# Patient Record
Sex: Male | Born: 1960 | Smoking: Former smoker
Health system: Southern US, Community
[De-identification: ages and names within clinical notes are randomized; demographics above are authoritative.]

## PROBLEM LIST (undated history)

## (undated) DIAGNOSIS — M21372 Foot drop, left foot: Secondary | ICD-10-CM

## (undated) DIAGNOSIS — D6851 Activated protein C resistance: Secondary | ICD-10-CM

## (undated) DIAGNOSIS — J449 Chronic obstructive pulmonary disease, unspecified: Secondary | ICD-10-CM

## (undated) DIAGNOSIS — G8929 Other chronic pain: Secondary | ICD-10-CM

## (undated) DIAGNOSIS — M519 Unspecified thoracic, thoracolumbar and lumbosacral intervertebral disc disorder: Secondary | ICD-10-CM

## (undated) DIAGNOSIS — J309 Allergic rhinitis, unspecified: Secondary | ICD-10-CM

## (undated) DIAGNOSIS — M5416 Radiculopathy, lumbar region: Secondary | ICD-10-CM

## (undated) DIAGNOSIS — E785 Hyperlipidemia, unspecified: Secondary | ICD-10-CM

## (undated) DIAGNOSIS — J45909 Unspecified asthma, uncomplicated: Secondary | ICD-10-CM

## (undated) HISTORY — DX: Radiculopathy, lumbar region: M54.16

## (undated) HISTORY — DX: Hyperlipidemia, unspecified: E78.5

## (undated) HISTORY — DX: Unspecified asthma, uncomplicated: J45.909

## (undated) HISTORY — DX: Unspecified thoracic, thoracolumbar and lumbosacral intervertebral disc disorder: M51.9

## (undated) HISTORY — DX: Foot drop, left foot: M21.372

## (undated) HISTORY — DX: Activated protein C resistance: D68.51

## (undated) HISTORY — DX: Allergic rhinitis, unspecified: J30.9

## (undated) HISTORY — DX: Chronic obstructive pulmonary disease, unspecified: J44.9

## (undated) HISTORY — DX: Other chronic pain: G89.29

---

## 2010-12-31 HISTORY — PX: SPINE SURGERY: SHX786

## 2011-11-29 ENCOUNTER — Encounter: Payer: Self-pay | Admitting: Physical Medicine & Rehabilitation

## 2011-12-11 ENCOUNTER — Ambulatory Visit (HOSPITAL_BASED_OUTPATIENT_CLINIC_OR_DEPARTMENT_OTHER): Payer: Worker's Compensation | Admitting: Physical Medicine & Rehabilitation

## 2011-12-11 ENCOUNTER — Encounter: Payer: Worker's Compensation | Attending: Physical Medicine & Rehabilitation

## 2011-12-11 ENCOUNTER — Encounter: Payer: Self-pay | Admitting: Physical Medicine & Rehabilitation

## 2011-12-11 VITALS — BP 119/69 | HR 72 | Ht 68.0 in | Wt 177.4 lb

## 2011-12-11 DIAGNOSIS — M961 Postlaminectomy syndrome, not elsewhere classified: Secondary | ICD-10-CM | POA: Insufficient documentation

## 2011-12-11 DIAGNOSIS — M5126 Other intervertebral disc displacement, lumbar region: Secondary | ICD-10-CM | POA: Insufficient documentation

## 2011-12-11 DIAGNOSIS — M79609 Pain in unspecified limb: Secondary | ICD-10-CM | POA: Insufficient documentation

## 2011-12-11 MED ORDER — TRAMADOL HCL 50 MG PO TABS: 100.0000 mg | ORAL_TABLET | Freq: Three times a day (TID) | ORAL | Status: AC | PRN

## 2011-12-11 MED ORDER — TRAMADOL HCL 50 MG PO TABS: 100.0000 mg | ORAL_TABLET | Freq: Three times a day (TID) | ORAL | Status: DC | PRN

## 2011-12-11 MED ORDER — GABAPENTIN 100 MG PO CAPS: 100.0000 mg | ORAL_CAPSULE | Freq: Three times a day (TID) | ORAL | Status: DC

## 2011-12-11 NOTE — Patient Instructions (Signed)
I have made a referral to physical therapy. His will need to be approved by workers compensation. The location will be at [facility] We will try tramadol this would be in place of oxycodone. We will try gabapentin for leg pain

## 2011-12-11 NOTE — Addendum Note (Signed)
Addended by: Caryl Ada on: 12/11/2011 02:11 PM   Modules accepted: Orders

## 2011-12-11 NOTE — Addendum Note (Signed)
Addended by: Judd Gaudier on: 12/11/2011 11:48 AM   Modules accepted: Orders

## 2011-12-11 NOTE — Progress Notes (Addendum)
Subjective:    Patient ID: Cody Grant, male    DOB: [birth year], 51 y.o.   MRN: [id number]  HPI 8 a work related injury July of 2011. Back pain and left leg pain and left foot numbness. Tried epidural injections x3 prior to surgery without significant relief. When onto L3-L4 discectomy in September of 2012. Postoperatively his back pain got better however his left leg pain became more severe and more constant. Has tried Lyrica as well as amitriptyline. Takes 0-2 oxycodone per day. Has had relief with physical therapy in the past.  Prior to moving to [place] had another order for physical therapy from his physician in [place]. Patient is now residing in [place] permanently. Last MRI of the lumbar spine was 07/19/2011 this was reviewed showing postsurgical changes with enhancing granulation tissue around the left L3 nerve root, in addition there was a left foraminal disc protrusion at L5-S1 level which was unchanged prior to a study dated November 08 2010 EMG/NCV dated 01/18/2010 demonstrated L5-S1 radiculopathy on the report however it failed to demonstrate to limb muscles a different peripheral nerve distribution with similar findings. Therefore he did not meet AAEM criteria Cody Grant viewed records including the diagnostic testing noted above. There was no FCE included with the packet Pain Inventory Average Pain 5 Pain Right Now 6 My pain is stabbing, tingling, aching and varies b/t constant and intermittent  In the last 24 hours, has pain interfered with the following? General activity 4 Relation with others 4 Enjoyment of life 4 What TIME of day is your pain at its worst? morning and evening Sleep (in general) Fair  Pain is worse with: bending, sitting and standing Pain improves with: rest, heat/ice and medication Relief from Meds: 6  Mobility walk without assistance how many minutes can you walk? 20 ability to climb steps?  yes do you drive?  yes  Function disabled: date  disabled 2012 I need assistance with the following:  meal prep, household duties and shopping  Neuro/Psych numbness tingling  Prior Studies Any changes since last visit?  no  Physicians involved in your care Any changes since last visit?  no   Family History  Problem Relation Age of Onset  . COPD Mother   . Diabetes Mother    History   Social History  . Marital Status: Unknown    Spouse Name: N/A    Number of Children: N/A  . Years of Education: N/A   Social History Main Topics  . Smoking status: Current Everyday Smoker -- 1.0 packs/day for 30 years    Types: Cigarettes  . Smokeless tobacco: Never Used  . Alcohol Use: None  . Drug Use: None  . Sexually Active: None   Other Topics Concern  . None   Social History Narrative  . None   Past Surgical History  Procedure Date  . Spine surgery 12/2010    micro diskectomy   Past Medical History  Diagnosis Date  . Asthma   . Factor 5 Leiden mutation, heterozygous   . Clotting disorder     "has 1 count of factor 5 "   BP 119/69  Pulse 72  Ht 5\' 8"  (1.727 m)  Wt 177 lb 6.4 oz (80.468 kg)  BMI 26.97 kg/m2  SpO2 97%   Review of Systems  Respiratory: Positive for cough, shortness of breath and wheezing.   Musculoskeletal: Positive for back pain.       Hip pain  Neurological: Positive for numbness.  Tingling  All other systems reviewed and are negative.       Objective:   Physical Exam  Constitutional: He is oriented to person, place, and time. He appears well-developed and well-nourished.  HENT:  Head: Normocephalic and atraumatic.  Eyes: Conjunctivae are normal. Pupils are equal, round, and reactive to light.  Musculoskeletal:       Pain in the left sacroiliac joint with left hip range of motion  Neurological: He is alert and oriented to person, place, and time. He displays abnormal reflex. A cranial nerve deficit and sensory deficit is present. Coordination and gait abnormal.  Reflex Scores:       Tricep reflexes are 2+ on the right side and 2+ on the left side.      Bicep reflexes are 2+ on the right side and 2+ on the left side.      Brachioradialis reflexes are 2+ on the right side and 2+ on the left side.      Patellar reflexes are 2+ on the right side and 2+ on the left side.      Achilles reflexes are 2+ on the right side and 2+ on the left side.      Decreased sensation to pinprick left L5 and S1          Assessment & Plan:  #1. Lumbar postlaminectomy syndrome. The major problem is with nerve related pain in the left leg and foot. Has tried amitriptyline as well as Lyrica for this. Has not tried gabapentin. I do think that this would be a good choice for a trial. I also agree with physical therapy and I will make a referral [facility].also the patient has not been taking much oxycodone. I think it's reasonable to trial him on tramadol. We'll check urine drug screen in the event that we need to resort to narcotic analgesics. We also discussed the possibility of spinal cord stimulation trial. Certainly this is a reasonable option. We'll give the patient further information. If he fails the above we may consider this.  No further diagnostic testing needed at this time

## 2011-12-18 ENCOUNTER — Ambulatory Visit: Payer: Worker's Compensation | Admitting: Physical Therapy

## 2011-12-20 ENCOUNTER — Ambulatory Visit: Payer: Worker's Compensation | Attending: Physical Medicine & Rehabilitation | Admitting: Physical Therapy

## 2011-12-20 DIAGNOSIS — M545 Low back pain, unspecified: Secondary | ICD-10-CM | POA: Insufficient documentation

## 2011-12-20 DIAGNOSIS — M79609 Pain in unspecified limb: Secondary | ICD-10-CM | POA: Insufficient documentation

## 2011-12-20 DIAGNOSIS — IMO0001 Reserved for inherently not codable concepts without codable children: Secondary | ICD-10-CM | POA: Insufficient documentation

## 2011-12-27 ENCOUNTER — Ambulatory Visit: Payer: Worker's Compensation | Admitting: Physical Therapy

## 2011-12-28 ENCOUNTER — Ambulatory Visit (HOSPITAL_BASED_OUTPATIENT_CLINIC_OR_DEPARTMENT_OTHER): Payer: Worker's Compensation | Admitting: Physical Medicine & Rehabilitation

## 2011-12-28 ENCOUNTER — Encounter: Payer: Self-pay | Admitting: Physical Medicine & Rehabilitation

## 2011-12-28 VITALS — BP 126/81 | HR 91 | Resp 14 | Ht 68.0 in | Wt 173.0 lb

## 2011-12-28 DIAGNOSIS — M961 Postlaminectomy syndrome, not elsewhere classified: Secondary | ICD-10-CM

## 2011-12-28 NOTE — Patient Instructions (Signed)
Gabapentin 100mg  po twice a day for a week then 3 times a day  Look at Spinal cord stim DVD  Continue tramadol two tabs twice a day  Try ibuprofen 1 600mg  with one 200mg   , avoid taking everyday  See me in 45month

## 2011-12-28 NOTE — Progress Notes (Signed)
Subjective:    Patient ID: Cody Grant, male    DOB: 06-29-1960, 51 y.o.   MRN: [id number] work related injury July of 2011. Back pain and left leg pain and left foot numbness. Tried epidural injections x3 prior to surgery without significant relief. When onto L3-L4 discectomy in September of 2012. Postoperatively his back pain got better however his left leg pain became more severe and more constant.  Has tried Lyrica as well as amitriptyline. Takes 0-2 oxycodone per day.  Has had relief with physical therapy in the past. Prior to moving to [place] had another order for physical therapy from his physician in [place].  Patient is now residing in [place] permanently.  Last MRI of the lumbar spine was 07/19/2011 this was reviewed showing postsurgical changes with enhancing granulation tissue around the left L3 nerve root, in addition there was a left foraminal disc protrusion at L5-S1 level which was unchanged prior to a study dated November 08 2010  EMG/NCV dated 01/18/2010 demonstrated L5-S1 radiculopathy on the report however it failed to demonstrate to limb muscles a different peripheral nerve distribution with similar findings. Therefore he did not meet AAEM criteria  Gardiner Rhyme viewed records including the diagnostic testing noted above. There was no FCE included with the packet  HPI Patient did not start his Neurontin until 2 days ago. He was treated for respiratory infection was taking multiple medications . Current Neurontin makes him feel a bit drowsy however the pain has improved with that. I instructed him to take the Neurontin 3 times a day for the first week and then increase to 3 times a day.He has started physical therapy has had a couple visits.so far the therapy has been helpful Pain Inventory Average Pain 5 Pain Right Now 6 My pain is stabbing, tingling and aching  In the last 24 hours, has pain interfered with the following? General activity 6 Relation with others 5 Enjoyment of  life 7 What TIME of day is your pain at its worst? evening Sleep (in general) Poor  Pain is worse with: bending, sitting and standing Pain improves with: rest, heat/ice, therapy/exercise, pacing activities, medication and TENS Relief from Meds: 5  Mobility walk without assistance how many minutes can you walk? 15 ability to climb steps?  yes do you drive?  yes  Function not employed: date last employed 11/16/09 disabled: date disabled 05/19/2010 I need assistance with the following:  dressing, meal prep and household duties Do you have any goals in this area?  no  Neuro/Psych numbness tingling  Prior Studies Any changes since last visit?  no  Physicians involved in your care Any changes since last visit?  no   Family History  Problem Relation Age of Onset  . COPD Mother   . Diabetes Mother    History   Social History  . Marital Status: Unknown    Spouse Name: N/A    Number of Children: N/A  . Years of Education: N/A   Social History Main Topics  . Smoking status: Current Everyday Smoker -- 1.0 packs/day for 30 years    Types: Cigarettes  . Smokeless tobacco: Never Used  . Alcohol Use: None  . Drug Use: None  . Sexually Active: None   Other Topics Concern  . None   Social History Narrative  . None   Past Surgical History  Procedure Date  . Spine surgery 12/2010    micro diskectomy   Past Medical History  Diagnosis Date  . Asthma   .  Factor 5 Leiden mutation, heterozygous   . Clotting disorder     "has 1 count of factor 5 "   BP 126/81  Pulse 91  Resp 14  Ht 5\' 8"  (1.727 m)  Wt 173 lb (78.472 kg)  BMI 26.30 kg/m2  SpO2 95%     Review of Systems  Respiratory: Positive for wheezing.   Musculoskeletal: Positive for myalgias, back pain and arthralgias.  Neurological: Positive for numbness.  All other systems reviewed and are negative.       Objective:   Physical Exam  Physical Exam  Constitutional: He is oriented to person, place,  and time. He appears well-developed and well-nourished.  HENT:  Head: Normocephalic and atraumatic.  Eyes: Conjunctivae are normal. Pupils are equal, round, and reactive to light.  Musculoskeletal:  No tenderness to palpation in the lumbar spine. Well-healed incision Neurological: He is alert and oriented to person, place, and time. He displays abnormal reflex. A cranial nerve deficit and sensory deficit is present. Coordination and gait abnormal.  Reflex Scores:  Tricep reflexes are 2+ on the right side and 2+ on the left side.  Bicep reflexes are 2+ on the right side and 2+ on the left side.  Brachioradialis reflexes are 2+ on the right side and 2+ on the left side.  Patellar reflexes are 2+ on the right side and 2+ on the left side.  Achilles reflexes are 2+ on the right side and 2+ on the left side. Decreased sensation to pinprick left L5 and S1        Assessment & Plan:  #1. Lumbar postlaminectomy syndrome. The major problem is with nerve related pain in the left leg and foot. Has tried amitriptyline as well as Lyrica for this. Has not tried gabapentin. I do think that this would be a good choice for a trial. I also agree with physical therapy and I will make a referral [facility].also the patient has not been taking much oxycodone. I think it's reasonable to trial him on tramadol. We'll check urine drug screen in the event that we need to resort to narcotic analgesics.  We also discussed the possibility of spinal cord stimulation trial. Certainly this is a reasonable option. We'll give the patient further information. If he fails the above we may consider this.  No further diagnostic testing needed at this time Surgery considered the L5-S1 protrusion however stated it was improved. The patient has had chronic nerve compression so decompression unlikely to be of benefit. DVT for spinal cord stimulation given to patient today Reduce Neurontin to twice a day for 1 week then increase to  3 times a day. We may increase his nighttime dose at some point Continue tramadol 2 by mouth twice a day Trial of ibuprofen 800 mg on a when necessary basis I advised not to take on a daily basis Continue physical therapy I'll see the patient back in one month

## 2011-12-29 ENCOUNTER — Ambulatory Visit: Payer: Worker's Compensation | Admitting: Physical Therapy

## 2012-01-03 ENCOUNTER — Ambulatory Visit: Payer: Worker's Compensation | Attending: Physical Medicine & Rehabilitation | Admitting: Physical Therapy

## 2012-01-03 DIAGNOSIS — M79609 Pain in unspecified limb: Secondary | ICD-10-CM | POA: Insufficient documentation

## 2012-01-03 DIAGNOSIS — IMO0001 Reserved for inherently not codable concepts without codable children: Secondary | ICD-10-CM | POA: Insufficient documentation

## 2012-01-03 DIAGNOSIS — M545 Low back pain, unspecified: Secondary | ICD-10-CM | POA: Insufficient documentation

## 2012-01-05 ENCOUNTER — Ambulatory Visit: Payer: Worker's Compensation | Admitting: Physical Therapy

## 2012-01-10 ENCOUNTER — Ambulatory Visit: Payer: Worker's Compensation | Admitting: Physical Therapy

## 2012-01-11 ENCOUNTER — Encounter: Payer: Worker's Compensation | Admitting: Physical Therapy

## 2012-01-12 ENCOUNTER — Ambulatory Visit: Payer: Worker's Compensation | Admitting: Physical Therapy

## 2012-01-16 ENCOUNTER — Ambulatory Visit: Payer: Worker's Compensation | Admitting: Physical Therapy

## 2012-01-17 ENCOUNTER — Ambulatory Visit: Payer: Worker's Compensation | Admitting: Physical Therapy

## 2012-01-19 ENCOUNTER — Ambulatory Visit: Payer: Worker's Compensation | Admitting: Physical Therapy

## 2012-01-24 ENCOUNTER — Ambulatory Visit: Payer: Worker's Compensation | Admitting: Physical Therapy

## 2012-01-26 ENCOUNTER — Encounter: Payer: Worker's Compensation | Attending: Physical Medicine & Rehabilitation

## 2012-01-26 ENCOUNTER — Ambulatory Visit: Payer: Worker's Compensation | Admitting: Physical Therapy

## 2012-01-26 ENCOUNTER — Ambulatory Visit (HOSPITAL_BASED_OUTPATIENT_CLINIC_OR_DEPARTMENT_OTHER): Payer: Worker's Compensation | Admitting: Physical Medicine & Rehabilitation

## 2012-01-26 ENCOUNTER — Encounter: Payer: Self-pay | Admitting: Physical Medicine & Rehabilitation

## 2012-01-26 VITALS — HR 72 | Resp 14 | Ht 68.0 in | Wt 171.0 lb

## 2012-01-26 DIAGNOSIS — M79609 Pain in unspecified limb: Secondary | ICD-10-CM | POA: Insufficient documentation

## 2012-01-26 DIAGNOSIS — M5126 Other intervertebral disc displacement, lumbar region: Secondary | ICD-10-CM | POA: Insufficient documentation

## 2012-01-26 DIAGNOSIS — M961 Postlaminectomy syndrome, not elsewhere classified: Secondary | ICD-10-CM | POA: Insufficient documentation

## 2012-01-26 MED ORDER — TRAMADOL HCL 50 MG PO TABS: 100.0000 mg | ORAL_TABLET | Freq: Three times a day (TID) | ORAL | Status: DC | PRN

## 2012-01-26 MED ORDER — IBUPROFEN 400 MG PO TABS: 400.0000 mg | ORAL_TABLET | Freq: Three times a day (TID) | ORAL | Status: DC | PRN

## 2012-01-26 NOTE — Patient Instructions (Signed)
Continue your home physical therapy exercise program Ibuprofen 400 mg 3 times per day as needed with food, if this is not helpful we may have to go up to the 600s Increase her tramadol to 2 tablets 3 times per day. I will need to get results of your urine drug screen prior to prescribing anything stronger than this

## 2012-01-26 NOTE — Progress Notes (Signed)
Subjective:    Patient ID: Cody Grant, male    DOB: 1960-06-22, 51 y.o.   MRN: [id number]  HPI Physical therapy did not reduce his pain scores however he felt a little bit better after doing it. He states that in [place] he alternating ibuprofen with hydrocodone or oxycodone. He did not take either medicine on a daily basis. He is currently taking ibuprofen. He has tried tramadol which was not particularly helpful for him We had a urine drug screen ordered last visit but this was not obtained although the patient states that he did give a sample last time He states he drinks about 3 beers per day. I counseled him to reduce that. If we are to prescribe narcotics for this patient he will not be allowed to drink. This is because of a negative interaction. Pain Inventory Average Pain 5 Pain Right Now 6 My pain is constant, stabbing and tingling  In the last 24 hours, has pain interfered with the following? General activity 6 Relation with others 4 Enjoyment of life 6 What TIME of day is your pain at its worst? All Day Sleep (in general) Fair  Pain is worse with: unsure Pain improves with: Nothing Relief from Meds: 0  Mobility walk without assistance  Function disabled: date disabled 2012 I need assistance with the following:  household duties  Neuro/Psych numbness tingling  Prior Studies Any changes since last visit?  no  Physicians involved in your care Any changes since last visit?  no   Family History  Problem Relation Age of Onset  . COPD Mother   . Diabetes Mother    History   Social History  . Marital Status: Unknown    Spouse Name: N/A    Number of Children: N/A  . Years of Education: N/A   Social History Main Topics  . Smoking status: Current Every Day Smoker -- 1.0 packs/day for 30 years    Types: Cigarettes  . Smokeless tobacco: Never Used  . Alcohol Use: None  . Drug Use: None  . Sexually Active: None   Other Topics Concern  . None    Social History Narrative  . None   Past Surgical History  Procedure Date  . Spine surgery 12/2010    micro diskectomy   Past Medical History  Diagnosis Date  . Asthma   . Factor 5 Leiden mutation, heterozygous   . Clotting disorder     "has 1 count of factor 5 "   Pulse 72  Resp 14  Ht 5\' 8"  (1.727 m)  Wt 171 lb (77.565 kg)  BMI 26.00 kg/m2  SpO2 97%      Review of Systems  Constitutional: Negative.   HENT: Negative.   Eyes: Negative.   Respiratory: Negative.   Cardiovascular: Negative.   Gastrointestinal: Negative.   Genitourinary: Negative.   Musculoskeletal: Negative.   Skin: Negative.   Neurological: Positive for numbness.  Hematological: Negative.   Psychiatric/Behavioral: Negative.        Objective:   Physical Exam  Constitutional: He is oriented to person, place, and time. He appears well-developed and well-nourished.  Musculoskeletal:       Lumbar back: He exhibits decreased range of motion.       No tenderness with Faber's maneuver  Neurological: He is alert and oriented to person, place, and time. He displays no atrophy. A sensory deficit is present. He exhibits normal muscle tone. Gait normal.  Reflex Scores:      Tricep reflexes  are 2+ on the right side and 2+ on the left side.      Bicep reflexes are 2+ on the right side and 2+ on the left side.      Brachioradialis reflexes are 2+ on the right side and 2+ on the left side.      Patellar reflexes are 2+ on the right side and 2+ on the left side.      Achilles reflexes are 2+ on the right side and 2+ on the left side.      Decreased left L5 dermatome to pinprick  Psychiatric: He has a normal mood and affect.          Assessment & Plan:  1. Lumbar post laminectomy syndrome Status post L3-L4 laminectomy and discectomy.  Has some evidence of L5 radiculopathy as well. Left foraminal disc protrusion which is known. No surgical intervention was recommended. Patient requesting narcotic  analgesics will need to check urine drug screen. Will need to counsel on alcohol cessation if we are to prescribe.  In the meantime tramadol 100 g 3 times a day Ibuprofen 400 mg 3 times a day when necessary with food Once again discussed spinal cord stimulator he declines

## 2012-01-31 ENCOUNTER — Encounter: Payer: Worker's Compensation | Admitting: Physical Therapy

## 2012-02-20 ENCOUNTER — Ambulatory Visit (HOSPITAL_BASED_OUTPATIENT_CLINIC_OR_DEPARTMENT_OTHER): Payer: Worker's Compensation | Admitting: Physical Medicine & Rehabilitation

## 2012-02-20 ENCOUNTER — Encounter: Payer: Worker's Compensation | Attending: Physical Medicine & Rehabilitation

## 2012-02-20 ENCOUNTER — Encounter: Payer: Self-pay | Admitting: Physical Medicine & Rehabilitation

## 2012-02-20 DIAGNOSIS — M5126 Other intervertebral disc displacement, lumbar region: Secondary | ICD-10-CM | POA: Insufficient documentation

## 2012-02-20 DIAGNOSIS — M79609 Pain in unspecified limb: Secondary | ICD-10-CM | POA: Insufficient documentation

## 2012-02-20 DIAGNOSIS — M961 Postlaminectomy syndrome, not elsewhere classified: Secondary | ICD-10-CM

## 2012-02-20 MED ORDER — HYDROCODONE-ACETAMINOPHEN 5-325 MG PO TABS: 1.0000 | ORAL_TABLET | Freq: Two times a day (BID) | ORAL | Status: DC

## 2012-02-20 NOTE — Patient Instructions (Addendum)
No drinking Alcohol with the hydrocodone If The urine drug screen Detects alcohol use and You will be discharged from our practice

## 2012-02-20 NOTE — Progress Notes (Signed)
Subjective:    Patient ID: Cody Grant, male    DOB: November 16, 1960, 51 y.o.   MRN: [id number]  HPI Physical therapy did not reduce his pain scores however he felt a little bit better after doing it. He states that in [place] he alternating ibuprofen with hydrocodone or oxycodone. He did not take either medicine on a daily basis. He is currently taking ibuprofen. He has tried tramadol which was not particularly helpful for him We had a urine drug screen ordered last visit but this was not obtained although the patient states that he did give a sample last time He states he drinks about 3 beers per day. If we are to prescribe narcotics for this patient he will not be allowed to drink. This is because of a negative interaction.He understands this Pain Inventory Average Pain 5 Pain Right Now 7 My pain is sharp, stabbing and tingling  In the last 24 hours, has pain interfered with the following? General activity 6 Relation with others 4 Enjoyment of life 6 What TIME of day is your pain at its worst? morning and evening Sleep (in general) Poor  Pain is worse with: bending, sitting and standing Pain improves with: heat/ice, medication and TENS Relief from Meds: 7  Mobility walk without assistance how many minutes can you walk? 20 ability to climb steps?  yes do you drive?  yes  Function Disabled 05/2010 Needs assistance with dressing, meal prep, and household duties  Neuro/Psych weakness numbness tingling  Prior Studies Any changes since last visit?  no  Physicians involved in your care Any changes since last visit?  no   Family History  Problem Relation Age of Onset  . COPD Mother   . Diabetes Mother    History   Social History  . Marital Status: Unknown    Spouse Name: N/A    Number of Children: N/A  . Years of Education: N/A   Social History Main Topics  . Smoking status: Current Every Day Smoker -- 1.0 packs/day for 30 years    Types: Cigarettes  .  Smokeless tobacco: Never Used  . Alcohol Use: None  . Drug Use: None  . Sexually Active: None   Other Topics Concern  . None   Social History Narrative  . None   Past Surgical History  Procedure Date  . Spine surgery 12/2010    micro diskectomy   Past Medical History  Diagnosis Date  . Asthma   . Factor 5 Leiden mutation, heterozygous   . Clotting disorder     "has 1 count of factor 5 "   There were no vitals taken for this visit.     Review of Systems  Neurological: Positive for weakness and numbness.  All other systems reviewed and are negative.       Objective:   Physical Exam  Constitutional: He is oriented to person, place, and time.  Neurological: He is alert and oriented to person, place, and time. He has normal strength. A sensory deficit is present. Gait normal.  Reflex Scores:      Patellar reflexes are 1+ on the right side and 2+ on the left side.      Achilles reflexes are 2+ on the right side and 2+ on the left side.      decrease sensation in the left L4, L5, S1 dermatomes          Assessment & Plan:  1. Lumbar post laminectomy syndrome Status post L3-L4 laminectomy and  discectomy. Has some evidence of L5 radiculopathy as well. Left foraminal disc protrusion which is known. No surgical intervention was recommended. Once again discussed spinal cord stimulator he declines Will trial hydrocodone 5 mg 3 times per day. See the patient back in 6 weeks. Opioid consent form-Repeat urine drug screen in the future  If positive for alcohol or other illicit drugs will discharge from clinic

## 2012-03-27 ENCOUNTER — Other Ambulatory Visit: Payer: Self-pay | Admitting: Physical Medicine & Rehabilitation

## 2012-04-01 MED ORDER — HYDROCODONE-ACETAMINOPHEN 5-325 MG PO TABS
1.0000 | ORAL_TABLET | Freq: Two times a day (BID) | ORAL | Status: DC
Start: 2012-04-01 — End: 2012-04-02

## 2012-04-02 ENCOUNTER — Ambulatory Visit (HOSPITAL_BASED_OUTPATIENT_CLINIC_OR_DEPARTMENT_OTHER): Payer: Worker's Compensation | Admitting: Physical Medicine & Rehabilitation

## 2012-04-02 ENCOUNTER — Encounter: Payer: Worker's Compensation | Attending: Physical Medicine & Rehabilitation

## 2012-04-02 ENCOUNTER — Encounter: Payer: Self-pay | Admitting: Physical Medicine & Rehabilitation

## 2012-04-02 VITALS — BP 134/75 | HR 84 | Resp 14 | Ht 68.0 in | Wt 180.0 lb

## 2012-04-02 DIAGNOSIS — M549 Dorsalgia, unspecified: Secondary | ICD-10-CM

## 2012-04-02 DIAGNOSIS — M79609 Pain in unspecified limb: Secondary | ICD-10-CM | POA: Insufficient documentation

## 2012-04-02 DIAGNOSIS — Z5181 Encounter for therapeutic drug level monitoring: Secondary | ICD-10-CM

## 2012-04-02 DIAGNOSIS — M961 Postlaminectomy syndrome, not elsewhere classified: Secondary | ICD-10-CM | POA: Insufficient documentation

## 2012-04-02 DIAGNOSIS — M5126 Other intervertebral disc displacement, lumbar region: Secondary | ICD-10-CM | POA: Insufficient documentation

## 2012-04-02 MED ORDER — HYDROCODONE-ACETAMINOPHEN 5-325 MG PO TABS: 1.0000 | ORAL_TABLET | Freq: Two times a day (BID) | ORAL | Status: DC

## 2012-04-02 NOTE — Progress Notes (Signed)
Subjective:    Patient ID: Cody Grant, male    DOB: 1960-09-25, 51 y.o.   MRN: [id number]  HPI Walks every other day maybe 15-20 min  Pain Inventory Average Pain 6 Pain Right Now 7 My pain is sharp, stabbing and tingling  In the last 24 hours, has pain interfered with the following? General activity 7 Relation with others 4 Enjoyment of life 5 What TIME of day is your pain at its worst? evening Sleep (in general) Fair  Pain is worse with: bending, sitting and standing Pain improves with: rest, heat/ice, pacing activities, medication and TENS Relief from Meds: 7  Mobility walk without assistance how many minutes can you walk? 30 ability to climb steps?  yes do you drive?  yes transfers alone Do you have any goals in this area?  no  Function disabled: date disabled 1/12 I need assistance with the following:  meal prep, household duties and shopping Do you have any goals in this area?  no  Neuro/Psych numbness tingling  Prior Studies Any changes since last visit?  no  Physicians involved in your care Any changes since last visit?  no   Family History  Problem Relation Age of Onset  . COPD Mother   . Diabetes Mother    History   Social History  . Marital Status: Unknown    Spouse Name: N/A    Number of Children: N/A  . Years of Education: N/A   Social History Main Topics  . Smoking status: Current Every Day Smoker -- 1.0 packs/day for 30 years    Types: Cigarettes  . Smokeless tobacco: Never Used  . Alcohol Use: None  . Drug Use: None  . Sexually Active: None   Other Topics Concern  . None   Social History Narrative  . None   Past Surgical History  Procedure Date  . Spine surgery 12/2010    micro diskectomy   Past Medical History  Diagnosis Date  . Asthma   . Factor 5 Leiden mutation, heterozygous   . Clotting disorder     "has 1 count of factor 5 "   BP 134/75  Pulse 84  Resp 14  Ht 5\' 8"  (1.727 m)  Wt 180 lb (81.647 kg)  BMI  27.37 kg/m2  SpO2 97%     Review of Systems  Musculoskeletal: Positive for back pain.  Neurological: Positive for numbness.  All other systems reviewed and are negative.       Objective:   Physical Exam  Constitutional: He appears well-developed and well-nourished.    Ambulation without evidence of toe drag or knee instability Neurological: He is alert and oriented to person, place, and time. He has normal strength. A sensory deficit is present. Gait normal.  Reflex Scores:  Patellar reflexes are 1+ on the right side and 2+ on the left side.  Achilles reflexes are 2+ on the right side and 2+ on the left side. decrease sensation in the left L4, L5, S1 dermatomes        Assessment & Plan:  1. Lumbar post laminectomy syndrome Status post L3-L4 laminectomy and discectomy. Has some evidence of L5 radiculopathy as well. Left foraminal disc protrusion which is known. No surgical intervention was recommended by the surgeon that evaluated him in [place]. Recommend ambulation 30 minutes everyday  hydrocodone 5 mg 3 times per day.He actually takes this medication sporadically, Some days 0 pills some days 4 pills. See the patient back in 6 weeks. Opioid consent  form-Repeat urine drug screen  If positive for alcohol or other illicit drugs will discharge from clinic

## 2012-04-02 NOTE — Patient Instructions (Addendum)
Walk 30 min a day Cont Hydrocodone and Advil

## 2012-04-08 ENCOUNTER — Other Ambulatory Visit: Payer: Self-pay | Admitting: Physical Medicine and Rehabilitation

## 2012-04-11 ENCOUNTER — Other Ambulatory Visit: Payer: Self-pay | Admitting: Physical Medicine and Rehabilitation

## 2012-04-11 ENCOUNTER — Encounter: Payer: Self-pay | Admitting: Physical Medicine and Rehabilitation

## 2012-05-13 ENCOUNTER — Other Ambulatory Visit: Payer: Self-pay | Admitting: Physical Medicine & Rehabilitation

## 2012-05-14 ENCOUNTER — Ambulatory Visit (HOSPITAL_BASED_OUTPATIENT_CLINIC_OR_DEPARTMENT_OTHER): Payer: Worker's Compensation | Admitting: Physical Medicine & Rehabilitation

## 2012-05-14 ENCOUNTER — Encounter: Payer: Worker's Compensation | Attending: Physical Medicine & Rehabilitation

## 2012-05-14 ENCOUNTER — Encounter: Payer: Self-pay | Admitting: Physical Medicine & Rehabilitation

## 2012-05-14 VITALS — BP 124/75 | HR 67 | Resp 14 | Ht 68.0 in | Wt 182.0 lb

## 2012-05-14 DIAGNOSIS — M79609 Pain in unspecified limb: Secondary | ICD-10-CM | POA: Insufficient documentation

## 2012-05-14 DIAGNOSIS — M961 Postlaminectomy syndrome, not elsewhere classified: Secondary | ICD-10-CM | POA: Diagnosis not present

## 2012-05-14 DIAGNOSIS — M5126 Other intervertebral disc displacement, lumbar region: Secondary | ICD-10-CM | POA: Insufficient documentation

## 2012-05-14 MED ORDER — HYDROCODONE-ACETAMINOPHEN 5-325 MG PO TABS: 1.0000 | ORAL_TABLET | Freq: Two times a day (BID) | ORAL | Status: DC

## 2012-05-14 NOTE — Patient Instructions (Signed)
Cody Grant to show exercises in 5 weeks

## 2012-05-14 NOTE — Telephone Encounter (Signed)
Refill printed at appointment 05/14/12

## 2012-05-14 NOTE — Progress Notes (Signed)
Subjective:    Patient ID: Cody Grant, male    DOB: 11/01/1960, 52 y.o.   MRN: [id number]  HPI Larey Seat several weeks ago with aggravation of low back pain. No pain shooting into the leg. There is some thigh pain. No bowel or bladder difficulties. Continues on his current pain medications. Hydrocodone 5 mg twice a day Pain Inventory Average Pain 6 Pain Right Now 7 My pain is sharp, stabbing and tingling  In the last 24 hours, has pain interfered with the following? General activity 7 Relation with others 5 Enjoyment of life 6 What TIME of day is your pain at its worst? morning and night Sleep (in general) Fair  Pain is worse with: bending, sitting and standing Pain improves with: heat/ice, pacing activities, medication and TENS Relief from Meds: 7  Mobility walk without assistance how many minutes can you walk? 20 ability to climb steps?  yes do you drive?  yes transfers alone  Function not employed: date last employed 7/11 disabled: date disabled 1/12 I need assistance with the following:  meal prep, household duties and shopping  Neuro/Psych numbness tingling trouble walking  Prior Studies Any changes since last visit?  no  Physicians involved in your care Any changes since last visit?  no   Family History  Problem Relation Age of Onset  . COPD Mother   . Diabetes Mother    History   Social History  . Marital Status: Unknown    Spouse Name: N/A    Number of Children: N/A  . Years of Education: N/A   Social History Main Topics  . Smoking status: Current Every Day Smoker -- 1.0 packs/day for 30 years    Types: Cigarettes  . Smokeless tobacco: Never Used  . Alcohol Use: None  . Drug Use: None  . Sexually Active: None   Other Topics Concern  . None   Social History Narrative  . None   Past Surgical History  Procedure Date  . Spine surgery 12/2010    micro diskectomy   Past Medical History  Diagnosis Date  . Asthma   . Factor 5 Leiden  mutation, heterozygous   . Clotting disorder     "has 1 count of factor 5 "   BP 124/75  Pulse 67  Resp 14  Ht 5\' 8"  (1.727 m)  Wt 182 lb (82.555 kg)  BMI 27.67 kg/m2  SpO2 97%     Review of Systems  Musculoskeletal: Positive for gait problem.  Neurological: Positive for numbness.  All other systems reviewed and are negative.       Objective:   Physical Exam  Nursing note and vitals reviewed. Constitutional: He is oriented to person, place, and time. He appears well-developed and well-nourished.  HENT:  Head: Normocephalic and atraumatic.  Right Ear: External ear normal.  Left Ear: External ear normal.  Eyes: Conjunctivae normal and EOM are normal. Pupils are equal, round, and reactive to light.  Neck: Normal range of motion.  Musculoskeletal:       Lumbar back: He exhibits decreased range of motion, tenderness and pain. He exhibits no deformity and no spasm.  Neurological: He is alert and oriented to person, place, and time. He has normal strength and normal reflexes. Gait normal.  Psychiatric: He has a normal mood and affect.          Assessment & Plan:  1. Lumbar post laminectomy syndrome Status post L3-L4 laminectomy and discectomy. Has some evidence of L5 radiculopathy as well. Left  foraminal disc protrusion which is known. No surgical intervention was recommended by the surgeon that evaluated him in [place]. Recommend ambulation 30 minutes everyday  hydrocodone 5 mg 3 times per day.He actually takes this medication sporadically, Some days 0 pills some days 4 pills. See the patient back in 6 weeks. 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 PA visit in 5 weeks can review exercise. Pt has quadratus lumborum strain from fall that is improving

## 2012-06-15 ENCOUNTER — Other Ambulatory Visit: Payer: Self-pay

## 2012-06-18 ENCOUNTER — Encounter: Payer: Self-pay | Admitting: Physical Medicine & Rehabilitation

## 2012-06-18 ENCOUNTER — Ambulatory Visit (HOSPITAL_BASED_OUTPATIENT_CLINIC_OR_DEPARTMENT_OTHER): Payer: Worker's Compensation | Admitting: Physical Medicine & Rehabilitation

## 2012-06-18 ENCOUNTER — Encounter: Payer: Worker's Compensation | Attending: Physical Medicine & Rehabilitation

## 2012-06-18 VITALS — BP 124/60 | HR 106 | Resp 14 | Ht 68.0 in | Wt 182.0 lb

## 2012-06-18 DIAGNOSIS — M79609 Pain in unspecified limb: Secondary | ICD-10-CM | POA: Insufficient documentation

## 2012-06-18 DIAGNOSIS — M961 Postlaminectomy syndrome, not elsewhere classified: Secondary | ICD-10-CM | POA: Diagnosis not present

## 2012-06-18 DIAGNOSIS — M5126 Other intervertebral disc displacement, lumbar region: Secondary | ICD-10-CM | POA: Insufficient documentation

## 2012-06-18 MED ORDER — OXYCODONE HCL 5 MG PO TABS: 5.0000 mg | ORAL_TABLET | Freq: Three times a day (TID) | ORAL | Status: DC | PRN

## 2012-06-18 NOTE — Patient Instructions (Signed)
Try oxycodone this month rather than hydrocodone

## 2012-06-18 NOTE — Progress Notes (Signed)
Subjective:    Patient ID: Cody Grant, male    DOB: 03/25/1961, 52 y.o.   MRN: [id number]  HPI Increased numbness left foot started about 3 weeks ago. No fall or other trauma to his back or foot area No bowel or bladder issues. Pain Inventory Average Pain 6 Pain Right Now 7 My pain is sharp, stabbing, tingling and aching  In the last 24 hours, has pain interfered with the following? General activity 6 Relation with others 4 Enjoyment of life 6 What TIME of day is your pain at its worst? morning and night Sleep (in general) Fair  Pain is worse with: bending, sitting and standing Pain improves with: heat/ice, pacing activities, medication and TENS Relief from Meds: 7  Mobility walk without assistance how many minutes can you walk? 15 ability to climb steps?  yes do you drive?  yes transfers alone Do you have any goals in this area?  yes  Function disabled: date disabled 67 I need assistance with the following:  meal prep, household duties and shopping  Neuro/Psych numbness tingling  Prior Studies Any changes since last visit?  no  Physicians involved in your care Any changes since last visit?  no   Family History  Problem Relation Age of Onset  . COPD Mother   . Diabetes Mother    History   Social History  . Marital Status: Unknown    Spouse Name: N/A    Number of Children: N/A  . Years of Education: N/A   Social History Main Topics  . Smoking status: Current Every Day Smoker -- 1.00 packs/day for 30 years    Types: Cigarettes  . Smokeless tobacco: Never Used  . Alcohol Use: None  . Drug Use: None  . Sexually Active: None   Other Topics Concern  . None   Social History Narrative  . None   Past Surgical History  Procedure Laterality Date  . Spine surgery  12/2010    micro diskectomy   Past Medical History  Diagnosis Date  . Asthma   . Factor 5 Leiden mutation, heterozygous   . Clotting disorder     "has 1 count of factor 5 "   BP  124/60  Pulse 106  Resp 14  Ht 5\' 8"  (1.727 m)  Wt 182 lb (82.555 kg)  BMI 27.68 kg/m2  SpO2 94%     Review of Systems  Respiratory: Positive for cough and wheezing.   Musculoskeletal: Positive for back pain.  Neurological: Positive for numbness.  All other systems reviewed and are negative.       Objective:   Physical Exam  Nursing note and vitals reviewed.  Constitutional: He is oriented to person, place, and time. He appears well-developed and well-nourished.  HENT:  Head: Normocephalic and atraumatic.  Right Ear: External ear normal.  Left Ear: External ear normal.  Eyes: Conjunctivae normal and EOM are normal. Pupils are equal, round, and reactive to light.  Neck: Normal range of motion.  Musculoskeletal:  Lumbar back: He exhibits decreased range of motion, tenderness and pain. He exhibits no deformity and no spasm.  Neurological: He is alert and oriented to person, place, and time. He has normal strength and normal reflexes. Gait normal.  Psychiatric: He has a normal mood and affect.  L5 and S1 sensory deficit on left side only Maculopapular rash on the anterior and posterior leg below knee to the ankle left side only     Assessment & Plan:  1. Lumbar post  laminectomy syndrome Status post L3-L4 laminectomy and discectomy. Has some evidence of L5 radiculopathy as well. Left foraminal disc protrusion which is known. No surgical intervention was recommended by the surgeon that evaluated him in [place]. Recommend ambulation 30 minutes everyday Trial oxycodone 5 mg 3 times per day.He actually takes this medication sporadically, Some days 0 pills some days 4 pills. See the patient back in 6 weeks. 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 Left leg rash appears to be allergic.No generalized  rash or evidence of anaphylaxis. Was treated with some type of cream prescribed by dermatologist for several months ago recommend repeat check by a dermatologist. PA visit in future can review exercise.

## 2012-06-20 DIAGNOSIS — I872 Venous insufficiency (chronic) (peripheral): Secondary | ICD-10-CM | POA: Diagnosis not present

## 2012-07-11 DIAGNOSIS — I872 Venous insufficiency (chronic) (peripheral): Secondary | ICD-10-CM | POA: Diagnosis not present

## 2012-07-11 DIAGNOSIS — L570 Actinic keratosis: Secondary | ICD-10-CM | POA: Diagnosis not present

## 2012-07-15 ENCOUNTER — Encounter: Payer: Self-pay | Admitting: Physical Medicine & Rehabilitation

## 2012-07-15 ENCOUNTER — Ambulatory Visit (HOSPITAL_BASED_OUTPATIENT_CLINIC_OR_DEPARTMENT_OTHER): Payer: Worker's Compensation | Admitting: Physical Medicine & Rehabilitation

## 2012-07-15 ENCOUNTER — Encounter: Payer: Worker's Compensation | Attending: Physical Medicine & Rehabilitation

## 2012-07-15 VITALS — BP 118/91 | HR 96 | Resp 14 | Ht 68.0 in | Wt 178.0 lb

## 2012-07-15 DIAGNOSIS — M5126 Other intervertebral disc displacement, lumbar region: Secondary | ICD-10-CM | POA: Insufficient documentation

## 2012-07-15 DIAGNOSIS — M961 Postlaminectomy syndrome, not elsewhere classified: Secondary | ICD-10-CM | POA: Insufficient documentation

## 2012-07-15 DIAGNOSIS — M79609 Pain in unspecified limb: Secondary | ICD-10-CM | POA: Insufficient documentation

## 2012-07-15 MED ORDER — OXYCODONE HCL 5 MG PO TABS: 5.0000 mg | ORAL_TABLET | Freq: Three times a day (TID) | ORAL | Status: DC | PRN

## 2012-07-15 NOTE — Progress Notes (Signed)
Subjective:    Patient ID: Cody Grant, male    DOB: 1960-06-23, 52 y.o.   MRN: [id number]  HPI Foot numbness,Occurs when first awakening and then it goes away Slept poorly this last week No falls no trauma Pain Inventory Average Pain 6 Pain Right Now 5 My pain is sharp, stabbing and tingling  In the last 24 hours, has pain interfered with the following? General activity 6 Relation with others 4 Enjoyment of life 4 What TIME of day is your pain at its worst? evening and night Sleep (in general) Fair  Pain is worse with: walking, bending, sitting and standing Pain improves with: heat/ice, pacing activities, medication and TENS Relief from Meds: 7  Mobility walk without assistance how many minutes can you walk? 15 ability to climb steps?  yes do you drive?  yes transfers alone  Function disabled: date disabled 70 I need assistance with the following:  dressing, meal prep, household duties and shopping  Neuro/Psych weakness numbness tingling  Prior Studies Any changes since last visit?  no  Physicians involved in your care Any changes since last visit?  no   Family History  Problem Relation Age of Onset  . COPD Mother   . Diabetes Mother    History   Social History  . Marital Status: Unknown    Spouse Name: N/A    Number of Children: N/A  . Years of Education: N/A   Social History Main Topics  . Smoking status: Current Every Day Smoker -- 1.00 packs/day for 30 years    Types: Cigarettes  . Smokeless tobacco: Never Used  . Alcohol Use: None  . Drug Use: None  . Sexually Active: None   Other Topics Concern  . None   Social History Narrative  . None   Past Surgical History  Procedure Laterality Date  . Spine surgery  12/2010    micro diskectomy   Past Medical History  Diagnosis Date  . Asthma   . Factor 5 Leiden mutation, heterozygous   . Clotting disorder     "has 1 count of factor 5 "   BP 118/91  Pulse 96  Resp 14  Ht 5\' 8"   (1.727 m)  Wt 178 lb (80.74 kg)  BMI 27.07 kg/m2  SpO2 96%     Review of Systems  Neurological: Positive for weakness and numbness.  All other systems reviewed and are negative.       Objective:   Physical Exam  Nursing note and vitals reviewed.  Constitutional: He is oriented to person, place, and time. He appears well-developed and well-nourished.  HENT:  Head: Normocephalic and atraumatic.  Right Ear: External ear normal.  Left Ear: External ear normal.  Eyes: Conjunctivae normal and EOM are normal. Pupils are equal, round, and reactive to light.  Neck: Normal range of motion.  Musculoskeletal:  Lumbar back: He exhibits decreased range of motion, tenderness and pain. He exhibits no deformity and no spasm.  Neurological: He is alert and oriented to person, place, and time. He has normal strength and normal reflexes. Gait normal.  Psychiatric: He has a normal mood and affect.  L5 and S1 sensory deficit on left side only Unchanged    Assessment & Plan:  1. Lumbar post laminectomy syndrome Status post L3-L4 laminectomy and discectomy. Has some evidence of L5 radiculopathy as well. Left foraminal disc protrusion which is known. No surgical intervention was recommended by the surgeon that evaluated him in [place].  Recommend ambulation 30 minutes  everyday  Trial oxycodone 5 mg 3 times per day.He actually takes this medication sporadically, Some days 0 pills some days 4 pills. See the patient back in 4 weeks.  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  Left leg rash seen by dermatologist diagnosis of stasis dermatitis       PA visit in future can review exercise.

## 2012-07-15 NOTE — Patient Instructions (Signed)
Next appointment will will be with Clydie Braun PA who is also PT he may discuss exercises with her

## 2012-08-13 ENCOUNTER — Encounter: Payer: Self-pay | Admitting: Physical Medicine and Rehabilitation

## 2012-08-13 ENCOUNTER — Encounter
Payer: Worker's Compensation | Attending: Physical Medicine & Rehabilitation | Admitting: Physical Medicine and Rehabilitation

## 2012-08-13 VITALS — BP 116/83 | HR 82 | Resp 14 | Ht 68.0 in | Wt 180.0 lb

## 2012-08-13 DIAGNOSIS — M79609 Pain in unspecified limb: Secondary | ICD-10-CM | POA: Insufficient documentation

## 2012-08-13 DIAGNOSIS — M5126 Other intervertebral disc displacement, lumbar region: Secondary | ICD-10-CM | POA: Insufficient documentation

## 2012-08-13 DIAGNOSIS — M545 Low back pain, unspecified: Secondary | ICD-10-CM | POA: Diagnosis not present

## 2012-08-13 DIAGNOSIS — M961 Postlaminectomy syndrome, not elsewhere classified: Secondary | ICD-10-CM | POA: Insufficient documentation

## 2012-08-13 DIAGNOSIS — M79605 Pain in left leg: Secondary | ICD-10-CM

## 2012-08-13 MED ORDER — OXYCODONE HCL 5 MG PO TABS: 5.0000 mg | ORAL_TABLET | Freq: Three times a day (TID) | ORAL | Status: DC | PRN

## 2012-08-13 MED ORDER — DICLOFENAC EPOLAMINE 1.3 % TD PTCH: 1.0000 | MEDICATED_PATCH | Freq: Two times a day (BID) | TRANSDERMAL | Status: DC

## 2012-08-13 NOTE — Patient Instructions (Signed)
Continue with your massage and staying active.

## 2012-08-13 NOTE — Progress Notes (Signed)
Subjective:    Patient ID: Cody Grant, male    DOB: Nov 25, 1960, 52 y.o.   MRN: [id number]  HPI The patient is a 52 year old male, who presents with LBP, radiating into his left LE. Marland Kitchen The symptoms started after a work related injury in July  2011 ago. The patient complains about moderate pain . Dennie Bible He describes the pain as aching and sometimes sharp . Applying heat, taking medications , changing positions alleviate the symptoms. Prolonged sitting or standing aggrevates the symptoms. The patient grades his pain as a 7 /10. Hx of L3-L4 discectomy in September of 2012. Postoperatively his back pain got better, however his left leg pain became more severe and more constant.  Has tried Lyrica as well as amitriptyline, which he did not tolerate well. He takes 0-2 oxycodone per day.  Has had relief with physical therapy in the past, which only last for a short time.  Injection did not give him much relief.  Pain Inventory Average Pain 6 Pain Right Now 7 My pain is sharp, stabbing and aching  In the last 24 hours, has pain interfered with the following? General activity 6 Relation with others 4 Enjoyment of life 5 What TIME of day is your pain at its worst? evening Sleep (in general) Fair  Pain is worse with: walking, bending, sitting and standing Pain improves with: rest, heat/ice, pacing activities, TENS and injections Relief from Meds: 7  Mobility walk without assistance how many minutes can you walk? 15 ability to climb steps?  yes do you drive?  yes transfers alone  Function disabled: date disabled . I need assistance with the following:  dressing, meal prep, household duties and shopping  Neuro/Psych numbness tingling trouble walking  Prior Studies Any changes since last visit?  no  Physicians involved in your care Any changes since last visit?  no   Family History  Problem Relation Age of Onset  . COPD Mother   . Diabetes Mother    History   Social History   . Marital Status: Unknown    Spouse Name: N/A    Number of Children: N/A  . Years of Education: N/A   Social History Main Topics  . Smoking status: Current Every Day Smoker -- 1.00 packs/day for 30 years    Types: Cigarettes  . Smokeless tobacco: Never Used  . Alcohol Use: None  . Drug Use: None  . Sexually Active: None   Other Topics Concern  . None   Social History Narrative  . None   Past Surgical History  Procedure Laterality Date  . Spine surgery  12/2010    micro diskectomy   Past Medical History  Diagnosis Date  . Asthma   . Factor 5 Leiden mutation, heterozygous   . Clotting disorder     "has 1 count of factor 5 "   BP 116/83  Pulse 82  Resp 14  Ht 5\' 8"  (1.727 m)  Wt 180 lb (81.647 kg)  BMI 27.38 kg/m2  SpO2 94%     Review of Systems  Musculoskeletal: Positive for back pain and gait problem.  Neurological: Positive for numbness.  All other systems reviewed and are negative.       Objective:   Physical Exam  Constitutional: He is oriented to person, place, and time. He appears well-developed and well-nourished.  HENT:  Head: Normocephalic.  Neck: Neck supple.  Musculoskeletal: He exhibits tenderness.  Neurological: He is alert and oriented to person, place, and time.  Skin: Skin is warm and dry.  Psychiatric: He has a normal mood and affect.    Symmetric normal motor tone is noted throughout. Normal muscle bulk. Muscle testing reveals 5/5 muscle strength of the upper extremity, and 5/5 of the lower extremity, except gastrocnemius on the left 4/5, and give away weakness of tibialis anterior on the left. Full range of motion in upper and lower extremities. ROM of spine is restricted. Fine motor movements are normal in both hands. Sensory is intact and symmetric to light touch, pinprick and proprioception. DTR in the upper and lower extremity are present and symmetric 2+. No clonus is noted.  Patient arises from chair without difficulty. Narrow  based gait with normal arm swing bilateral , able to walk on heels and toes . Tandem walk is stable. No pronator drift. Rhomberg negative. Left foot everted when walking        Assessment & Plan:   1. Lumbar post laminectomy syndrome Status post L3-L4 laminectomy and discectomy, 12/2010. Has some evidence of L5 radiculopathy as well.  No surgical intervention was recommended by the surgeon that evaluated him in [place].  Recommend ambulation 30 minutes everyday  Refilled oxycodone 5 mg 3 times per day.He actually takes this medication sporadically, Some days 0 pills some days 4 pills. Prescribed Flector patches for exacerbation of his LBP after prolonged sitting or doing too much.  See the patient back in 4 weeks.   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

## 2012-08-21 ENCOUNTER — Telehealth: Payer: Self-pay

## 2012-08-21 NOTE — Telephone Encounter (Signed)
A women called to follow up on prior authorization.  Left message informing them this has been initiated and is still processing.

## 2012-09-10 ENCOUNTER — Encounter: Payer: Self-pay | Admitting: Physical Medicine and Rehabilitation

## 2012-09-10 ENCOUNTER — Encounter
Payer: Worker's Compensation | Attending: Physical Medicine and Rehabilitation | Admitting: Physical Medicine and Rehabilitation

## 2012-09-10 VITALS — BP 128/83 | HR 86 | Resp 14 | Ht 68.0 in | Wt 178.0 lb

## 2012-09-10 DIAGNOSIS — Z5181 Encounter for therapeutic drug level monitoring: Secondary | ICD-10-CM

## 2012-09-10 DIAGNOSIS — Z79899 Other long term (current) drug therapy: Secondary | ICD-10-CM

## 2012-09-10 DIAGNOSIS — M545 Low back pain, unspecified: Secondary | ICD-10-CM | POA: Insufficient documentation

## 2012-09-10 DIAGNOSIS — F17200 Nicotine dependence, unspecified, uncomplicated: Secondary | ICD-10-CM | POA: Insufficient documentation

## 2012-09-10 DIAGNOSIS — M961 Postlaminectomy syndrome, not elsewhere classified: Secondary | ICD-10-CM | POA: Insufficient documentation

## 2012-09-10 DIAGNOSIS — D6859 Other primary thrombophilia: Secondary | ICD-10-CM | POA: Insufficient documentation

## 2012-09-10 DIAGNOSIS — M79609 Pain in unspecified limb: Secondary | ICD-10-CM | POA: Insufficient documentation

## 2012-09-10 MED ORDER — OXYCODONE HCL 5 MG PO TABS: 5.0000 mg | ORAL_TABLET | Freq: Three times a day (TID) | ORAL | Status: DC | PRN

## 2012-09-10 NOTE — Progress Notes (Signed)
Subjective:    Patient ID: Cody Grant, male    DOB: 18-Jan-1961, 52 y.o.   MRN: [id number]  HPI The patient is a 52 year old male, who presents with LBP, radiating into his left LE. Marland Kitchen The symptoms started after a work related injury in July 2011 ago. The patient complains about moderate pain . Dennie Bible He describes the pain as aching and sometimes sharp . Applying heat, taking medications , changing positions alleviate the symptoms. Prolonged sitting or standing aggrevates the symptoms. The patient grades his pain as a 7 /10. The patient reports that the Flector patches have helped him, when he has an exacerbation of his symptoms. Hx of L3-L4 discectomy in September of 2012. Postoperatively his back pain got better, however his left leg pain became more severe and more constant.  Has tried Lyrica as well as amitriptyline, which he did not tolerate well. He takes 0-2 oxycodone per day.  Has had relief with physical therapy in the past, which only last for a short time.  Injection did not give him much relief.  Pain Inventory Average Pain 6 Pain Right Now 7 My pain is sharp, stabbing and tingling  In the last 24 hours, has pain interfered with the following? General activity 7 Relation with others 5 Enjoyment of life 6 What TIME of day is your pain at its worst? evening Sleep (in general) Fair  Pain is worse with: bending, sitting and standing Pain improves with: heat/ice, pacing activities, TENS and injections Relief from Meds: 8  Mobility walk without assistance how many minutes can you walk? 20 ability to climb steps?  yes do you drive?  yes transfers alone  Function disabled: date disabled 2012 I need assistance with the following:  meal prep, household duties and shopping  Neuro/Psych numbness tingling  Prior Studies Any changes since last visit?  no  Physicians involved in your care Any changes since last visit?  no   Family History  Problem Relation Age of Onset   . COPD Mother   . Diabetes Mother    History   Social History  . Marital Status: Unknown    Spouse Name: N/A    Number of Children: N/A  . Years of Education: N/A   Social History Main Topics  . Smoking status: Current Every Day Smoker -- 1.00 packs/day for 30 years    Types: Cigarettes  . Smokeless tobacco: Never Used  . Alcohol Use: None  . Drug Use: None  . Sexually Active: None   Other Topics Concern  . None   Social History Narrative  . None   Past Surgical History  Procedure Laterality Date  . Spine surgery  12/2010    micro diskectomy   Past Medical History  Diagnosis Date  . Asthma   . Factor 5 Leiden mutation, heterozygous   . Clotting disorder     "has 1 count of factor 5 "   BP 128/83  Pulse 86  Resp 14  Ht 5\' 8"  (1.727 m)  Wt 178 lb (80.74 kg)  BMI 27.07 kg/m2  SpO2 96%     Review of Systems  Musculoskeletal: Positive for back pain.  Neurological: Positive for numbness.  All other systems reviewed and are negative.       Objective:   Physical Exam Constitutional: He is oriented to person, place, and time. He appears well-developed and well-nourished.  HENT:  Head: Normocephalic.  Neck: Neck supple.  Musculoskeletal: He exhibits tenderness.  Neurological: He is alert  and oriented to person, place, and time.  Skin: Skin is warm and dry.  Psychiatric: He has a normal mood and affect.  Symmetric normal motor tone is noted throughout. Normal muscle bulk. Muscle testing reveals 5/5 muscle strength of the upper extremity, and 5/5 of the lower extremity, except gastrocnemius on the left 4/5, and give away weakness of tibialis anterior on the left. Full range of motion in upper and lower extremities. ROM of spine is restricted. Fine motor movements are normal in both hands.  Sensory is intact and symmetric to light touch, pinprick and proprioception.  DTR in the upper and lower extremity are present and symmetric 2+. No clonus is noted.   Patient arises from chair without difficulty. Narrow based gait with normal arm swing bilateral , able to walk on heels and toes . Tandem walk is stable. No pronator drift. Rhomberg negative.  Left foot everted when walking         Assessment & Plan:  1. Lumbar post laminectomy syndrome Status post L3-L4 laminectomy and discectomy, 12/2010. Has some evidence of L5 radiculopathy as well. No surgical intervention was recommended by the surgeon that evaluated him in [place].  Recommend ambulation 30 minutes everyday  Refilled oxycodone 5 mg 3 times per day.He actually takes this medication sporadically, Some days 0 pills some days 4 pills.   Flector patches for exacerbation of his LBP after prolonged sitting or doing too much, have helped.  See the patient back in 4 weeks.  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

## 2012-09-10 NOTE — Patient Instructions (Signed)
Continue with your walking and exercise program. Continue with your massage therapy.

## 2012-09-10 NOTE — Addendum Note (Signed)
Addended by: Doreene Eland on: 09/10/2012 02:33 PM   Modules accepted: Orders

## 2012-10-11 ENCOUNTER — Encounter: Payer: Self-pay | Admitting: Physical Medicine and Rehabilitation

## 2012-10-11 ENCOUNTER — Encounter
Payer: Worker's Compensation | Attending: Physical Medicine and Rehabilitation | Admitting: Physical Medicine and Rehabilitation

## 2012-10-11 VITALS — BP 126/78 | HR 95 | Resp 16 | Ht 68.0 in | Wt 177.0 lb

## 2012-10-11 DIAGNOSIS — F17200 Nicotine dependence, unspecified, uncomplicated: Secondary | ICD-10-CM | POA: Insufficient documentation

## 2012-10-11 DIAGNOSIS — D6859 Other primary thrombophilia: Secondary | ICD-10-CM | POA: Insufficient documentation

## 2012-10-11 DIAGNOSIS — M961 Postlaminectomy syndrome, not elsewhere classified: Secondary | ICD-10-CM | POA: Insufficient documentation

## 2012-10-11 DIAGNOSIS — M545 Low back pain, unspecified: Secondary | ICD-10-CM | POA: Insufficient documentation

## 2012-10-11 DIAGNOSIS — M79609 Pain in unspecified limb: Secondary | ICD-10-CM | POA: Insufficient documentation

## 2012-10-11 MED ORDER — OXYCODONE HCL 5 MG PO TABS: 5.0000 mg | ORAL_TABLET | Freq: Three times a day (TID) | ORAL | Status: DC | PRN

## 2012-10-11 NOTE — Patient Instructions (Addendum)
Stay as active as pain permits, continue with your walking program, and with your massage.

## 2012-10-11 NOTE — Progress Notes (Signed)
Subjective:    Patient ID: Cody Grant, male    DOB: 12/18/1960, 52 y.o.   MRN: [id number]  HPI The patient is a 52 year old male, who presents with LBP, radiating into his left LE. Marland Kitchen The symptoms started after a work related injury in July 2011 ago. The patient complains about moderate pain . Dennie Bible He describes the pain as aching and sometimes sharp . Applying heat, taking medications , changing positions alleviate the symptoms. Prolonged sitting or standing aggrevates the symptoms. The patient grades his pain as a 7 /10. The patient reports that the Flector patches have helped him, when he has an exacerbation of his symptoms.  Hx of L3-L4 discectomy in September of 2012. Postoperatively his back pain got better, however his left leg pain became more severe and more constant.  Has tried Lyrica as well as amitriptyline, which he did not tolerate well. He takes 0-2 oxycodone per day.  Has had relief with physical therapy in the past, which only last for a short time.  Injection did not give him much relief.  Pain Inventory Average Pain 6 Pain Right Now 7 My pain is sharp, stabbing and tingling  In the last 24 hours, has pain interfered with the following? General activity 6 Relation with others 4 Enjoyment of life 6 What TIME of day is your pain at its worst? evening,night Sleep (in general) Fair  Pain is worse with: walking, bending, sitting and standing Pain improves with: heat/ice, pacing activities, medication and TENS Relief from Meds: 7  Mobility walk without assistance how many minutes can you walk? 15 ability to climb steps?  yes do you drive?  yes transfers alone  Function disabled: date disabled 05-01-10 I need assistance with the following:  dressing, meal prep, household duties and shopping  Neuro/Psych numbness tingling  Prior Studies Any changes since last visit?  no  Physicians involved in your care Any changes since last visit?  no   Family History   Problem Relation Age of Onset  . COPD Mother   . Diabetes Mother    History   Social History  . Marital Status: Unknown    Spouse Name: N/A    Number of Children: N/A  . Years of Education: N/A   Social History Main Topics  . Smoking status: Current Every Day Smoker -- 1.00 packs/day for 30 years    Types: Cigarettes  . Smokeless tobacco: Never Used  . Alcohol Use: None  . Drug Use: None  . Sexually Active: None   Other Topics Concern  . None   Social History Narrative  . None   Past Surgical History  Procedure Laterality Date  . Spine surgery  12/2010    micro diskectomy   Past Medical History  Diagnosis Date  . Asthma   . Factor 5 Leiden mutation, heterozygous   . Clotting disorder     "has 1 count of factor 5 "   BP 126/78  Pulse 95  Resp 16  Ht 5\' 8"  (1.727 m)  Wt 177 lb (80.287 kg)  BMI 26.92 kg/m2  SpO2 98%     Review of Systems  Neurological: Positive for numbness.       Tingling  All other systems reviewed and are negative.       Objective:   Physical Exam  Constitutional: He is oriented to person, place, and time. He appears well-developed and well-nourished.  HENT:  Head: Normocephalic.  Neck: Neck supple.  Musculoskeletal: He exhibits tenderness.  Neurological: He is alert and oriented to person, place, and time.  Skin: Skin is warm and dry.  Psychiatric: He has a normal mood and affect.  Symmetric normal motor tone is noted throughout. Normal muscle bulk. Muscle testing reveals 5/5 muscle strength of the upper extremity, and 5/5 of the lower extremity, except gastrocnemius on the left 4/5, and give away weakness of tibialis anterior on the left. Full range of motion in upper and lower extremities. ROM of spine is restricted. Fine motor movements are normal in both hands.  Sensory is intact and symmetric to light touch, pinprick and proprioception.  DTR in the upper and lower extremity are present and symmetric 2+. No clonus is noted.   Patient arises from chair without difficulty. Narrow based gait with normal arm swing bilateral , able to walk on heels and toes . Tandem walk is stable. No pronator drift. Rhomberg negative.  Left foot everted when walking        Assessment & Plan:  1. Lumbar post laminectomy syndrome Status post L3-L4 laminectomy and discectomy, 12/2010. Has some evidence of L5 radiculopathy as well. No surgical intervention was recommended by the surgeon that evaluated him in [place].  Recommend ambulation 30 minutes everyday  Refilled oxycodone 5 mg 3 times per day.He actually takes this medication sporadically, Some days 0 pills some days 4 pills.  Flector patches for exacerbation of his LBP after prolonged sitting or doing too much, have helped.  See the patient back in 4 weeks.  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

## 2012-10-14 DIAGNOSIS — I872 Venous insufficiency (chronic) (peripheral): Secondary | ICD-10-CM | POA: Diagnosis not present

## 2012-10-14 DIAGNOSIS — L821 Other seborrheic keratosis: Secondary | ICD-10-CM | POA: Diagnosis not present

## 2012-10-14 DIAGNOSIS — D2370 Other benign neoplasm of skin of unspecified lower limb, including hip: Secondary | ICD-10-CM | POA: Diagnosis not present

## 2012-10-14 DIAGNOSIS — I781 Nevus, non-neoplastic: Secondary | ICD-10-CM | POA: Diagnosis not present

## 2012-10-14 DIAGNOSIS — D239 Other benign neoplasm of skin, unspecified: Secondary | ICD-10-CM | POA: Diagnosis not present

## 2012-10-14 DIAGNOSIS — L570 Actinic keratosis: Secondary | ICD-10-CM | POA: Diagnosis not present

## 2012-11-11 ENCOUNTER — Encounter: Payer: Worker's Compensation | Admitting: Physical Medicine and Rehabilitation

## 2012-11-26 ENCOUNTER — Encounter
Payer: Worker's Compensation | Attending: Physical Medicine and Rehabilitation | Admitting: Physical Medicine and Rehabilitation

## 2012-11-26 ENCOUNTER — Encounter: Payer: Self-pay | Admitting: Physical Medicine and Rehabilitation

## 2012-11-26 VITALS — BP 128/79 | HR 87 | Resp 16 | Ht 68.0 in | Wt 179.0 lb

## 2012-11-26 DIAGNOSIS — F17200 Nicotine dependence, unspecified, uncomplicated: Secondary | ICD-10-CM | POA: Insufficient documentation

## 2012-11-26 DIAGNOSIS — D6859 Other primary thrombophilia: Secondary | ICD-10-CM | POA: Insufficient documentation

## 2012-11-26 DIAGNOSIS — M961 Postlaminectomy syndrome, not elsewhere classified: Secondary | ICD-10-CM | POA: Insufficient documentation

## 2012-11-26 MED ORDER — METHOCARBAMOL 500 MG PO TABS: 500.0000 mg | ORAL_TABLET | Freq: Three times a day (TID) | ORAL | Status: DC

## 2012-11-26 MED ORDER — METHYLPREDNISOLONE (PAK) 4 MG PO TABS: ORAL_TABLET | ORAL | Status: DC

## 2012-11-26 MED ORDER — OXYCODONE HCL 5 MG PO TABS: 5.0000 mg | ORAL_TABLET | Freq: Three times a day (TID) | ORAL | Status: DC | PRN

## 2012-11-26 MED ORDER — DICLOFENAC EPOLAMINE 1.3 % TD PTCH: 1.0000 | MEDICATED_PATCH | Freq: Two times a day (BID) | TRANSDERMAL | Status: DC

## 2012-11-26 NOTE — Patient Instructions (Signed)
Stay as active as tolerated, continue with your walking program

## 2012-11-26 NOTE — Progress Notes (Signed)
Subjective:    Patient ID: Cody Grant, male    DOB: 1960-09-03, 52 y.o.   MRN: [id number]  HPI The patient is a 52 year old male, who presents with LBP, radiating into his left LE. Marland Kitchen The symptoms started after a work related injury in July 2011 ago. The patient complains about moderate pain . Dennie Bible He describes the pain as aching and sometimes sharp . Applying heat, taking medications , changing positions alleviate the symptoms. Prolonged sitting or standing aggrevates the symptoms. The patient grades his pain as a 7 /10. The patient reports that the Flector patches have helped him, when he has an exacerbation of his symptoms.  He reports that he has been moving into a new appartment the last 2 weeks, he also stumbled on the stairs once, since about 1 1/2 week ago he has an exacerbation of his LBP, radiating into his right posterior hip. Hx of L3-L4 discectomy in September of 2012. Postoperatively his back pain got better, however his left leg pain became more severe and more constant.  Has tried Lyrica as well as amitriptyline, which he did not tolerate well. He takes 0-2 oxycodone per day.  Has had relief with physical therapy in the past, which only last for a short time.  Injection did not give him much relief.  Pain Inventory Average Pain 6 Pain Right Now 8 My pain is sharp, stabbing, tingling and aching  In the last 24 hours, has pain interfered with the following? General activity 7 Relation with others 5 Enjoyment of life 7 What TIME of day is your pain at its worst? evening, night Sleep (in general) Poor  Pain is worse with: walking, bending, sitting and standing Pain improves with: heat/ice, pacing activities, medication and TENS Relief from Meds: 8  Mobility walk without assistance how many minutes can you walk? 10 ability to climb steps?  yes do you drive?  yes  Function disabled: date disabled 05-20-11 I need assistance with the following:  dressing, meal prep,  household duties and shopping  Neuro/Psych numbness tingling trouble walking  Prior Studies Any changes since last visit?  no  Physicians involved in your care Any changes since last visit?  no   Family History  Problem Relation Age of Onset  . COPD Mother   . Diabetes Mother    History   Social History  . Marital Status: Unknown    Spouse Name: N/A    Number of Children: N/A  . Years of Education: N/A   Social History Main Topics  . Smoking status: Current Every Day Smoker -- 1.00 packs/day for 30 years    Types: Cigarettes  . Smokeless tobacco: Never Used  . Alcohol Use: None  . Drug Use: None  . Sexually Active: None   Other Topics Concern  . None   Social History Narrative  . None   Past Surgical History  Procedure Laterality Date  . Spine surgery  12/2010    micro diskectomy   Past Medical History  Diagnosis Date  . Asthma   . Factor 5 Leiden mutation, heterozygous   . Clotting disorder     "has 1 count of factor 5 "   BP 128/79  Pulse 87  Resp 16  Ht 5\' 8"  (1.727 m)  Wt 179 lb (81.194 kg)  BMI 27.22 kg/m2  SpO2 94%     Review of Systems  Respiratory: Positive for cough.   Musculoskeletal: Positive for gait problem.  Neurological: Positive for numbness.  Tingling   All other systems reviewed and are negative.       Objective:   Physical Exam Constitutional: He is oriented to person, place, and time. He appears well-developed and well-nourished.  HENT:  Head: Normocephalic.  Neck: Neck supple.  Musculoskeletal: He exhibits tenderness. Paraspinal on the left, and posterior hip, gluts and piriformis on the left. Neurological: He is alert and oriented to person, place, and time.  Skin: Skin is warm and dry.  Psychiatric: He has a normal mood and affect.  Symmetric normal motor tone is noted throughout. Normal muscle bulk. Muscle testing reveals 5/5 muscle strength of the upper extremity, and 5/5 of the lower extremity, except  gastrocnemius on the left 4/5, and give away weakness of tibialis anterior on the left. Full range of motion in upper and lower extremities. ROM of spine is restricted. Fine motor movements are normal in both hands.  Sensory is intact and symmetric to light touch, pinprick and proprioception.  DTR in the upper and lower extremity are present and symmetric 2+. No clonus is noted.  Patient arises from chair without difficulty. Narrow based gait with normal arm swing bilateral , able to walk on heels and toes . Tandem walk is stable. No pronator drift. Rhomberg negative.  Left foot everted when walking         Assessment & Plan:  1. Lumbar post laminectomy syndrome Status post L3-L4 laminectomy and discectomy, 12/2010. Has some evidence of L5 radiculopathy as well. No surgical intervention was recommended by the surgeon that evaluated him in [place].  Recommend ambulation 30 minutes everyday  Refilled oxycodone 5 mg, 3 times per day.He actually takes this medication sporadically, Some days 0 pills some days 4 pills.  Exacerbation of his LBP after his move, prescribed a medrol dose pack, which he has tolerated in the past, also prescribed Robaxin for his muscle spasms/pain in his lower back.   See the patient back in 1 month.  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

## 2012-12-25 ENCOUNTER — Encounter
Payer: Worker's Compensation | Attending: Physical Medicine and Rehabilitation | Admitting: Physical Medicine and Rehabilitation

## 2012-12-25 ENCOUNTER — Encounter: Payer: Self-pay | Admitting: Physical Medicine and Rehabilitation

## 2012-12-25 VITALS — BP 133/88 | HR 85 | Resp 14 | Ht 68.0 in | Wt 178.0 lb

## 2012-12-25 DIAGNOSIS — M5126 Other intervertebral disc displacement, lumbar region: Secondary | ICD-10-CM

## 2012-12-25 DIAGNOSIS — M961 Postlaminectomy syndrome, not elsewhere classified: Secondary | ICD-10-CM | POA: Insufficient documentation

## 2012-12-25 DIAGNOSIS — M545 Low back pain, unspecified: Secondary | ICD-10-CM | POA: Insufficient documentation

## 2012-12-25 DIAGNOSIS — F17200 Nicotine dependence, unspecified, uncomplicated: Secondary | ICD-10-CM | POA: Insufficient documentation

## 2012-12-25 MED ORDER — OXYCODONE HCL 5 MG PO TABS: 5.0000 mg | ORAL_TABLET | Freq: Three times a day (TID) | ORAL | Status: DC | PRN

## 2012-12-25 NOTE — Progress Notes (Signed)
Subjective:    Patient ID: Cody Grant, male    DOB: 04/29/1961, 52 y.o.   MRN: [id number]  HPI The patient is a 52 year old male, who presents with LBP, radiating into his left LE. Marland Kitchen The symptoms started after a work related injury in July 2011 ago. The patient complains about moderate pain . Dennie Bible He describes the pain as aching and sometimes sharp . Applying heat, taking medications , changing positions alleviate the symptoms. Prolonged sitting or standing aggrevates the symptoms. The patient grades his pain as a 7 /10. The patient reports that the Flector patches have helped him, when he has an exacerbation of his symptoms.  He reports that the Robaxin also has helped to relief his Sx. Hx of L3-L4 discectomy in September of 2012. Postoperatively his back pain got better, however his left leg pain became more severe and more constant.  Has tried Lyrica as well as amitriptyline, which he did not tolerate well. He takes 1-3 oxycodone per day.  Has had relief with physical therapy in the past, which only last for a short time.  Injection did not give him much relief.  Pain Inventory Average Pain 6 Pain Right Now 5 My pain is sharp, stabbing and tingling  In the last 24 hours, has pain interfered with the following? General activity 5 Relation with others 4 Enjoyment of life 5 What TIME of day is your pain at its worst? evening, night Sleep (in general) Fair  Pain is worse with: walking, bending, sitting, standing and some activites Pain improves with: rest, heat/ice, pacing activities, medication and TENS Relief from Meds: 8  Mobility walk without assistance how many minutes can you walk? 20 ability to climb steps?  yes do you drive?  yes transfers alone  Function disabled: date disabled 2012 I need assistance with the following:  dressing, meal prep, household duties and shopping  Neuro/Psych numbness tingling  Prior Studies Any changes since last visit?   no  Physicians involved in your care Any changes since last visit?  no   Family History  Problem Relation Age of Onset  . COPD Mother   . Diabetes Mother    History   Social History  . Marital Status: Unknown    Spouse Name: N/A    Number of Children: N/A  . Years of Education: N/A   Social History Main Topics  . Smoking status: Current Every Day Smoker -- 1.00 packs/day for 30 years    Types: Cigarettes  . Smokeless tobacco: Never Used  . Alcohol Use: None  . Drug Use: None  . Sexual Activity: None   Other Topics Concern  . None   Social History Narrative  . None   Past Surgical History  Procedure Laterality Date  . Spine surgery  12/2010    micro diskectomy   Past Medical History  Diagnosis Date  . Asthma   . Factor 5 Leiden mutation, heterozygous   . Clotting disorder     "has 1 count of factor 5 "   BP 133/88  Pulse 85  Resp 14  Ht 5\' 8"  (1.727 m)  Wt 178 lb (80.74 kg)  BMI 27.07 kg/m2  SpO2 95%     Review of Systems  Neurological: Positive for numbness.       Tingling  All other systems reviewed and are negative.       Objective:   Physical Exam Constitutional: He is oriented to person, place, and time. He appears well-developed and  well-nourished.  HENT:  Head: Normocephalic.  Neck: Neck supple.  Musculoskeletal: He exhibits tenderness. Paraspinal on the left, and posterior hip, gluts and piriformis on the left. Neurological: He is alert and oriented to person, place, and time.  Skin: Skin is warm and dry.  Psychiatric: He has a normal mood and affect.  Symmetric normal motor tone is noted throughout. Normal muscle bulk. Muscle testing reveals 5/5 muscle strength of the upper extremity, and 5/5 of the lower extremity, except gastrocnemius on the left 4/5, and give away weakness of tibialis anterior on the left. Full range of motion in upper and lower extremities. ROM of spine is restricted. Fine motor movements are normal in both hands.   Sensory is intact and symmetric to light touch, pinprick and proprioception.  DTR in the upper and lower extremity are present and symmetric 2+. No clonus is noted.  Patient arises from chair without difficulty. Narrow based gait with normal arm swing bilateral , able to walk on heels and toes . Tandem walk is stable. No pronator drift. Rhomberg negative.  Left foot everted when walking         Assessment & Plan:  1. Lumbar post laminectomy syndrome Status post L3-L4 laminectomy and discectomy, 12/2010. Has some evidence of L5 radiculopathy as well. No surgical intervention was recommended by the surgeon that evaluated him in [place].  Recommend ambulation 30 minutes everyday  Refilled oxycodone 5 mg, 3 times per day.He actually takes this medication sporadically, Some days 0 pills some days 4 pills.  Prescribed Robaxin for his muscle spasms/pain in his lower back at his last visit, which gives him good relief.  Filled out forms for his Pension disability, spent 25 min with patient face to face. See the patient back in 1 month.  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

## 2012-12-25 NOTE — Patient Instructions (Signed)
Continue with your walking and stretching program. 

## 2013-01-22 ENCOUNTER — Encounter
Payer: Worker's Compensation | Attending: Physical Medicine and Rehabilitation | Admitting: Physical Medicine and Rehabilitation

## 2013-01-22 ENCOUNTER — Encounter: Payer: Self-pay | Admitting: Physical Medicine and Rehabilitation

## 2013-01-22 VITALS — BP 124/72 | HR 95 | Resp 14 | Ht 68.0 in | Wt 179.8 lb

## 2013-01-22 DIAGNOSIS — M961 Postlaminectomy syndrome, not elsewhere classified: Secondary | ICD-10-CM | POA: Insufficient documentation

## 2013-01-22 MED ORDER — OXYCODONE HCL 5 MG PO TABS: 5.0000 mg | ORAL_TABLET | Freq: Three times a day (TID) | ORAL | Status: DC | PRN

## 2013-01-22 NOTE — Patient Instructions (Signed)
Continue with your walking program. 

## 2013-01-22 NOTE — Progress Notes (Signed)
Subjective:    Patient ID: Cody Grant, male    DOB: [birth year], 52 y.o.   MRN: [id number]  HPI The patient is a 52 year old male, who presents with LBP, radiating into his left LE. Marland Kitchen The symptoms started after a work related injury in July 2011 ago. The patient complains about moderate pain . He describes the pain as aching and sometimes sharp . Applying heat, taking medications , changing positions alleviate the symptoms. Prolonged sitting or standing aggrevates the symptoms. The patient grades his pain as a 5 /10. The patient reports that the Flector patches have helped him, when he has an exacerbation of his symptoms.  He reports that the Robaxin also has helped to relief his Sx.  Hx of L3-L4 discectomy in September of 2012. Postoperatively his back pain got better, however his left leg pain became more severe and more constant.  Has tried Lyrica as well as amitriptyline, which he did not tolerate well. He takes 1-3 oxycodone per day.  Has had relief with physical therapy in the past, which only last for a short time.  Injection did not give him much relief.  Pain Inventory Average Pain 6 Pain Right Now 5 My pain is sharp, stabbing and tingling  In the last 24 hours, has pain interfered with the following? General activity 5 Relation with others 4 Enjoyment of life 5 What TIME of day is your pain at its worst? morning, night Sleep (in general) Fair  Pain is worse with: walking, bending, sitting and standing Pain improves with: heat/ice, pacing activities, medication and TENS Relief from Meds: 8  Mobility walk without assistance how many minutes can you walk? 15 ability to climb steps?  yes do you drive?  yes transfers alone  Function disabled: date disabled 2012 I need assistance with the following:  dressing, meal prep, household duties and shopping  Neuro/Psych numbness tingling  Prior Studies Any changes since last visit?  no  Physicians involved in your  care Any changes since last visit?  no   Family History  Problem Relation Age of Onset  . COPD Mother   . Diabetes Mother    History   Social History  . Marital Status: Unknown    Spouse Name: N/A    Number of Children: N/A  . Years of Education: N/A   Social History Main Topics  . Smoking status: Current Every Day Smoker -- 1.00 packs/day for 30 years    Types: Cigarettes  . Smokeless tobacco: Never Used  . Alcohol Use: None  . Drug Use: None  . Sexual Activity: None   Other Topics Concern  . None   Social History Narrative  . None   Past Surgical History  Procedure Laterality Date  . Spine surgery  12/2010    micro diskectomy   Past Medical History  Diagnosis Date  . Asthma   . Factor 5 Leiden mutation, heterozygous   . Clotting disorder     "has 1 count of factor 5 "   BP 124/72  Pulse 95  Resp 14  Ht 5\' 8"  (1.727 m)  Wt 179 lb 12.8 oz (81.557 kg)  BMI 27.34 kg/m2  SpO2 94%    Review of Systems  Neurological: Positive for numbness.       Tingling  All other systems reviewed and are negative.       Objective:   Physical Exam Constitutional: He is oriented to person, place, and time. He appears well-developed and well-nourished.  HENT:  Head: Normocephalic.  Neck: Neck supple.  Musculoskeletal: He exhibits tenderness. Paraspinal on the left, and posterior hip, gluts and piriformis on the left. Neurological: He is alert and oriented to person, place, and time.  Skin: Skin is warm and dry.  Psychiatric: He has a normal mood and affect.  Symmetric normal motor tone is noted throughout. Normal muscle bulk. Muscle testing reveals 5/5 muscle strength of the upper extremity, and 5/5 of the lower extremity, except gastrocnemius on the left 4/5, and give away weakness of tibialis anterior on the left. Full range of motion in upper and lower extremities. ROM of spine is restricted. Fine motor movements are normal in both hands.  Sensory is intact and  symmetric to light touch, pinprick and proprioception.  DTR in the upper and lower extremity are present and symmetric 2+. No clonus is noted.  Patient arises from chair without difficulty. Narrow based gait with normal arm swing bilateral , able to walk on heels and toes . Tandem walk is stable. No pronator drift. Rhomberg negative.  Left foot everted when walking         Assessment & Plan:  1. Lumbar post laminectomy syndrome Status post L3-L4 laminectomy and discectomy, 12/2010. Has some evidence of L5 radiculopathy as well. No surgical intervention was recommended by the surgeon that evaluated him in [place].  Recommend ambulation 30 minutes everyday  Refilled oxycodone 5 mg, 3 times per day.He actually takes this medication sporadically, Some days 0 pills some days 4 pills.  Prescribed Robaxin for his muscle spasms/pain in his lower back at his last visit, which gives him good relief.    See the patient back in 6 weeks.  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

## 2013-03-03 ENCOUNTER — Encounter
Payer: Worker's Compensation | Attending: Physical Medicine and Rehabilitation | Admitting: Physical Medicine and Rehabilitation

## 2013-03-03 ENCOUNTER — Encounter: Payer: Self-pay | Admitting: Physical Medicine and Rehabilitation

## 2013-03-03 VITALS — BP 136/82 | HR 95 | Resp 14 | Ht 68.0 in | Wt 180.0 lb

## 2013-03-03 DIAGNOSIS — IMO0002 Reserved for concepts with insufficient information to code with codable children: Secondary | ICD-10-CM | POA: Diagnosis not present

## 2013-03-03 DIAGNOSIS — M545 Low back pain: Secondary | ICD-10-CM | POA: Diagnosis present

## 2013-03-03 DIAGNOSIS — M79609 Pain in unspecified limb: Secondary | ICD-10-CM | POA: Diagnosis not present

## 2013-03-03 DIAGNOSIS — Z5181 Encounter for therapeutic drug level monitoring: Secondary | ICD-10-CM

## 2013-03-03 DIAGNOSIS — M961 Postlaminectomy syndrome, not elsewhere classified: Secondary | ICD-10-CM | POA: Diagnosis not present

## 2013-03-03 DIAGNOSIS — Z79899 Other long term (current) drug therapy: Secondary | ICD-10-CM

## 2013-03-03 MED ORDER — OXYCODONE HCL 5 MG PO TABS: 5.0000 mg | ORAL_TABLET | Freq: Three times a day (TID) | ORAL | Status: DC | PRN

## 2013-03-03 NOTE — Patient Instructions (Signed)
Continue with staying as active as tolerated, continue with your walking program

## 2013-03-03 NOTE — Progress Notes (Signed)
Subjective:    Patient ID: Cody Grant, male    DOB: 08-Apr-1961, 52 y.o.   MRN: [id number]  HPI The patient is a 52 year old male, who presents with LBP, radiating into his left LE. Marland Kitchen The symptoms started after a work related injury in July 2011 ago. The patient complains about moderate pain . He describes the pain as aching and sometimes sharp . Applying heat, taking medications , changing positions alleviate the symptoms. Prolonged sitting or standing aggrevates the symptoms. The patient grades his pain as a 5 /10. The patient reports that the Flector patches have helped him, when he has an exacerbation of his symptoms.  He reports that the Robaxin also has helped to relief his Sx. The problem has been stable  Hx of L3-L4 discectomy in September of 2012. Postoperatively his back pain got better, however his left leg pain became more severe and more constant.  Has tried Lyrica as well as amitriptyline, which he did not tolerate well. He takes 1-3 oxycodone per day.  Has had relief with physical therapy in the past, which only last for a short time.  Injection did not give him much relief.  Pain Inventory Average Pain 6 Pain Right Now 8 My pain is sharp, stabbing and tingling  In the last 24 hours, has pain interfered with the following? General activity 7 Relation with others 6 Enjoyment of life 7 What TIME of day is your pain at its worst? morning and night Sleep (in general) Fair  Pain is worse with: walking, bending, sitting and standing Pain improves with: rest, heat/ice, pacing activities, medication and TENS Relief from Meds: 7  Mobility walk without assistance how many minutes can you walk? 15 ability to climb steps?  yes do you drive?  yes  Function disabled: date disabled . I need assistance with the following:  meal prep, household duties and shopping  Neuro/Psych numbness tingling  Prior Studies Any changes since last visit?  no  Physicians involved in  your care Any changes since last visit?  no   Family History  Problem Relation Age of Onset  . COPD Mother   . Diabetes Mother    History   Social History  . Marital Status: Unknown    Spouse Name: N/A    Number of Children: N/A  . Years of Education: N/A   Social History Main Topics  . Smoking status: Current Every Day Smoker -- 1.00 packs/day for 30 years    Types: Cigarettes  . Smokeless tobacco: Never Used  . Alcohol Use: None  . Drug Use: None  . Sexual Activity: None   Other Topics Concern  . None   Social History Narrative  . None   Past Surgical History  Procedure Laterality Date  . Spine surgery  12/2010    micro diskectomy   Past Medical History  Diagnosis Date  . Asthma   . Factor 5 Leiden mutation, heterozygous   . Clotting disorder     "has 1 count of factor 5 "   BP 136/82  Pulse 95  Resp 14  Ht 5\' 8"  (1.727 m)  Wt 180 lb (81.647 kg)  BMI 27.38 kg/m2  SpO2 92%    Review of Systems  Respiratory: Positive for cough and wheezing.   Neurological: Positive for numbness.       Tingling  All other systems reviewed and are negative.       Objective:   Physical Exam Constitutional: He is oriented to  person, place, and time. He appears well-developed and well-nourished.  HENT:  Head: Normocephalic.  Neck: Neck supple.  Musculoskeletal: He exhibits tenderness. Paraspinal on the left, and posterior hip, gluts and piriformis on the left. Neurological: He is alert and oriented to person, place, and time.  Skin: Skin is warm and dry.  Psychiatric: He has a normal mood and affect.  Symmetric normal motor tone is noted throughout. Normal muscle bulk. Muscle testing reveals 5/5 muscle strength of the upper extremity, and 5/5 of the lower extremity, except gastrocnemius on the left 4/5, and give away weakness of tibialis anterior on the left. Full range of motion in upper and lower extremities. ROM of spine is restricted. Fine motor movements are  normal in both hands.  Sensory is intact and symmetric to light touch, pinprick and proprioception.  DTR in the upper and lower extremity are present and symmetric 2+. No clonus is noted.  Patient arises from chair without difficulty. Narrow based gait with normal arm swing bilateral , able to walk on heels and toes . Tandem walk is stable. No pronator drift. Rhomberg negative.  Left foot everted when walking         Assessment & Plan:  1. Lumbar post laminectomy syndrome Status post L3-L4 laminectomy and discectomy, 12/2010. Has some evidence of L5 radiculopathy as well. No surgical intervention was recommended by the surgeon that evaluated him in [place].  Recommend ambulation 30 minutes everyday  Refilled oxycodone 5 mg, 3 times per day.He actually takes this medication sporadically, Some days 0 pills some days 4 pills.  Prescribed Robaxin for his muscle spasms/pain in his lower back at his last visit, which gives him good relief.  See the patient back in 1 month.  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

## 2013-03-06 ENCOUNTER — Other Ambulatory Visit: Payer: Self-pay

## 2013-03-06 DIAGNOSIS — J209 Acute bronchitis, unspecified: Secondary | ICD-10-CM | POA: Diagnosis not present

## 2013-04-01 ENCOUNTER — Encounter: Payer: Self-pay | Admitting: Physical Medicine and Rehabilitation

## 2013-04-01 ENCOUNTER — Encounter
Payer: Worker's Compensation | Attending: Physical Medicine and Rehabilitation | Admitting: Physical Medicine and Rehabilitation

## 2013-04-01 VITALS — BP 123/77 | HR 83 | Resp 14 | Ht 68.0 in | Wt 182.4 lb

## 2013-04-01 DIAGNOSIS — M545 Low back pain, unspecified: Secondary | ICD-10-CM | POA: Insufficient documentation

## 2013-04-01 DIAGNOSIS — F17200 Nicotine dependence, unspecified, uncomplicated: Secondary | ICD-10-CM | POA: Insufficient documentation

## 2013-04-01 DIAGNOSIS — M961 Postlaminectomy syndrome, not elsewhere classified: Secondary | ICD-10-CM

## 2013-04-01 DIAGNOSIS — D6859 Other primary thrombophilia: Secondary | ICD-10-CM | POA: Insufficient documentation

## 2013-04-01 MED ORDER — METHOCARBAMOL 500 MG PO TABS: 500.0000 mg | ORAL_TABLET | Freq: Three times a day (TID) | ORAL | Status: DC

## 2013-04-01 MED ORDER — OXYCODONE HCL 5 MG PO TABS: 5.0000 mg | ORAL_TABLET | Freq: Three times a day (TID) | ORAL | Status: DC | PRN

## 2013-04-01 NOTE — Progress Notes (Signed)
Subjective:    Patient ID: Cody Grant, male    DOB: 03/31/1961, 52 y.o.   MRN: [id number]  HPI The patient is a 52 year old male, who presents with LBP, radiating into his left LE. Marland Kitchen The symptoms started after a work related injury in July 2011 ago. The patient complains about moderate pain . He describes the pain as aching and sometimes sharp . Applying heat, taking medications , changing positions alleviate the symptoms. Prolonged sitting or standing aggrevates the symptoms. The patient grades his pain as a 5 /10. The patient reports that the Flector patches have helped him, when he has an exacerbation of his symptoms.  He reports that the Robaxin also has helped to relief his Sx.  The problem has been stable  Hx of L3-L4 discectomy in September of 2012. Postoperatively his back pain got better, however his left leg pain became more severe and more constant.  Has tried Lyrica as well as amitriptyline, which he did not tolerate well. He takes 1-3 oxycodone per day.  Has had relief with physical therapy in the past, which only last for a short time.  Injection did not give him much relief.   Pain Inventory Average Pain 7 Pain Right Now 6 My pain is sharp, stabbing, tingling and aching  In the last 24 hours, has pain interfered with the following? General activity 7 Relation with others 7 Enjoyment of life 7 What TIME of day is your pain at its worst? night Sleep (in general) Fair  Pain is worse with: bending, sitting and standing Pain improves with: rest, heat/ice, pacing activities, medication and TENS Relief from Meds: 7  Mobility walk without assistance how many minutes can you walk? 15 ability to climb steps?  yes do you drive?  yes  Function disabled: date disabled . I need assistance with the following:  dressing, meal prep, household duties and shopping  Neuro/Psych numbness tingling  Prior Studies Any changes since last visit?  no  Physicians involved in  your care Any changes since last visit?  no   Family History  Problem Relation Age of Onset  . COPD Mother   . Diabetes Mother    History   Social History  . Marital Status: Unknown    Spouse Name: N/A    Number of Children: N/A  . Years of Education: N/A   Social History Main Topics  . Smoking status: Current Every Day Smoker -- 1.00 packs/day for 30 years    Types: Cigarettes  . Smokeless tobacco: Never Used  . Alcohol Use: None  . Drug Use: None  . Sexual Activity: None   Other Topics Concern  . None   Social History Narrative  . None   Past Surgical History  Procedure Laterality Date  . Spine surgery  12/2010    micro diskectomy   Past Medical History  Diagnosis Date  . Asthma   . Factor 5 Leiden mutation, heterozygous   . Clotting disorder     "has 1 count of factor 5 "   BP 123/77  Pulse 83  Resp 14  Ht 5\' 8"  (1.727 m)  Wt 182 lb 6.4 oz (82.736 kg)  BMI 27.74 kg/m2  SpO2 96%    Review of Systems  Neurological: Positive for numbness.       Tingling  All other systems reviewed and are negative.       Objective:   Physical Exam Constitutional: He is oriented to person, place, and time.  He appears well-developed and well-nourished.  HENT:  Head: Normocephalic.  Neck: Neck supple.  Musculoskeletal: He exhibits tenderness. Paraspinal on the left, and posterior hip, gluts and piriformis on the left. Neurological: He is alert and oriented to person, place, and time.  Skin: Skin is warm and dry.  Psychiatric: He has a normal mood and affect.  Symmetric normal motor tone is noted throughout. Normal muscle bulk. Muscle testing reveals 5/5 muscle strength of the upper extremity, and 5/5 of the lower extremity, except gastrocnemius on the left 4/5, and give away weakness of tibialis anterior on the left. Full range of motion in upper and lower extremities. ROM of spine is restricted. Fine motor movements are normal in both hands.  Sensory is intact and  symmetric to light touch, pinprick and proprioception.  DTR in the upper and lower extremity are present and symmetric 2+. No clonus is noted.  Patient arises from chair without difficulty. Narrow based gait with normal arm swing bilateral , able to walk on heels and toes . Tandem walk is stable. No pronator drift. Rhomberg negative.  Left foot everted when walking         Assessment & Plan:  1. Lumbar post laminectomy syndrome Status post L3-L4 laminectomy and discectomy, 12/2010. Has some evidence of L5 radiculopathy as well. No surgical intervention was recommended by the surgeon that evaluated him in [place].  Recommend ambulation 30 minutes everyday  Refilled oxycodone 5 mg, 3 times per day.He actually takes this medication sporadically, Some days 0 pills some days 4 pills.  Prescribed Robaxin for his muscle spasms/pain in his lower back at his last visit, which gives him good relief.  See the patient back in 1 month.  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

## 2013-04-01 NOTE — Patient Instructions (Signed)
Continue with your walking program. 

## 2013-04-17 DIAGNOSIS — K1321 Leukoplakia of oral mucosa, including tongue: Secondary | ICD-10-CM | POA: Diagnosis not present

## 2013-04-21 ENCOUNTER — Other Ambulatory Visit: Payer: Self-pay | Admitting: *Deleted

## 2013-04-21 MED ORDER — OXYCODONE HCL 5 MG PO TABS: 5.0000 mg | ORAL_TABLET | Freq: Three times a day (TID) | ORAL | Status: DC | PRN

## 2013-04-21 NOTE — Telephone Encounter (Signed)
RX printed early for controlled medication for the visit with RN on 05/02/13 (to be signed by MD) 

## 2013-05-02 ENCOUNTER — Encounter: Payer: Worker's Compensation | Attending: Physical Medicine & Rehabilitation | Admitting: *Deleted

## 2013-05-02 ENCOUNTER — Encounter: Payer: Self-pay | Admitting: *Deleted

## 2013-05-02 VITALS — BP 122/69 | HR 85 | Resp 14

## 2013-05-02 DIAGNOSIS — D6859 Other primary thrombophilia: Secondary | ICD-10-CM | POA: Insufficient documentation

## 2013-05-02 DIAGNOSIS — J45909 Unspecified asthma, uncomplicated: Secondary | ICD-10-CM | POA: Insufficient documentation

## 2013-05-02 DIAGNOSIS — M961 Postlaminectomy syndrome, not elsewhere classified: Secondary | ICD-10-CM | POA: Insufficient documentation

## 2013-05-02 DIAGNOSIS — F17200 Nicotine dependence, unspecified, uncomplicated: Secondary | ICD-10-CM | POA: Insufficient documentation

## 2013-05-02 DIAGNOSIS — Z79899 Other long term (current) drug therapy: Secondary | ICD-10-CM | POA: Insufficient documentation

## 2013-05-02 NOTE — Progress Notes (Signed)
Here for pill count and medication refills. Oxycodone 5 mg IR # 90 ( 1 tid) Fill date  04/11/13   Today NV#  40  VSS   Pain level: 5  Pain comes and goes and numbness sometimes is in foot only and sometimes the entire leg.  Today It is just in his foot. Pill counts are appropriate. Return to see RN in one month for pill count and med refill and 2 month with MD

## 2013-05-02 NOTE — Patient Instructions (Addendum)
Follow up one month with RN for pill count and med refill and 2 month with MD

## 2013-05-20 ENCOUNTER — Encounter: Payer: Self-pay | Admitting: Internal Medicine

## 2013-05-20 ENCOUNTER — Other Ambulatory Visit (INDEPENDENT_AMBULATORY_CARE_PROVIDER_SITE_OTHER): Payer: Medicare Other

## 2013-05-20 ENCOUNTER — Other Ambulatory Visit: Payer: Self-pay | Admitting: Internal Medicine

## 2013-05-20 ENCOUNTER — Ambulatory Visit (INDEPENDENT_AMBULATORY_CARE_PROVIDER_SITE_OTHER): Payer: Medicare Other | Admitting: Internal Medicine

## 2013-05-20 ENCOUNTER — Ambulatory Visit (INDEPENDENT_AMBULATORY_CARE_PROVIDER_SITE_OTHER)
Admission: RE | Admit: 2013-05-20 | Discharge: 2013-05-20 | Disposition: A | Discharge status: Home / Self Care | Payer: Medicare Other | Source: Ambulatory Visit | Attending: Internal Medicine | Admitting: Internal Medicine

## 2013-05-20 VITALS — BP 140/88 | HR 91 | Temp 98.2°F | Ht 68.0 in | Wt 175.0 lb

## 2013-05-20 DIAGNOSIS — J309 Allergic rhinitis, unspecified: Secondary | ICD-10-CM | POA: Insufficient documentation

## 2013-05-20 DIAGNOSIS — E785 Hyperlipidemia, unspecified: Secondary | ICD-10-CM

## 2013-05-20 DIAGNOSIS — R05 Cough: Secondary | ICD-10-CM | POA: Diagnosis not present

## 2013-05-20 DIAGNOSIS — J45909 Unspecified asthma, uncomplicated: Secondary | ICD-10-CM

## 2013-05-20 DIAGNOSIS — Z0000 Encounter for general adult medical examination without abnormal findings: Secondary | ICD-10-CM | POA: Insufficient documentation

## 2013-05-20 DIAGNOSIS — N320 Bladder-neck obstruction: Secondary | ICD-10-CM | POA: Insufficient documentation

## 2013-05-20 DIAGNOSIS — M21372 Foot drop, left foot: Secondary | ICD-10-CM | POA: Insufficient documentation

## 2013-05-20 DIAGNOSIS — D6851 Activated protein C resistance: Secondary | ICD-10-CM | POA: Insufficient documentation

## 2013-05-20 DIAGNOSIS — M5416 Radiculopathy, lumbar region: Secondary | ICD-10-CM | POA: Insufficient documentation

## 2013-05-20 DIAGNOSIS — R059 Cough, unspecified: Secondary | ICD-10-CM | POA: Diagnosis not present

## 2013-05-20 DIAGNOSIS — M519 Unspecified thoracic, thoracolumbar and lumbosacral intervertebral disc disorder: Secondary | ICD-10-CM | POA: Insufficient documentation

## 2013-05-20 DIAGNOSIS — G8929 Other chronic pain: Secondary | ICD-10-CM | POA: Insufficient documentation

## 2013-05-20 DIAGNOSIS — Z0001 Encounter for general adult medical examination with abnormal findings: Secondary | ICD-10-CM | POA: Insufficient documentation

## 2013-05-20 HISTORY — DX: Hyperlipidemia, unspecified: E78.5

## 2013-05-20 LAB — BASIC METABOLIC PANEL
BUN: 10 mg/dL (ref 6–23)
CALCIUM: 9.5 mg/dL (ref 8.4–10.5)
CO2: 27 mEq/L (ref 19–32)
Chloride: 104 mEq/L (ref 96–112)
Creatinine, Ser: 0.8 mg/dL (ref 0.4–1.5)
GFR: 110.85 mL/min (ref >60.00)
Glucose, Bld: 90 mg/dL (ref 70–99)
POTASSIUM: 4.6 meq/L (ref 3.5–5.1)
SODIUM: 138 meq/L (ref 135–145)

## 2013-05-20 LAB — PULMONARY FUNCTION TEST
DL/VA % pred: 85 %
DL/VA: 3.82 ml/min/mmHg/L
DLCO UNC: 18.01 ml/min/mmHg
DLCO unc % pred: 62 %
FEF 25-75 Post: 1.41 L/sec
FEF 25-75 Pre: 0.6 L/sec
FEF2575-%Change-Post: 134 %
FEF2575-%Pred-Post: 45 %
FEF2575-%Pred-Pre: 19 %
FEV1-%Change-Post: 39 %
FEV1-%Pred-Post: 54 %
FEV1-%Pred-Pre: 39 %
FEV1-POST: 1.95 L
FEV1-Pre: 1.39 L
FEV1FVC-%CHANGE-POST: 16 %
FEV1FVC-%Pred-Pre: 65 %
FEV6-%Change-Post: 22 %
FEV6-%PRED-PRE: 59 %
FEV6-%Pred-Post: 72 %
FEV6-Post: 3.2 L
FEV6-Pre: 2.6 L
FEV6FVC-%Change-Post: 2 %
FEV6FVC-%PRED-POST: 101 %
FEV6FVC-%Pred-Pre: 99 %
FVC-%Change-Post: 19 %
FVC-%PRED-PRE: 59 %
FVC-%Pred-Post: 71 %
FVC-Post: 3.28 L
FVC-Pre: 2.74 L
PRE FEV6/FVC RATIO: 95 %
Post FEV1/FVC ratio: 59 %
Post FEV6/FVC ratio: 97 %
Pre FEV1/FVC ratio: 51 %
RV % PRED: 254 %
RV: 4.97 L
TLC % PRED: 122 %
TLC: 7.93 L

## 2013-05-20 LAB — CBC WITH DIFFERENTIAL/PLATELET
BASOS ABS: 0.1 10*3/uL (ref 0.0–0.1)
Basophils Relative: 0.6 % (ref 0.0–3.0)
EOS ABS: 0.2 10*3/uL (ref 0.0–0.7)
Eosinophils Relative: 1.8 % (ref 0.0–5.0)
HEMATOCRIT: 44.8 % (ref 39.0–52.0)
HEMOGLOBIN: 15.1 g/dL (ref 13.0–17.0)
Lymphocytes Relative: 19.6 % (ref 12.0–46.0)
Lymphs Abs: 2.1 10*3/uL (ref 0.7–4.0)
MCHC: 33.8 g/dL (ref 30.0–36.0)
MCV: 90.8 fl (ref 78.0–100.0)
MONO ABS: 1 10*3/uL (ref 0.1–1.0)
MONOS PCT: 9.2 % (ref 3.0–12.0)
NEUTROS ABS: 7.3 10*3/uL (ref 1.4–7.7)
Neutrophils Relative %: 68.8 % (ref 43.0–77.0)
PLATELETS: 271 10*3/uL (ref 150.0–400.0)
RBC: 4.93 Mil/uL (ref 4.22–5.81)
RDW: 12.7 % (ref 11.5–14.6)
WBC: 10.6 10*3/uL — ABNORMAL HIGH (ref 4.5–10.5)

## 2013-05-20 LAB — URINALYSIS, ROUTINE W REFLEX MICROSCOPIC
BILIRUBIN URINE: NEGATIVE
HGB URINE DIPSTICK: NEGATIVE
Ketones, ur: NEGATIVE
LEUKOCYTES UA: NEGATIVE
Nitrite: NEGATIVE
RBC / HPF: NONE SEEN (ref 0–?)
SPECIFIC GRAVITY, URINE: 1.02 (ref 1.000–1.030)
Total Protein, Urine: NEGATIVE
URINE GLUCOSE: NEGATIVE
UROBILINOGEN UA: 0.2 (ref 0.0–1.0)
WBC, UA: NONE SEEN (ref 0–?)
pH: 7 (ref 5.0–8.0)

## 2013-05-20 LAB — LIPID PANEL
CHOL/HDL RATIO: 5
Cholesterol: 208 mg/dL — ABNORMAL HIGH (ref 0–200)
HDL: 38.9 mg/dL — AB (ref >39.00)
TRIGLYCERIDES: 290 mg/dL — AB (ref 0.0–149.0)
VLDL: 58 mg/dL — ABNORMAL HIGH (ref 0.0–40.0)

## 2013-05-20 LAB — HEPATIC FUNCTION PANEL
ALBUMIN: 4.4 g/dL (ref 3.5–5.2)
ALK PHOS: 58 U/L (ref 39–117)
ALT: 31 U/L (ref 0–53)
AST: 26 U/L (ref 0–37)
BILIRUBIN DIRECT: 0.1 mg/dL (ref 0.0–0.3)
Total Bilirubin: 0.8 mg/dL (ref 0.3–1.2)
Total Protein: 7.4 g/dL (ref 6.0–8.3)

## 2013-05-20 LAB — PSA: PSA: 0.79 ng/mL (ref 0.10–4.00)

## 2013-05-20 LAB — TSH: TSH: 0.77 u[IU]/mL (ref 0.35–5.50)

## 2013-05-20 LAB — LDL CHOLESTEROL, DIRECT: Direct LDL: 129.8 mg/dL

## 2013-05-20 MED ORDER — ATORVASTATIN CALCIUM 10 MG PO TABS: 10.0000 mg | ORAL_TABLET | Freq: Every day | ORAL | Status: DC

## 2013-05-20 NOTE — Progress Notes (Signed)
Pre-visit discussion using our clinic review tool. No additional management support is needed unless otherwise documented below in the visit note.  

## 2013-05-20 NOTE — Patient Instructions (Addendum)
Your EKG was OK today Please continue all other medications as before, and refills have been done if requested. Please have the pharmacy call with any other refills you may need. Please continue your efforts at being more active, low cholesterol diet, and weight control. You are otherwise up to date with prevention measures today. Please keep your appointments with your specialists as you have planned - pain management  You will be contacted regarding the referral for: Pulmonary function tests  Please go to the XRAY Department in the Basement (go straight as you get off the elevator) for the x-ray testing Please go to the LAB in the Basement (turn left off the elevator) for the tests to be done today You will be contacted by phone if any changes need to be made immediately.  Otherwise, you will receive a letter about your results with an explanation, but please check with MyChart first.  Please remember to sign up for My Chart if you have not done so, as this will be important to you in the future with finding out test results, communicating by private email, and scheduling acute appointments online when needed.  Please return in 6 months, or sooner if needed

## 2013-05-20 NOTE — Assessment & Plan Note (Signed)
Also for psa as he is due 

## 2013-05-20 NOTE — Progress Notes (Signed)
Subjective:    Patient ID: Cody Grant, male    DOB: 11/14/1960, 53 y.o.   MRN: [id number]  HPI Here to establish as new pt;  Overall doing ok;  Pt denies CP, DOE, wheezing, orthopnea, PND, worsening LE edema, palpitations, dizziness or syncope, though has occas vague sob, ongoing am prod cough and asks about PFT's as mother had COPD. Not yet able to quit, thinking about ecigs but not committed yet.  Pt denies neurological change such as new headache, facial or extremity weakness, though is disabled due to left postlaminectomy syndrome, left foot drop, chronic pain, to see Dr Delynn Flavin soon per pt as recent provider has moved.   Pt denies polydipsia, polyuria, or low sugar symptoms. Pt states overall good compliance with treatment and medications, good tolerability, and has been trying to follow lower cholesterol diet.  Pt denies worsening depressive symptoms, suicidal ideation or panic. No fever, night sweats, wt loss, loss of appetite, or other constitutional symptoms.  Pt states good ability with ADL's, has low fall risk, home safety reviewed and adequate, no other significant changes in hearing or vision, and only occasionally active with exercise. Declines flu, tetanus today.  Due for colonoscopy. No labs recent. Past Medical History  Diagnosis Date  . Asthma     as child  . Factor 5 Leiden mutation, heterozygous   . Allergic rhinitis, cause unspecified   . Lumbar disc disease   . Left foot drop   . Chronic lumbar radiculopathy     left with left drop foot/numbness  . Chronic pain     to see Dr Delynn Flavin   Past Surgical History  Procedure Laterality Date  . Spine surgery  12/2010    micro diskectomy    reports that he has been smoking Cigarettes.  He has a 30 pack-year smoking history. He has never used smokeless tobacco. He reports that he drinks alcohol. He reports that he does not use illicit drugs. family history includes COPD in his mother; Diabetes in his mother; Pulmonary  embolism in his father. No Known Allergies Current Outpatient Prescriptions on File Prior to Visit  Medication Sig Dispense Refill  . diclofenac (FLECTOR) 1.3 % PTCH Place 1 patch onto the skin 2 (two) times daily.  30 patch  2  . ibuprofen (ADVIL,MOTRIN) 400 MG tablet Take 1 tablet (400 mg total) by mouth every 8 (eight) hours as needed for pain.  90 tablet  1  . methocarbamol (ROBAXIN) 500 MG tablet Take 1 tablet (500 mg total) by mouth 3 (three) times daily.  60 tablet  1  . oxyCODONE (OXY IR/ROXICODONE) 5 MG immediate release tablet Take 1 tablet (5 mg total) by mouth every 8 (eight) hours as needed.  90 tablet  0  . PROAIR HFA 108 (90 BASE) MCG/ACT inhaler        No current facility-administered medications on file prior to visit.   Review of Systems  Constitutional: Negative for unexpected weight change, or unusual diaphoresis  HENT: Negative for tinnitus.   Eyes: Negative for photophobia and visual disturbance.  Respiratory: Negative for choking and stridor.   Gastrointestinal: Negative for vomiting and blood in stool.  Genitourinary: Negative for hematuria and decreased urine volume.  Musculoskeletal: Negative for acute joint swelling Skin: Negative for color change and wound.  Neurological: Negative for tremors and numbness other than noted  Psychiatric/Behavioral: Negative for decreased concentration or  hyperactivity.       Objective:   Physical Exam BP 140/88  Pulse  91  Temp(Src) 98.2 F (36.8 C) (Oral)  Ht 5\' 8"  (1.727 m)  Wt 175 lb (79.379 kg)  BMI 26.61 kg/m2  SpO2 95% VS noted,  Constitutional: Pt appears well-developed and well-nourished.  HENT: Head: NCAT.  Right Ear: External ear normal.  Left Ear: External ear normal.  Eyes: Conjunctivae and EOM are normal. Pupils are equal, round, and reactive to light.  Neck: Normal range of motion. Neck supple.  Cardiovascular: Normal rate and regular rhythm.   Pulmonary/Chest: Effort normal and breath sounds normal.    Abd:  Soft, NT, non-distended, + BS Neurological: Pt is alert. Not confused  Skin: Skin is warm. No erythema.  Psychiatric: Pt behavior is normal. Thought content normal.     Assessment & Plan:

## 2013-05-20 NOTE — Assessment & Plan Note (Signed)
prob smoker cough, for cxr, also ecg today, and for PFT's

## 2013-05-20 NOTE — Assessment & Plan Note (Signed)
O/w stable overall by history and exam, recent data reviewed with pt, and pt to continue medical treatment as before,  to f/u any worsening symptoms or concerns SpO2 Readings from Last 3 Encounters:  05/20/13 95%  05/02/13 96%  04/01/13 96%

## 2013-05-20 NOTE — Assessment & Plan Note (Signed)
For f/u labs today 

## 2013-05-21 ENCOUNTER — Telehealth: Payer: Self-pay | Admitting: *Deleted

## 2013-05-21 MED ORDER — ATORVASTATIN CALCIUM 10 MG PO TABS: 10.0000 mg | ORAL_TABLET | Freq: Every day | ORAL | Status: DC

## 2013-05-21 NOTE — Telephone Encounter (Signed)
Patient called and stated his Liptor was sent to the wrong pharmacy. Patient states that he now uses [facility] on [address]. Patient would ike for his Liptor be resent to the correct pharmacy. Rx was was sent to correct pharmacy.

## 2013-05-26 ENCOUNTER — Other Ambulatory Visit: Payer: Self-pay | Admitting: *Deleted

## 2013-05-26 MED ORDER — OXYCODONE HCL 5 MG PO TABS: 5.0000 mg | ORAL_TABLET | Freq: Three times a day (TID) | ORAL | Status: DC | PRN

## 2013-05-26 NOTE — Telephone Encounter (Signed)
RX printed early for controlled medication for the visit with RN on 06/02/13 (to be signed by MD)

## 2013-06-02 ENCOUNTER — Encounter: Payer: Worker's Compensation | Attending: Physical Medicine & Rehabilitation | Admitting: *Deleted

## 2013-06-02 ENCOUNTER — Encounter: Payer: Self-pay | Admitting: *Deleted

## 2013-06-02 VITALS — BP 110/71 | HR 72 | Resp 14 | Ht 68.0 in | Wt 176.2 lb

## 2013-06-02 DIAGNOSIS — Z79899 Other long term (current) drug therapy: Secondary | ICD-10-CM | POA: Insufficient documentation

## 2013-06-02 DIAGNOSIS — M5416 Radiculopathy, lumbar region: Secondary | ICD-10-CM

## 2013-06-02 DIAGNOSIS — M961 Postlaminectomy syndrome, not elsewhere classified: Secondary | ICD-10-CM | POA: Insufficient documentation

## 2013-06-02 DIAGNOSIS — IMO0002 Reserved for concepts with insufficient information to code with codable children: Secondary | ICD-10-CM | POA: Insufficient documentation

## 2013-06-02 DIAGNOSIS — M51379 Other intervertebral disc degeneration, lumbosacral region without mention of lumbar back pain or lower extremity pain: Secondary | ICD-10-CM | POA: Insufficient documentation

## 2013-06-02 DIAGNOSIS — M519 Unspecified thoracic, thoracolumbar and lumbosacral intervertebral disc disorder: Secondary | ICD-10-CM

## 2013-06-02 DIAGNOSIS — M5137 Other intervertebral disc degeneration, lumbosacral region: Secondary | ICD-10-CM | POA: Insufficient documentation

## 2013-06-02 DIAGNOSIS — G8929 Other chronic pain: Secondary | ICD-10-CM

## 2013-06-02 NOTE — Progress Notes (Signed)
Here for pill count and medication refills. Oxycodone 5 mg # 82 Fill date  05/11/13  Today NV# 33  VSS    Mr Cody Grant had some dental work done in December and I reviewed with him our controlled substance agreement about getting narcotics from any other Dr which includes dentist.  He received #20 after having his wisdom teeth removed.  He was not aware that he had done anything wrong but said he did not take the oxycodone while taking the hydrocodone for the tooth extraction pain. I made it clear that he is to notify us in the future of any dental extractions that would possibly result in requiring pain medication because he must have that cleared through our office. He verbalized understanding and will follow this policy.His pill counts were appropriate and his refill was given.  Return to see Dr Letta Pate in one month.

## 2013-06-02 NOTE — Patient Instructions (Signed)
Fu Dr Letta Pate 06/30/13 @12 :53

## 2013-06-19 ENCOUNTER — Ambulatory Visit (INDEPENDENT_AMBULATORY_CARE_PROVIDER_SITE_OTHER): Payer: Medicare Other | Admitting: Internal Medicine

## 2013-06-19 DIAGNOSIS — R05 Cough: Secondary | ICD-10-CM | POA: Diagnosis not present

## 2013-06-19 DIAGNOSIS — R059 Cough, unspecified: Secondary | ICD-10-CM

## 2013-06-19 NOTE — Progress Notes (Signed)
PFT done today. 

## 2013-06-22 ENCOUNTER — Encounter: Payer: Self-pay | Admitting: Internal Medicine

## 2013-06-22 ENCOUNTER — Telehealth: Payer: Self-pay | Admitting: Internal Medicine

## 2013-06-22 DIAGNOSIS — J449 Chronic obstructive pulmonary disease, unspecified: Secondary | ICD-10-CM | POA: Insufficient documentation

## 2013-06-22 HISTORY — DX: Chronic obstructive pulmonary disease, unspecified: J44.9

## 2013-06-22 MED ORDER — TIOTROPIUM BROMIDE MONOHYDRATE 18 MCG IN CAPS: 18.0000 ug | ORAL_CAPSULE | Freq: Every day | RESPIRATORY_TRACT | Status: DC

## 2013-06-22 NOTE — Telephone Encounter (Signed)
Assumption for robin to contact pt; PFT's (pulm fxn test) c/w severe copd  Ok for spiriva - done erx

## 2013-06-24 NOTE — Telephone Encounter (Signed)
Patient informed of results and scripts sent in.

## 2013-06-30 ENCOUNTER — Encounter: Payer: Worker's Compensation | Attending: Physical Medicine and Rehabilitation

## 2013-06-30 ENCOUNTER — Ambulatory Visit (HOSPITAL_BASED_OUTPATIENT_CLINIC_OR_DEPARTMENT_OTHER): Payer: Worker's Compensation | Admitting: Physical Medicine & Rehabilitation

## 2013-06-30 ENCOUNTER — Encounter: Payer: Self-pay | Admitting: Physical Medicine & Rehabilitation

## 2013-06-30 VITALS — BP 109/83 | HR 88 | Resp 14 | Ht 68.0 in | Wt 175.0 lb

## 2013-06-30 DIAGNOSIS — D6859 Other primary thrombophilia: Secondary | ICD-10-CM | POA: Insufficient documentation

## 2013-06-30 DIAGNOSIS — M519 Unspecified thoracic, thoracolumbar and lumbosacral intervertebral disc disorder: Secondary | ICD-10-CM

## 2013-06-30 DIAGNOSIS — M545 Low back pain, unspecified: Secondary | ICD-10-CM | POA: Insufficient documentation

## 2013-06-30 DIAGNOSIS — IMO0002 Reserved for concepts with insufficient information to code with codable children: Secondary | ICD-10-CM

## 2013-06-30 DIAGNOSIS — M961 Postlaminectomy syndrome, not elsewhere classified: Secondary | ICD-10-CM | POA: Insufficient documentation

## 2013-06-30 DIAGNOSIS — M5416 Radiculopathy, lumbar region: Secondary | ICD-10-CM

## 2013-06-30 DIAGNOSIS — F17200 Nicotine dependence, unspecified, uncomplicated: Secondary | ICD-10-CM | POA: Insufficient documentation

## 2013-06-30 MED ORDER — OXYCODONE HCL 5 MG PO TABS: 5.0000 mg | ORAL_TABLET | Freq: Three times a day (TID) | ORAL | Status: DC | PRN

## 2013-06-30 NOTE — Progress Notes (Signed)
Subjective:    Patient ID: Cody Grant, male    DOB: 01-23-1961, 53 y.o.   MRN: [id number]  HPI Quitting smoking Foot numness a couple times a month [place] a couple weeks ago , mid back was hurting but back to normal now  Using TENS unit at times Using Robaxin at times no refill needed Using ibuprofen in combination with oxycodone Using flector patches at times Pain Inventory Average Pain 6 Pain Right Now 4 My pain is sharp, stabbing and tingling  In the last 24 hours, has pain interfered with the following? General activity 6 Relation with others 4 Enjoyment of life 5 What TIME of day is your pain at its worst? morning, evening Sleep (in general) Fair  Pain is worse with: bending, sitting and standing Pain improves with: heat/ice, pacing activities, medication and TENS Relief from Meds: 7  Mobility walk without assistance how many minutes can you walk? 15 ability to climb steps?  yes do you drive?  yes transfers alone  Function disabled: date disabled 05/19/2010 I need assistance with the following:  dressing, household duties and shopping  Neuro/Psych numbness tingling trouble walking  Prior Studies Any changes since last visit?  no  Physicians involved in your care Any changes since last visit?  no   Family History  Problem Relation Age of Onset  . COPD Mother   . Diabetes Mother   . Pulmonary embolism Father    History   Social History  . Marital Status: Unknown    Spouse Name: N/A    Number of Children: N/A  . Years of Education: N/A   Occupational History  . Disabled    Social History Main Topics  . Smoking status: Current Every Day Smoker -- 1.00 packs/day for 30 years    Types: Cigarettes  . Smokeless tobacco: Never Used  . Alcohol Use: Yes  . Drug Use: No  . Sexual Activity: None   Other Topics Concern  . None   Social History Narrative  . None   Past Surgical History  Procedure Laterality Date  . Spine surgery  12/2010    micro diskectomy   Past Medical History  Diagnosis Date  . Asthma     as child  . Factor 5 Leiden mutation, heterozygous   . Allergic rhinitis, cause unspecified   . Lumbar disc disease   . Left foot drop   . Chronic lumbar radiculopathy     left with left drop foot/numbness  . Chronic pain     to see Dr Delynn Flavin  . Other and unspecified hyperlipidemia 05/20/2013  . COPD, severe 06/22/2013   BP 109/83  Pulse 88  Resp 14  Ht 5\' 8"  (1.727 m)  Wt 175 lb (79.379 kg)  BMI 26.61 kg/m2  SpO2 97%  Opioid Risk Score:   Fall Risk Score: Moderate Fall Risk (6-13 points) (pt educated on fall risk, brochure given to pt)   Review of Systems  Respiratory: Positive for cough, shortness of breath and wheezing.   Musculoskeletal: Positive for back pain and gait problem.  Neurological: Positive for numbness.       Tingling   All other systems reviewed and are negative.       Objective:   Physical Exam  Left side low back sore with right lateral bending Reduced lumbar ROM Dupuytren's contracture bilateral SLR neg Normal strength Normal hip range of motion     Assessment & Plan:  1.Patient had questions regarding back exercises. I printed out  some additional instructions and went over the individual exercises with the patient. Lumbar post laminectomy syndrome Status post L3-L4 laminectomy and discectomy. Has some evidence of L5 radiculopathy as well. Left foraminal disc protrusion which is known. No surgical intervention was recommended by the surgeon that evaluated him in [place].     Hip pain when ambulating 15-20 min Oxycodone 5mg  TID Continue opioid monitoring program. This consists of regular clinic visits, examinations, urine drug screen, pill counts as well as use of [place] controlled substance reporting System.  Over half of the 25 min visit was spent counseling and coordinating care.

## 2013-06-30 NOTE — Patient Instructions (Addendum)
[employer]  Back Exercises These exercises may help you when beginning to rehabilitate your injury. Your symptoms may resolve with or without further involvement from your physician, physical therapist or athletic trainer. While completing these exercises, remember:   Restoring tissue flexibility helps normal motion to return to the joints. This allows healthier, less painful movement and activity.  An effective stretch should be held for at least 30 seconds.  A stretch should never be painful. You should only feel a gentle lengthening or release in the stretched tissue. STRETCH  Extension, Prone on Elbows   Lie on your stomach on the floor, a bed will be too soft. Place your palms about shoulder width apart and at the height of your head.  Place your elbows under your shoulders. If this is too painful, stack pillows under your chest.  Allow your body to relax so that your hips drop lower and make contact more completely with the floor.  Hold this position for __________ seconds.  Slowly return to lying flat on the floor. Repeat __________ times. Complete this exercise __________ times per day.  RANGE OF MOTION  Extension, Prone Press Ups   Lie on your stomach on the floor, a bed will be too soft. Place your palms about shoulder width apart and at the height of your head.  Keeping your back as relaxed as possible, slowly straighten your elbows while keeping your hips on the floor. You may adjust the placement of your hands to maximize your comfort. As you gain motion, your hands will come more underneath your shoulders.  Hold this position __________ seconds.  Slowly return to lying flat on the floor. Repeat __________ times. Complete this exercise __________ times per day.  RANGE OF MOTION- Quadruped, Neutral Spine   Assume a hands and knees position on a firm surface. Keep your hands under your shoulders and your knees under your hips. You may place padding  under your knees for comfort.  Drop your head and point your tail bone toward the ground below you. This will round out your low back like an angry cat. Hold this position for __________ seconds.  Slowly lift your head and release your tail bone so that your back sags into a large arch, like an old horse.  Hold this position for __________ seconds.  Repeat this until you feel limber in your low back.  Now, find your "sweet spot." This will be the most comfortable position somewhere between the two previous positions. This is your neutral spine. Once you have found this position, tense your stomach muscles to support your low back.  Hold this position for __________ seconds. Repeat __________ times. Complete this exercise __________ times per day.  STRETCH  Flexion, Single Knee to Chest   Lie on a firm bed or floor with both legs extended in front of you.  Keeping one leg in contact with the floor, bring your opposite knee to your chest. Hold your leg in place by either grabbing behind your thigh or at your knee.  Pull until you feel a gentle stretch in your low back. Hold __________ seconds.  Slowly release your grasp and repeat the exercise with the opposite side. Repeat __________ times. Complete this exercise __________ times per day.  STRETCH - Hamstrings, Standing  Stand or sit and extend your right / left leg, placing your foot on a chair or foot stool  Keeping a slight arch in your low back and your hips straight forward.  Lead with your chest and lean forward at the waist until you feel a gentle stretch in the back of your right / left knee or thigh. (When done correctly, this exercise requires leaning only a small distance.)  Hold this position for __________ seconds. Repeat __________ times. Complete this stretch __________ times per day. STRENGTHENING  Deep Abdominals, Pelvic Tilt   Lie on a firm bed or floor. Keeping your legs in front of you, bend your knees so they  are both pointed toward the ceiling and your feet are flat on the floor.  Tense your lower abdominal muscles to press your low back into the floor. This motion will rotate your pelvis so that your tail bone is scooping upwards rather than pointing at your feet or into the floor.  With a gentle tension and even breathing, hold this position for __________ seconds. Repeat __________ times. Complete this exercise __________ times per day.  STRENGTHENING  Abdominals, Crunches   Lie on a firm bed or floor. Keeping your legs in front of you, bend your knees so they are both pointed toward the ceiling and your feet are flat on the floor. Cross your arms over your chest.  Slightly tip your chin down without bending your neck.  Tense your abdominals and slowly lift your trunk high enough to just clear your shoulder blades. Lifting higher can put excessive stress on the low back and does not further strengthen your abdominal muscles.  Control your return to the starting position. Repeat __________ times. Complete this exercise __________ times per day.  STRENGTHENING  Quadruped, Opposite UE/LE Lift   Assume a hands and knees position on a firm surface. Keep your hands under your shoulders and your knees under your hips. You may place padding under your knees for comfort.  Find your neutral spine and gently tense your abdominal muscles so that you can maintain this position. Your shoulders and hips should form a rectangle that is parallel with the floor and is not twisted.  Keeping your trunk steady, lift your right hand no higher than your shoulder and then your left leg no higher than your hip. Make sure you are not holding your breath. Hold this position __________ seconds.  Continuing to keep your abdominal muscles tense and your back steady, slowly return to your starting position. Repeat with the opposite arm and leg. Repeat __________ times. Complete this exercise __________ times per  day. Document Released: 05/05/2005 Document Revised: 07/10/2011 Document Reviewed: 07/30/2008 [employer] Patient Information 2014 [place], [place].

## 2013-08-07 ENCOUNTER — Encounter: Payer: Self-pay | Admitting: Registered Nurse

## 2013-08-07 ENCOUNTER — Ambulatory Visit: Payer: Self-pay | Admitting: Physical Medicine and Rehabilitation

## 2013-08-07 ENCOUNTER — Encounter: Payer: Worker's Compensation | Attending: Physical Medicine and Rehabilitation | Admitting: Registered Nurse

## 2013-08-07 VITALS — BP 114/72 | HR 97 | Resp 14 | Ht 68.0 in | Wt 172.0 lb

## 2013-08-07 DIAGNOSIS — D6859 Other primary thrombophilia: Secondary | ICD-10-CM | POA: Insufficient documentation

## 2013-08-07 DIAGNOSIS — F17200 Nicotine dependence, unspecified, uncomplicated: Secondary | ICD-10-CM | POA: Insufficient documentation

## 2013-08-07 DIAGNOSIS — M5416 Radiculopathy, lumbar region: Secondary | ICD-10-CM

## 2013-08-07 DIAGNOSIS — M519 Unspecified thoracic, thoracolumbar and lumbosacral intervertebral disc disorder: Secondary | ICD-10-CM

## 2013-08-07 DIAGNOSIS — G8929 Other chronic pain: Secondary | ICD-10-CM | POA: Insufficient documentation

## 2013-08-07 DIAGNOSIS — M961 Postlaminectomy syndrome, not elsewhere classified: Secondary | ICD-10-CM

## 2013-08-07 DIAGNOSIS — E785 Hyperlipidemia, unspecified: Secondary | ICD-10-CM | POA: Insufficient documentation

## 2013-08-07 DIAGNOSIS — IMO0002 Reserved for concepts with insufficient information to code with codable children: Secondary | ICD-10-CM

## 2013-08-07 DIAGNOSIS — J449 Chronic obstructive pulmonary disease, unspecified: Secondary | ICD-10-CM | POA: Insufficient documentation

## 2013-08-07 DIAGNOSIS — J4489 Other specified chronic obstructive pulmonary disease: Secondary | ICD-10-CM | POA: Insufficient documentation

## 2013-08-07 MED ORDER — OXYCODONE HCL 5 MG PO TABS: 5.0000 mg | ORAL_TABLET | Freq: Three times a day (TID) | ORAL | Status: DC | PRN

## 2013-08-07 NOTE — Patient Instructions (Signed)
Educated on the importance of medication compliance. Your 5 days early for medication. You won't be able to fill your script until 08/12/13.  Continue with your back exercises as tolerated.

## 2013-08-07 NOTE — Progress Notes (Signed)
Subjective:    Patient ID: Cody Grant, male    DOB: March 26, 1961, 53 y.o.   MRN: [id number]  HPI: [name] is a 53 year old male who returns for follow up for chronic pain and medication refill. He says his pain has increased in intensity for the last month. He admits he increased his OxyCodone to four times a day even though its prescribed every 8 hours. He states" had a bad month". He was educated on medication compliance and verbalizes understanding. His prescription won't be filled before 08/12/13, he's aware. He alternate's using Ibuprofen, Flector, Robaxin and heat therapy. Using his TENS unit. He rates his pain 5/10. His current exercise regime is walking. He walks around his property and walks a block once a day.   Pain Inventory Average Pain 6 Pain Right Now 5 My pain is sharp, stabbing and tingling  In the last 24 hours, has pain interfered with the following? General activity 5 Relation with others 4 Enjoyment of life 4 What TIME of day is your pain at its worst? evening Sleep (in general) Fair  Pain is worse with: walking, bending, sitting and standing Pain improves with: rest, heat/ice, pacing activities, medication and TENS Relief from Meds: n/a  Mobility walk without assistance how many minutes can you walk? 15 ability to climb steps?  yes do you drive?  yes transfers alone  Function disabled: date disabled 05/2010 I need assistance with the following:  dressing, meal prep, household duties and shopping  Neuro/Psych numbness tingling  Prior Studies Any changes since last visit?  no  Physicians involved in your care Any changes since last visit?  no   Family History  Problem Relation Age of Onset  . COPD Mother   . Diabetes Mother   . Pulmonary embolism Father    History   Social History  . Marital Status: Unknown    Spouse Name: N/A    Number of Children: N/A  . Years of Education: N/A   Occupational History  . Disabled     Social History Main Topics  . Smoking status: Current Every Day Smoker -- 1.00 packs/day for 30 years    Types: Cigarettes  . Smokeless tobacco: Never Used  . Alcohol Use: Yes  . Drug Use: No  . Sexual Activity: None   Other Topics Concern  . None   Social History Narrative  . None   Past Surgical History  Procedure Laterality Date  . Spine surgery  12/2010    micro diskectomy   Past Medical History  Diagnosis Date  . Asthma     as child  . Factor 5 Leiden mutation, heterozygous   . Allergic rhinitis, cause unspecified   . Lumbar disc disease   . Left foot drop   . Chronic lumbar radiculopathy     left with left drop foot/numbness  . Chronic pain     to see Dr Delynn Flavin  . Other and unspecified hyperlipidemia 05/20/2013  . COPD, severe 06/22/2013   BP 114/72  Pulse 97  Resp 14  Ht 5\' 8"  (1.727 m)  Wt 172 lb (78.019 kg)  BMI 26.16 kg/m2  SpO2 94%  Opioid Risk Score:   Fall Risk Score: Moderate Fall Risk (6-13 points) (patient educated handout declined)   Review of Systems  Respiratory: Positive for cough, shortness of breath and wheezing.   Neurological: Positive for numbness.       Tingling  All other systems reviewed and are negative.  Objective:   Physical Exam  Nursing note and vitals reviewed. Constitutional: He appears well-developed.  HENT:  Head: Normocephalic.  Neck: Normal range of motion.  Cardiovascular: Normal rate and regular rhythm.   Pulmonary/Chest: Effort normal and breath sounds normal.  Musculoskeletal:  Norma Muscle Bulk Muscle Testing Reveals Upper Extremities Strength 5/5.  Left Lumbar paraspinous muscles tender with palpation and Left Hip with tingling sensation.  Left leg flexion : tingling sensation Right leg flexion: ROM:WNL          Assessment & Plan:  Lumbar post laminectomy syndrome Status post L3-L4 laminectomy and discectomy. Has some evidence of L5 radiculopathy as well. Left foraminal disc protrusion  which is known. No surgical intervention was recommended by the surgeon that evaluated: Refilled: oxyCodone 5mg # 90  20 minutes of face to face patient care time was spent during this visit. All questions were encouraged and answered.

## 2013-08-15 ENCOUNTER — Telehealth: Payer: Self-pay

## 2013-08-15 NOTE — Telephone Encounter (Signed)
Patient called requesting a letter of medical necessity be sent to his workers comp so he can get his oxycodone paid for.  Left message for patients case manager to call office regarding this issue.

## 2013-09-01 ENCOUNTER — Other Ambulatory Visit: Payer: Self-pay | Admitting: *Deleted

## 2013-09-01 MED ORDER — METHOCARBAMOL 500 MG PO TABS: 500.0000 mg | ORAL_TABLET | Freq: Three times a day (TID) | ORAL | Status: DC

## 2013-09-04 ENCOUNTER — Encounter: Payer: Medicare Other | Admitting: Registered Nurse

## 2013-09-08 ENCOUNTER — Encounter: Payer: Self-pay | Admitting: Registered Nurse

## 2013-09-08 ENCOUNTER — Telehealth: Payer: Self-pay

## 2013-09-08 ENCOUNTER — Encounter: Payer: Worker's Compensation | Attending: Registered Nurse | Admitting: Registered Nurse

## 2013-09-08 VITALS — BP 131/79 | HR 82 | Resp 14 | Ht 68.0 in | Wt 170.0 lb

## 2013-09-08 DIAGNOSIS — F17200 Nicotine dependence, unspecified, uncomplicated: Secondary | ICD-10-CM | POA: Insufficient documentation

## 2013-09-08 DIAGNOSIS — M519 Unspecified thoracic, thoracolumbar and lumbosacral intervertebral disc disorder: Secondary | ICD-10-CM | POA: Insufficient documentation

## 2013-09-08 DIAGNOSIS — Z5181 Encounter for therapeutic drug level monitoring: Secondary | ICD-10-CM | POA: Insufficient documentation

## 2013-09-08 DIAGNOSIS — J449 Chronic obstructive pulmonary disease, unspecified: Secondary | ICD-10-CM | POA: Diagnosis not present

## 2013-09-08 DIAGNOSIS — IMO0002 Reserved for concepts with insufficient information to code with codable children: Secondary | ICD-10-CM

## 2013-09-08 DIAGNOSIS — Z79899 Other long term (current) drug therapy: Secondary | ICD-10-CM | POA: Insufficient documentation

## 2013-09-08 DIAGNOSIS — M961 Postlaminectomy syndrome, not elsewhere classified: Secondary | ICD-10-CM | POA: Diagnosis not present

## 2013-09-08 DIAGNOSIS — D6859 Other primary thrombophilia: Secondary | ICD-10-CM | POA: Insufficient documentation

## 2013-09-08 DIAGNOSIS — M5416 Radiculopathy, lumbar region: Secondary | ICD-10-CM

## 2013-09-08 DIAGNOSIS — J4489 Other specified chronic obstructive pulmonary disease: Secondary | ICD-10-CM | POA: Insufficient documentation

## 2013-09-08 MED ORDER — OXYCODONE HCL 5 MG PO TABS: 5.0000 mg | ORAL_TABLET | Freq: Three times a day (TID) | ORAL | Status: DC | PRN

## 2013-09-08 NOTE — Progress Notes (Signed)
Subjective:    Patient ID: Cody Grant, male    DOB: 05-11-1960, 53 y.o.   MRN: [id number]  HPI: Cody Grant is a 53 year old male who returns for follow up for chronic pain and medication refill. He says his pain is located in his lower back and left lateral extremity. At times he has numbness and tingling in left foot. He rates his pain 5. His current exercise regimen is walking and performing stretching exercises.Marland Kitchen He uses his TENS Unit and heat therapy.  Pain Inventory Average Pain 6 Pain Right Now 5 My pain is sharp, stabbing and tingling  In the last 24 hours, has pain interfered with the following? General activity 6 Relation with others 5 Enjoyment of life 6 What TIME of day is your pain at its worst? evening Sleep (in general) Fair  Pain is worse with: walking, bending, sitting and standing Pain improves with: rest, heat/ice, pacing activities, TENS and injections Relief from Meds: 6  Mobility walk without assistance how many minutes can you walk? 20 ability to climb steps?  yes do you drive?  yes transfers alone  Function not employed: date last employed 10/2009 disabled: date disabled 03/2011 I need assistance with the following:  dressing, meal prep, household duties and shopping  Neuro/Psych numbness tingling  Prior Studies Any changes since last visit?  no  Physicians involved in your care Any changes since last visit?  no   Family History  Problem Relation Age of Onset  . COPD Mother   . Diabetes Mother   . Pulmonary embolism Father    History   Social History  . Marital Status: Unknown    Spouse Name: N/A    Number of Children: N/A  . Years of Education: N/A   Occupational History  . Disabled    Social History Main Topics  . Smoking status: Current Every Day Smoker -- 1.00 packs/day for 30 years    Types: Cigarettes  . Smokeless tobacco: Never Used  . Alcohol Use: Yes  . Drug Use: No  . Sexual Activity: None   Other  Topics Concern  . None   Social History Narrative  . None   Past Surgical History  Procedure Laterality Date  . Spine surgery  12/2010    micro diskectomy   Past Medical History  Diagnosis Date  . Asthma     as child  . Factor 5 Leiden mutation, heterozygous   . Allergic rhinitis, cause unspecified   . Lumbar disc disease   . Left foot drop   . Chronic lumbar radiculopathy     left with left drop foot/numbness  . Chronic pain     to see Dr Delynn Flavin  . Other and unspecified hyperlipidemia 05/20/2013  . COPD, severe 06/22/2013   BP 131/79  Pulse 82  Resp 14  Ht 5\' 8"  (1.727 m)  Wt 170 lb (77.111 kg)  BMI 25.85 kg/m2  SpO2 97%  Opioid Risk Score:   Fall Risk Score: Moderate Fall Risk (6-13 points) (patient educated handout declined)   Review of Systems  Respiratory: Positive for cough and wheezing.   Neurological: Positive for numbness.  All other systems reviewed and are negative.      Objective:   Physical Exam  Nursing note and vitals reviewed. Constitutional: He is oriented to person, place, and time. He appears well-developed and well-nourished.  HENT:  Head: Normocephalic.  Neck: Normal range of motion. Neck supple.  Cardiovascular: Normal rate, regular rhythm and  normal heart sounds.   Pulmonary/Chest: Effort normal and breath sounds normal.  Musculoskeletal:  Normal Muscle Bulk: Muscle Testing Reveals: Upper Extremities: Full ROM and Muscle Strength 5/5. Spine Flexion 35 degrees Lumbar paraspinal Tenderness: L-4- S-1 Left Gluteal Maximius Tenderness. Lower Extremities: Right Lower Extremity: Full ROM and Muscle Strength 5/5. Left Lower Extremity: Flexion produces tingling and numbness in left foot. Arises from chair with ease. Narrow based gait.  Neurological: He is alert and oriented to person, place, and time.  Skin: Skin is warm and dry.  Psychiatric: He has a normal mood and affect.          Assessment & Plan:  1.Lumbar post laminectomy  syndrome Status post L3-L4 laminectomy and discectomy. Has some evidence of L5 radiculopathy as well. Left foraminal disc protrusion which is known. Refilled: oxyCodone 5mg  one tablet every 8 hours as needed. # 90   20 minutes of face to face patient care time was spent during this visit. All questions were encouraged and answered.  F/U in 1 month

## 2013-09-08 NOTE — Telephone Encounter (Signed)
Left message for Jacqulyn Bath regarding patient case #[id number].  Per patient his workers comp is supposed to pay for his oxycodone.  He had to pay out of pocket last month.  Per patient he was told by his attorney that his provider needed to contact case manager to approve medication.  This is the second attempt to contact case manager.  Message left states medication patient is still taking and that he will continue to need it.

## 2013-10-06 ENCOUNTER — Encounter: Payer: Worker's Compensation | Attending: Registered Nurse | Admitting: Registered Nurse

## 2013-10-06 ENCOUNTER — Encounter: Payer: Self-pay | Admitting: Registered Nurse

## 2013-10-06 VITALS — BP 111/71 | HR 77 | Resp 14 | Ht 68.0 in | Wt 170.0 lb

## 2013-10-06 DIAGNOSIS — Z5181 Encounter for therapeutic drug level monitoring: Secondary | ICD-10-CM

## 2013-10-06 DIAGNOSIS — M961 Postlaminectomy syndrome, not elsewhere classified: Secondary | ICD-10-CM

## 2013-10-06 DIAGNOSIS — J449 Chronic obstructive pulmonary disease, unspecified: Secondary | ICD-10-CM | POA: Diagnosis not present

## 2013-10-06 DIAGNOSIS — M5416 Radiculopathy, lumbar region: Secondary | ICD-10-CM

## 2013-10-06 DIAGNOSIS — F17200 Nicotine dependence, unspecified, uncomplicated: Secondary | ICD-10-CM | POA: Diagnosis not present

## 2013-10-06 DIAGNOSIS — IMO0002 Reserved for concepts with insufficient information to code with codable children: Secondary | ICD-10-CM

## 2013-10-06 DIAGNOSIS — Z79899 Other long term (current) drug therapy: Secondary | ICD-10-CM

## 2013-10-06 DIAGNOSIS — D6859 Other primary thrombophilia: Secondary | ICD-10-CM | POA: Insufficient documentation

## 2013-10-06 DIAGNOSIS — M519 Unspecified thoracic, thoracolumbar and lumbosacral intervertebral disc disorder: Secondary | ICD-10-CM

## 2013-10-06 DIAGNOSIS — J4489 Other specified chronic obstructive pulmonary disease: Secondary | ICD-10-CM | POA: Insufficient documentation

## 2013-10-06 MED ORDER — OXYCODONE HCL 5 MG PO TABS: 5.0000 mg | ORAL_TABLET | Freq: Three times a day (TID) | ORAL | Status: DC | PRN

## 2013-10-06 NOTE — Progress Notes (Signed)
Subjective:    Patient ID: Cody Grant, male    DOB: 1960-09-22, 53 y.o.   MRN: [id number]  HPI: [name] is a 53 year old male who returns for follow up for chronic pain and medication refill. He says his pain is located in his lower back. He rates his pain 5. His current exercise regime is walking and performing stretching exercises. He also uses heat therapy and his TENS Unit for relief of pain.  Pain Inventory Average Pain 6 Pain Right Now 5 My pain is sharp, stabbing and tingling  In the last 24 hours, has pain interfered with the following? General activity 6 Relation with others 5 Enjoyment of life 5 What TIME of day is your pain at its worst? morning, evening and night Sleep (in general) Fair  Pain is worse with: walking, bending, sitting and standing Pain improves with: heat/ice, pacing activities, medication and TENS Relief from Meds: 7  Mobility walk without assistance how many minutes can you walk? 20 ability to climb steps?  yes do you drive?  yes transfers alone  Function disabled: date disabled . I need assistance with the following:  dressing, meal prep, household duties and shopping  Neuro/Psych numbness tingling trouble walking  Prior Studies Any changes since last visit?  no  Physicians involved in your care Any changes since last visit?  no   Family History  Problem Relation Age of Onset  . COPD Mother   . Diabetes Mother   . Pulmonary embolism Father    History   Social History  . Marital Status: Unknown    Spouse Name: N/A    Number of Children: N/A  . Years of Education: N/A   Occupational History  . Disabled    Social History Main Topics  . Smoking status: Current Every Day Smoker -- 1.00 packs/day for 30 years    Types: Cigarettes  . Smokeless tobacco: Never Used  . Alcohol Use: Yes  . Drug Use: No  . Sexual Activity: None   Other Topics Concern  . None   Social History Narrative  . None   Past  Surgical History  Procedure Laterality Date  . Spine surgery  12/2010    micro diskectomy   Past Medical History  Diagnosis Date  . Asthma     as child  . Factor 5 Leiden mutation, heterozygous   . Allergic rhinitis, cause unspecified   . Lumbar disc disease   . Left foot drop   . Chronic lumbar radiculopathy     left with left drop foot/numbness  . Chronic pain     to see Dr Delynn Flavin  . Other and unspecified hyperlipidemia 05/20/2013  . COPD, severe 06/22/2013   BP 111/71  Pulse 77  Resp 14  Ht 5\' 8"  (1.727 m)  Wt 170 lb (77.111 kg)  BMI 25.85 kg/m2  SpO2 96%  Opioid Risk Score:   Fall Risk Score: Low Fall Risk (0-5 points) (patient educated handout declined)   Review of Systems  Musculoskeletal: Positive for back pain and gait problem.  Neurological: Positive for numbness.       Tingling  All other systems reviewed and are negative.      Objective:   Physical Exam  Nursing note and vitals reviewed. Constitutional: He is oriented to person, place, and time. He appears well-developed and well-nourished.  HENT:  Head: Normocephalic and atraumatic.  Neck: Normal range of motion. Neck supple.  Cardiovascular: Normal rate and regular rhythm.  Pulmonary/Chest: Effort normal and breath sounds normal.  Musculoskeletal:  Normal Muscle Bulk and Muscle Testing Reveals:  Upper Extremities: Full ROM and Muscle strength 5/5 Spine Flexion 45 degrees Lumbar Paraspinal tenderness Noted L-3- L-5 Lower Extremities: Right Leg Full ROM and Muscle Strength 5/5. Left Leg: Flexion Produces tingling in foot. Arises from chair with ease. Narrow based Gait  Neurological: He is alert and oriented to person, place, and time.  Skin: Skin is warm and dry.  Psychiatric: He has a normal mood and affect.          Assessment & Plan:  1.Lumbar post laminectomy syndrome Status post L3-L4 laminectomy and discectomy. Has some evidence of L5 radiculopathy as well. Left foraminal disc  protrusion which is known. Refilled: oxyCodone 5mg  one tablet every 8 hours as needed. # 90.  20 minutes of face to face patient care time was spent during this visit. All questions were encouraged and answered.  F/U in 1 month

## 2013-11-05 ENCOUNTER — Encounter: Payer: Self-pay | Admitting: Registered Nurse

## 2013-11-05 ENCOUNTER — Encounter: Payer: Worker's Compensation | Attending: Registered Nurse | Admitting: Registered Nurse

## 2013-11-05 VITALS — BP 109/76 | HR 70 | Resp 14 | Wt 170.0 lb

## 2013-11-05 DIAGNOSIS — J449 Chronic obstructive pulmonary disease, unspecified: Secondary | ICD-10-CM | POA: Diagnosis not present

## 2013-11-05 DIAGNOSIS — M519 Unspecified thoracic, thoracolumbar and lumbosacral intervertebral disc disorder: Secondary | ICD-10-CM

## 2013-11-05 DIAGNOSIS — IMO0002 Reserved for concepts with insufficient information to code with codable children: Secondary | ICD-10-CM

## 2013-11-05 DIAGNOSIS — Z5181 Encounter for therapeutic drug level monitoring: Secondary | ICD-10-CM | POA: Diagnosis not present

## 2013-11-05 DIAGNOSIS — M961 Postlaminectomy syndrome, not elsewhere classified: Secondary | ICD-10-CM | POA: Diagnosis not present

## 2013-11-05 DIAGNOSIS — F17200 Nicotine dependence, unspecified, uncomplicated: Secondary | ICD-10-CM | POA: Insufficient documentation

## 2013-11-05 DIAGNOSIS — J4489 Other specified chronic obstructive pulmonary disease: Secondary | ICD-10-CM | POA: Insufficient documentation

## 2013-11-05 DIAGNOSIS — M5416 Radiculopathy, lumbar region: Secondary | ICD-10-CM

## 2013-11-05 DIAGNOSIS — D6859 Other primary thrombophilia: Secondary | ICD-10-CM | POA: Diagnosis not present

## 2013-11-05 DIAGNOSIS — Z79899 Other long term (current) drug therapy: Secondary | ICD-10-CM

## 2013-11-05 MED ORDER — OXYCODONE HCL 5 MG PO TABS: 5.0000 mg | ORAL_TABLET | Freq: Three times a day (TID) | ORAL | Status: DC | PRN

## 2013-11-05 NOTE — Progress Notes (Signed)
Subjective:    Patient ID: Cody Grant, male    DOB: January 23, 1961, 53 y.o.   MRN: [id number]  HPI: [name] is a 53 year old male who returns for follow up for chronic pain and medication refill. He says his pain is located in his left buttock. When I enterd the room he was pacing, he says he has been having excruciating pain the last few weeks. He was short on his medications he says he has to take two tablets at times. I explained he should have called the office and we could have assessed the situation. He verbalizes understanding. He rates his pain 6. His current exercise regime is walking and performing stretching exercises. He also uses heat therapy and his TENS Unit for relief of pain.       Pain Inventory Average Pain 6 Pain Right Now 6 My pain is did not answer  In the last 24 hours, has pain interfered with the following? General activity 7 Relation with others 7 Enjoyment of life 6 What TIME of day is your pain at its worst? morning and evening Sleep (in general) Fair  Pain is worse with: walking, bending, sitting and standing Pain improves with: heat/ice, therapy/exercise, pacing activities, medication and TENS Relief from Meds: 7  Mobility walk without assistance how many minutes can you walk? 15 ability to climb steps?  yes do you drive?  yes  Function disabled: date disabled 05/01/10 I need assistance with the following:  meal prep, household duties and shopping  Neuro/Psych numbness tingling  Prior Studies Any changes since last visit?  no  Physicians involved in your care Any changes since last visit?  no   Family History  Problem Relation Age of Onset  . COPD Mother   . Diabetes Mother   . Pulmonary embolism Father    History   Social History  . Marital Status: Unknown    Spouse Name: N/A    Number of Children: N/A  . Years of Education: N/A   Occupational History  . Disabled    Social History Main Topics  . Smoking  status: Current Every Day Smoker -- 1.00 packs/day for 30 years    Types: Cigarettes  . Smokeless tobacco: Never Used  . Alcohol Use: Yes  . Drug Use: No  . Sexual Activity: None   Other Topics Concern  . None   Social History Narrative  . None   Past Surgical History  Procedure Laterality Date  . Spine surgery  12/2010    micro diskectomy   Past Medical History  Diagnosis Date  . Asthma     as child  . Factor 5 Leiden mutation, heterozygous   . Allergic rhinitis, cause unspecified   . Lumbar disc disease   . Left foot drop   . Chronic lumbar radiculopathy     left with left drop foot/numbness  . Chronic pain     to see Dr Delynn Flavin  . Other and unspecified hyperlipidemia 05/20/2013  . COPD, severe 06/22/2013   BP 109/76  Pulse 70  Resp 14  Wt 170 lb (77.111 kg)  SpO2 95%  Opioid Risk Score:   Fall Risk Score: Low Fall Risk (0-5 points) (previously educated and declined a handout on fall prevention) Review of Systems  Neurological: Positive for numbness.       Tingling  All other systems reviewed and are negative.      Objective:   Physical Exam  Nursing note and vitals reviewed.  Constitutional: He is oriented to person, place, and time. He appears well-developed and well-nourished.  HENT:  Head: Normocephalic and atraumatic.  Neck: Normal range of motion. Neck supple.  Cardiovascular: Normal rate and regular rhythm.   Pulmonary/Chest: Effort normal and breath sounds normal.  Musculoskeletal:  Normal Muscle Bulk and Muscle Testing Reveals: Upper Extremities: Full ROM and Muscle strength 5/5 Spinal Forward Flexion: 45 Degrees and extension 20 Degrees Thoracic and Lumbar Hypersensitivity Lower extremities: Full ROM and Muscle strength 5/5 Arises from Chair with Ease Narrow based gait   Neurological: He is alert and oriented to person, place, and time.  Skin: Skin is warm and dry.  Psychiatric: He has a normal mood and affect.  Facial Grimacing with  Movement          Assessment & Plan:  1.Lumbar post laminectomy syndrome Status post L3-L4 laminectomy and discectomy. Has some evidence of L5 radiculopathy as well. Left foraminal disc protrusion which is known. Refilled: oxyCodone 5mg  one tablet every 8 hours as needed. # 100. Medication increased during this visit.   20 minutes of face to face patient care time was spent during this visit. All questions were encouraged and answered.   F/U in 1 month

## 2013-11-18 ENCOUNTER — Ambulatory Visit (INDEPENDENT_AMBULATORY_CARE_PROVIDER_SITE_OTHER): Payer: Medicare Other | Admitting: Internal Medicine

## 2013-11-18 ENCOUNTER — Encounter: Payer: Self-pay | Admitting: Internal Medicine

## 2013-11-18 VITALS — BP 114/84 | HR 95 | Temp 98.0°F | Ht 68.0 in | Wt 172.5 lb

## 2013-11-18 DIAGNOSIS — E785 Hyperlipidemia, unspecified: Secondary | ICD-10-CM

## 2013-11-18 DIAGNOSIS — Z0000 Encounter for general adult medical examination without abnormal findings: Secondary | ICD-10-CM

## 2013-11-18 DIAGNOSIS — J45909 Unspecified asthma, uncomplicated: Secondary | ICD-10-CM | POA: Diagnosis not present

## 2013-11-18 DIAGNOSIS — J449 Chronic obstructive pulmonary disease, unspecified: Secondary | ICD-10-CM

## 2013-11-18 DIAGNOSIS — J4520 Mild intermittent asthma, uncomplicated: Secondary | ICD-10-CM

## 2013-11-18 MED ORDER — ALBUTEROL SULFATE HFA 108 (90 BASE) MCG/ACT IN AERS: 2.0000 | INHALATION_SPRAY | Freq: Four times a day (QID) | RESPIRATORY_TRACT | Status: DC | PRN

## 2013-11-18 NOTE — Progress Notes (Signed)
Pre visit review using our clinic review tool, if applicable. No additional management support is needed unless otherwise documented below in the visit note. 

## 2013-11-18 NOTE — Assessment & Plan Note (Signed)
Mild intermittent, for proair prn add,  to f/u any worsening symptoms or concerns

## 2013-11-18 NOTE — Assessment & Plan Note (Signed)
stable overall by history and exam, recent data reviewed with pt, and pt to continue medical treatment as before,  to f/u any worsening symptoms or concerns Last LDL jan 2015 - 130  Pt declines statin for now, to f/u next visit

## 2013-11-18 NOTE — Patient Instructions (Addendum)
Please continue all other medications as before, and refills have been done if requested - the albuterol  Please have the pharmacy call with any other refills you may need.  Please continue your efforts at being more active, low cholesterol diet, and weight control.  Please keep your appointments with your specialists as you may have planned  You will be contacted regarding the referral for: colonoscopy  Please return in 6 months, or sooner if needed

## 2013-11-18 NOTE — Assessment & Plan Note (Addendum)
To add the proair prn, cont spiriva,  to f/u any worsening symptoms or concerns, currently stable,  SpO2 Readings from Last 3 Encounters:  11/18/13 95%  11/05/13 95%  10/06/13 96%

## 2013-11-18 NOTE — Progress Notes (Signed)
Subjective:    Patient ID: Cody Grant, male    DOB: 1961-04-03, 53 y.o.   MRN: [id number]  HPI  Here to f/u; overall doing ok,  Pt denies chest pain, increased sob or doe, wheezing, orthopnea, PND, increased LE swelling, palpitations, dizziness or syncope.  Spiriva seems to help quite a bit.  Asks for rescue inhaler as well.  Pt denies polydipsia, polyuria, or low sugar symptoms such as weakness or confusion improved with po intake.  Pt denies new neurological symptoms such as new headache, or facial or extremity weakness or numbness.   Pt states overall good compliance with meds, has been trying to follow lower cholesterol diet, with wt overall stable,  but little exercise however.  Not taking the lipitor, working on diet.   PFT'ds cw severe copd. Past Medical History  Diagnosis Date  . Asthma     as child  . Factor 5 Leiden mutation, heterozygous   . Allergic rhinitis, cause unspecified   . Lumbar disc disease   . Left foot drop   . Chronic lumbar radiculopathy     left with left drop foot/numbness  . Chronic pain     to see Dr Delynn Flavin  . Other and unspecified hyperlipidemia 05/20/2013  . 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 Spine surgery  12/2010    micro diskectomy    reports that he has been smoking Cigarettes.  He has a 30 pack-year smoking history. He has never used smokeless tobacco. He reports that he drinks alcohol. He reports that he does not use illicit drugs. family history includes COPD in his mother; Diabetes in his mother; Pulmonary embolism in his father. No Known Allergies Current Outpatient Prescriptions on File Prior to Visit  Medication Sig Dispense Refill  . diclofenac (FLECTOR) 1.3 % PTCH Place 1 patch onto the skin 2 (two) times daily.  30 patch  2  . ibuprofen (ADVIL,MOTRIN) 400 MG tablet Take 1 tablet (400 mg total) by mouth every 8 (eight) hours as needed for pain.  90 tablet  1  . methocarbamol (ROBAXIN) 500 MG  tablet Take 1 tablet (500 mg total) by mouth 3 (three) times daily.  60 tablet  1  . oxyCODONE (OXY IR/ROXICODONE) 5 MG immediate release tablet Take 1 tablet (5 mg total) by mouth every 8 (eight) hours as needed. May take an extra tablet when pain is sever  100 tablet  0  . tiotropium (SPIRIVA HANDIHALER) 18 MCG inhalation capsule Place 1 capsule (18 mcg total) into inhaler and inhale daily.  30 capsule  12   No current facility-administered medications on file prior to visit.   Review of Systems  Constitutional: Negative for unusual diaphoresis or other sweats  HENT: Negative for ringing in ear Eyes: Negative for double vision or worsening visual disturbance.  Respiratory: Negative for choking and stridor.   Gastrointestinal: Negative for vomiting or other signifcant bowel change Genitourinary: Negative for hematuria or decreased urine volume.  Musculoskeletal: Negative for other MSK pain or swelling Skin: Negative for color change and worsening wound.  Neurological: Negative for tremors and numbness other than noted  Psychiatric/Behavioral: Negative for decreased concentration or agitation other than above       Objective:   Physical Exam BP 114/84  Pulse 95  Temp(Src) 98 F (36.7 C) (Oral)  Ht 5\' 8"  (1.727 m)  Wt 172 lb 8 oz (78.245 kg)  BMI 26.23 kg/m2  SpO2 95% VS noted,  Constitutional: Pt appears well-developed, well-nourished.  HENT: Head: NCAT.  Right Ear: External ear normal.  Left Ear: External ear normal.  Eyes: . Pupils are equal, round, and reactive to light. Conjunctivae and EOM are normal Neck: Normal range of motion. Neck supple.  Cardiovascular: Normal rate and regular rhythm.   Pulmonary/Chest: Effort normal and breath sounds decreased bilat with few exp wheezes bilat.  Neurological: Pt is alert. Not confused , motor grossly intact Skin: Skin is warm. No rash Psychiatric: Pt behavior is normal. No agitation.     Assessment & Plan:

## 2013-11-19 ENCOUNTER — Encounter: Payer: Self-pay | Admitting: Internal Medicine

## 2013-11-25 ENCOUNTER — Ambulatory Visit (AMBULATORY_SURGERY_CENTER): Payer: Self-pay

## 2013-11-25 VITALS — Ht 68.0 in | Wt 171.0 lb

## 2013-11-25 DIAGNOSIS — Z1211 Encounter for screening for malignant neoplasm of colon: Secondary | ICD-10-CM

## 2013-11-25 MED ORDER — MOVIPREP 100 G PO SOLR: ORAL | Status: DC

## 2013-11-25 NOTE — Progress Notes (Signed)
Per pt, no allergies to soy or egg products.Pt not taking any weight loss meds or using  O2 at home. 

## 2013-12-12 ENCOUNTER — Encounter: Payer: Worker's Compensation | Attending: Registered Nurse | Admitting: Registered Nurse

## 2013-12-12 ENCOUNTER — Encounter: Payer: Self-pay | Admitting: Registered Nurse

## 2013-12-12 VITALS — BP 116/92 | HR 96 | Resp 14 | Wt 168.6 lb

## 2013-12-12 DIAGNOSIS — M961 Postlaminectomy syndrome, not elsewhere classified: Secondary | ICD-10-CM

## 2013-12-12 DIAGNOSIS — G8929 Other chronic pain: Secondary | ICD-10-CM | POA: Insufficient documentation

## 2013-12-12 DIAGNOSIS — Z5181 Encounter for therapeutic drug level monitoring: Secondary | ICD-10-CM | POA: Diagnosis present

## 2013-12-12 DIAGNOSIS — M519 Unspecified thoracic, thoracolumbar and lumbosacral intervertebral disc disorder: Secondary | ICD-10-CM

## 2013-12-12 DIAGNOSIS — IMO0002 Reserved for concepts with insufficient information to code with codable children: Secondary | ICD-10-CM | POA: Insufficient documentation

## 2013-12-12 DIAGNOSIS — M5416 Radiculopathy, lumbar region: Secondary | ICD-10-CM

## 2013-12-12 DIAGNOSIS — Z79899 Other long term (current) drug therapy: Secondary | ICD-10-CM

## 2013-12-12 MED ORDER — OXYCODONE HCL 5 MG PO TABS: 5.0000 mg | ORAL_TABLET | Freq: Three times a day (TID) | ORAL | Status: DC | PRN

## 2013-12-12 NOTE — Progress Notes (Signed)
Subjective:    Patient ID: Cody Grant, male    DOB: 03-Jan-1961, 53 y.o.   MRN: [id number]  HPI: [name] is a 53 year old male who returns for follow up for chronic pain and medication refill. He says his pain is located in his left hip. He says he feels achy all over his body. Last week they were in [place] for vacation. On Sunday 12/07/2013 he woke up with cold sweats and shivering. His wife had the same thing. He has a productive cough. He has COPD and has been using his inhaler he will follow up with his PCP if he doesn't feel better in a few days he says. He rates his pain 5. His usual current exercise regime is walking and performing stretching exercises. He hasn't exercise for the last week due to not feeling well. He says he will resume soon as he starts  feeling better.    Pain Inventory Average Pain 6 Pain Right Now 5 My pain is sharp, stabbing and tingling  In the last 24 hours, has pain interfered with the following? General activity 6 Relation with others 6 Enjoyment of life 6 What TIME of day is your pain at its worst? morning evening and night Sleep (in general) Fair  Pain is worse with: walking, bending, sitting and standing Pain improves with: rest, heat/ice, pacing activities, medication and TENS Relief from Meds: 7  Mobility how many minutes can you walk? 15 ability to climb steps?  yes do you drive?  yes  Function disabled: date disabled 05/11/10 I need assistance with the following:  dressing, meal prep, household duties and shopping  Neuro/Psych numbness tingling  Prior Studies Any changes since last visit?  no  Physicians involved in your care Any changes since last visit?  no   Family History  Problem Relation Age of Onset  . COPD Mother   . Diabetes Mother   . Pulmonary embolism Father    History   Social History  . Marital Status: Unknown    Spouse Name: N/A    Number of Children: N/A  . Years of Education: N/A    Occupational History  . Disabled    Social History Main Topics  . Smoking status: Current Every Day Smoker -- 1.00 packs/day for 30 years    Types: Cigarettes  . Smokeless tobacco: Never Used  . Alcohol Use: 1.2 - 2.4 oz/week    1-2 Cans of beer, 1-2 Glasses of wine per week  . Drug Use: No  . Sexual Activity: None   Other Topics Concern  . None   Social History Narrative  . None   Past Surgical History  Procedure Laterality Date  . Spine surgery  12/2010    micro diskectomy   Past Medical History  Diagnosis Date  . Asthma     as child  . Factor 5 Leiden mutation, heterozygous   . Allergic rhinitis, cause unspecified   . Lumbar disc disease   . Left foot drop   . Chronic lumbar radiculopathy     left with left drop foot/numbness  . Chronic pain     to see Dr Delynn Flavin  . Other and unspecified hyperlipidemia 05/20/2013  . COPD, severe 06/22/2013   BP 116/92  Pulse 96  Resp 14  Wt 168 lb 9.6 oz (76.476 kg)  SpO2 97%  Opioid Risk Score:   Fall Risk Score: Low Fall Risk (0-5 points) (previously educated and given handout)  Review of Systems  Constitutional: Positive for fever and chills.  Respiratory: Positive for apnea, cough and shortness of breath.   Gastrointestinal: Positive for diarrhea.  All other systems reviewed and are negative.      Objective:   Physical Exam  Nursing note and vitals reviewed. Constitutional: He is oriented to person, place, and time. He appears well-developed and well-nourished.  HENT:  Head: Normocephalic and atraumatic.  Neck: Normal range of motion. Neck supple.  Cardiovascular: Normal rate and regular rhythm.   Pulmonary/Chest: Effort normal and breath sounds normal.  Productive Cough  Musculoskeletal:  Normal Muscle Bulk and Muscle testing Reveals: Upper Extremities: Full ROM and Muscle Strength 5/5 Left Greater Trochanteric Tenderness Lower Extremities: Full ROM and Muscle strength 5/5 Arises from chair with  ease Narrow Based Gait  Neurological: He is alert and oriented to person, place, and time.  Skin: Skin is warm and dry.  Psychiatric: He has a normal mood and affect.          Assessment & Plan:  1.Lumbar post laminectomy syndrome Status post L3-L4 laminectomy and discectomy. Has some evidence of L5 radiculopathy as well. Left foraminal disc protrusion which is known. Refilled: oxyCodone 5mg  one tablet every 8 hours as needed. # 100.  15 minutes of face to face patient care time was spent during this visit. All questions were encouraged and answered.   F/U in 1 month

## 2013-12-15 ENCOUNTER — Telehealth: Payer: Self-pay | Admitting: Internal Medicine

## 2013-12-15 NOTE — Telephone Encounter (Signed)
Spoke with patient. He states "sick since back from [place], whole family passing it around", patient states he is having low grade fever, coughing and taking Nyquil.  Patient request to reschedule direct screening colonoscopy. Made new appointment for 01/20/14 at 3:30 pm @[facility]  w/ Dr.Perry. Patient also request insurance coverage information, left message on Janelle's phone for her to give him this information. New instructions mailed to patient's house. Patient was notified of this.

## 2013-12-17 ENCOUNTER — Encounter: Payer: Self-pay | Admitting: Internal Medicine

## 2014-01-08 ENCOUNTER — Encounter: Payer: Worker's Compensation | Admitting: Registered Nurse

## 2014-01-13 ENCOUNTER — Encounter: Payer: Self-pay | Admitting: Registered Nurse

## 2014-01-13 ENCOUNTER — Encounter: Payer: Worker's Compensation | Attending: Registered Nurse | Admitting: Registered Nurse

## 2014-01-13 VITALS — BP 125/70 | HR 74 | Resp 14 | Wt 168.8 lb

## 2014-01-13 DIAGNOSIS — J4489 Other specified chronic obstructive pulmonary disease: Secondary | ICD-10-CM | POA: Insufficient documentation

## 2014-01-13 DIAGNOSIS — J449 Chronic obstructive pulmonary disease, unspecified: Secondary | ICD-10-CM | POA: Diagnosis not present

## 2014-01-13 DIAGNOSIS — M5416 Radiculopathy, lumbar region: Secondary | ICD-10-CM

## 2014-01-13 DIAGNOSIS — D6859 Other primary thrombophilia: Secondary | ICD-10-CM | POA: Insufficient documentation

## 2014-01-13 DIAGNOSIS — M961 Postlaminectomy syndrome, not elsewhere classified: Secondary | ICD-10-CM | POA: Insufficient documentation

## 2014-01-13 DIAGNOSIS — Z79899 Other long term (current) drug therapy: Secondary | ICD-10-CM

## 2014-01-13 DIAGNOSIS — Z5181 Encounter for therapeutic drug level monitoring: Secondary | ICD-10-CM

## 2014-01-13 DIAGNOSIS — F17200 Nicotine dependence, unspecified, uncomplicated: Secondary | ICD-10-CM | POA: Insufficient documentation

## 2014-01-13 DIAGNOSIS — IMO0002 Reserved for concepts with insufficient information to code with codable children: Secondary | ICD-10-CM

## 2014-01-13 MED ORDER — DICLOFENAC EPOLAMINE 1.3 % TD PTCH: 1.0000 | MEDICATED_PATCH | TRANSDERMAL | Status: DC | PRN

## 2014-01-13 MED ORDER — OXYCODONE HCL 5 MG PO TABS: 5.0000 mg | ORAL_TABLET | Freq: Three times a day (TID) | ORAL | Status: DC | PRN

## 2014-01-13 NOTE — Progress Notes (Signed)
Subjective:    Patient ID: Cody Grant, male    DOB: 01-21-1961, 53 y.o.   MRN: [id number]  HPI: [name] is a 53 year old male who returns for follow up for chronic pain and medication refill. He says his pain is located in his lower back mainly left side He rates his pain 7. His current exercise regime is walking.  Pain Inventory Average Pain 6 Pain Right Now 7 My pain is sharp, stabbing and tingling  In the last 24 hours, has pain interfered with the following? General activity 7 Relation with others 6 Enjoyment of life 6 What TIME of day is your pain at its worst? evening and night Sleep (in general) Fair  Pain is worse with: walking, bending, sitting and standing Pain improves with: heat/ice, pacing activities, medication and TENS Relief from Meds: 8  Mobility walk without assistance how many minutes can you walk? 15 ability to climb steps?  yes do you drive?  yes  Function disabled: date disabled 04/18/2010 I need assistance with the following:  meal prep, household duties and shopping  Neuro/Psych numbness tingling trouble walking  Prior Studies Any changes since last visit?  no  Physicians involved in your care Any changes since last visit?  no   Family History  Problem Relation Age of Onset  . COPD Mother   . Diabetes Mother   . Pulmonary embolism Father    History   Social History  . Marital Status: Unknown    Spouse Name: N/A    Number of Children: N/A  . Years of Education: N/A   Occupational History  . Disabled    Social History Main Topics  . Smoking status: Current Every Day Smoker -- 1.00 packs/day for 30 years    Types: Cigarettes  . Smokeless tobacco: Never Used  . Alcohol Use: 1.2 - 2.4 oz/week    1-2 Cans of beer, 1-2 Glasses of wine per week  . Drug Use: No  . Sexual Activity: None   Other Topics Concern  . None   Social History Narrative  . None   Past Surgical History  Procedure Laterality Date  .  Spine surgery  12/2010    micro diskectomy   Past Medical History  Diagnosis Date  . Asthma     as child  . Factor 5 Leiden mutation, heterozygous   . Allergic rhinitis, cause unspecified   . Lumbar disc disease   . Left foot drop   . Chronic lumbar radiculopathy     left with left drop foot/numbness  . Chronic pain     to see Dr Delynn Flavin  . Other and unspecified hyperlipidemia 05/20/2013  . COPD, severe 06/22/2013   BP 125/70  Pulse 74  Resp 14  Wt 168 lb 12.8 oz (76.567 kg)  SpO2 97%  Opioid Risk Score:   Fall Risk Score:  (previoulsy educated and given handout)  Review of Systems  Musculoskeletal: Positive for gait problem.  Neurological: Positive for numbness.       Tingling  All other systems reviewed and are negative.      Objective:   Physical Exam  Nursing note and vitals reviewed. Constitutional: He is oriented to person, place, and time. He appears well-developed and well-nourished.  HENT:  Head: Normocephalic and atraumatic.  Neck: Normal range of motion. Neck supple.  Cardiovascular: Normal rate and regular rhythm.   Pulmonary/Chest: Effort normal. He has wheezes.  Musculoskeletal:  Normal Muscle Bulk and Muscle Testing Reveals:  Upper Extremities: Full ROM and Muscle Strength 5/5 Lumbar Paraspinal Tenderness: L-3- L-5 Lower Extremities: Full ROM and Muscle Strength 5/5 Arises from chair with ease Narrow Based Gait  Neurological: He is alert and oriented to person, place, and time.  Skin: Skin is warm and dry.  Psychiatric: He has a normal mood and affect.          Assessment & Plan:  1.Lumbar post laminectomy syndrome Status post L3-L4 laminectomy and discectomy. Has some evidence of L5 radiculopathy as well. Left foraminal disc protrusion which is known. Refilled: oxyCodone 5mg  one tablet every 8 hours as needed. # 100.  15 minutes of face to face patient care time was spent during this visit. All questions were encouraged and answered.   F/U  in 1 month

## 2014-01-20 ENCOUNTER — Encounter: Payer: Self-pay | Admitting: Internal Medicine

## 2014-02-10 ENCOUNTER — Encounter: Payer: Worker's Compensation | Attending: Registered Nurse | Admitting: Registered Nurse

## 2014-02-10 ENCOUNTER — Encounter: Payer: Self-pay | Admitting: Registered Nurse

## 2014-02-10 VITALS — BP 122/68 | HR 88 | Resp 14 | Ht 68.0 in | Wt 169.0 lb

## 2014-02-10 DIAGNOSIS — M961 Postlaminectomy syndrome, not elsewhere classified: Secondary | ICD-10-CM

## 2014-02-10 DIAGNOSIS — M25552 Pain in left hip: Secondary | ICD-10-CM | POA: Diagnosis not present

## 2014-02-10 DIAGNOSIS — M5416 Radiculopathy, lumbar region: Secondary | ICD-10-CM

## 2014-02-10 DIAGNOSIS — Y838 Other surgical procedures as the cause of abnormal reaction of the patient, or of later complication, without mention of misadventure at the time of the procedure: Secondary | ICD-10-CM | POA: Diagnosis not present

## 2014-02-10 DIAGNOSIS — M7062 Trochanteric bursitis, left hip: Secondary | ICD-10-CM

## 2014-02-10 DIAGNOSIS — Z79899 Other long term (current) drug therapy: Secondary | ICD-10-CM

## 2014-02-10 DIAGNOSIS — G8929 Other chronic pain: Secondary | ICD-10-CM | POA: Insufficient documentation

## 2014-02-10 DIAGNOSIS — Z5181 Encounter for therapeutic drug level monitoring: Secondary | ICD-10-CM

## 2014-02-10 MED ORDER — METHYLPREDNISOLONE 4 MG PO KIT: PACK | ORAL | Status: DC

## 2014-02-10 MED ORDER — OXYCODONE HCL 5 MG PO TABS: 5.0000 mg | ORAL_TABLET | Freq: Three times a day (TID) | ORAL | Status: DC | PRN

## 2014-02-10 NOTE — Progress Notes (Signed)
Subjective:    Patient ID: Cody Grant, male    DOB: [birth year], 53 y.o.   MRN: [id number]  HPI: [name] is a 53 year old male who returns for follow up for chronic pain and medication refill. He says his pain is located in his left hip and left buttock. He rates his pain 5. His current exercise regime is  Performing stretching exercises and walking.   Pain Inventory Average Pain 6 Pain Right Now 5 My pain is constant, sharp, stabbing and tingling  In the last 24 hours, has pain interfered with the following? General activity 6 Relation with others 5 Enjoyment of life 5 What TIME of day is your pain at its worst? morning and  night Sleep (in general) Fair  Pain is worse with: walking, bending, sitting and standing Pain improves with: heat/ice, pacing activities, medication and TENS Relief from Meds: 7  Mobility walk without assistance  Function disabled: date disabled 05/19/2010  Neuro/Psych numbness tingling  Prior Studies Any changes since last visit?  no  Physicians involved in your care Any changes since last visit?  no   Family History  Problem Relation Age of Onset  . COPD Mother   . Diabetes Mother   . Pulmonary embolism Father    History   Social History  . Marital Status: Unknown    Spouse Name: N/A    Number of Children: N/A  . Years of Education: N/A   Occupational History  . Disabled    Social History Main Topics  . Smoking status: Current Every Day Smoker -- 1.00 packs/day for 30 years    Types: Cigarettes  . Smokeless tobacco: Never Used  . Alcohol Use: 1.2 - 2.4 oz/week    1-2 Cans of beer, 1-2 Glasses of wine per week  . Drug Use: No  . Sexual Activity: None   Other Topics Concern  . None   Social History Narrative  . None   Past Surgical History  Procedure Laterality Date  . Spine surgery  12/2010    micro diskectomy   Past Medical History  Diagnosis Date  . Asthma     as child  . Factor 5 Leiden  mutation, heterozygous   . Allergic rhinitis, cause unspecified   . Lumbar disc disease   . Left foot drop   . Chronic lumbar radiculopathy     left with left drop foot/numbness  . Chronic pain     to see Dr Delynn Flavin  . Other and unspecified hyperlipidemia 05/20/2013  . COPD, severe 06/22/2013   BP 122/68  Pulse 88  Resp 14  Ht 5\' 8"  (1.727 m)  Wt 169 lb (76.658 kg)  BMI 25.70 kg/m2  SpO2 96%  Opioid Risk Score:   Fall Risk Score: Low Fall Risk (0-5 points)  Review of Systems     Objective:   Physical Exam  Nursing note and vitals reviewed. Constitutional: He is oriented to person, place, and time. He appears well-developed and well-nourished.  HENT:  Head: Normocephalic and atraumatic.  Neck: Normal range of motion. Neck supple.  Cardiovascular: Normal rate and regular rhythm.   Pulmonary/Chest: Effort normal and breath sounds normal.  Musculoskeletal:  Normal Muscle Bulk and Muscle testing Reveals: Upper Extremities: Full ROM and Muscle Strength 5/5 Left Greater Trochanteric Tenderness Left Gluteal Maximus Tenderness Lower Extremities: Full ROM and Muscle Strength 5/5 Arises from chair with ease Narrow based gait  Neurological: He is alert and oriented to person, place, and time.  Skin: Skin is warm and dry.  Psychiatric: He has a normal mood and affect.          Assessment & Plan:  1.Lumbar post laminectomy syndrome Status post L3-L4 laminectomy and discectomy. Has some evidence of L5 radiculopathy as well. Left foraminal disc protrusion which is known. Refilled: oxyCodone 5mg  one tablet every 8 hours as needed. # 100.May take an extra tablet when the pain is sever. 2. Left Greater Trochanteric Bursitis: RX: Medrol Dose Pak/ Refuses the steroid injection/  15 minutes of face to face patient care time was spent during this visit. All questions were encouraged and answered.   F/U in 1 month

## 2014-03-13 ENCOUNTER — Ambulatory Visit: Payer: Self-pay | Admitting: Physical Medicine & Rehabilitation

## 2014-03-13 ENCOUNTER — Encounter: Payer: Self-pay | Admitting: Registered Nurse

## 2014-03-13 ENCOUNTER — Encounter: Payer: Worker's Compensation | Attending: Physical Medicine & Rehabilitation | Admitting: Registered Nurse

## 2014-03-13 VITALS — BP 115/71 | HR 80 | Resp 14 | Ht 68.0 in | Wt 171.0 lb

## 2014-03-13 DIAGNOSIS — M545 Low back pain: Secondary | ICD-10-CM | POA: Diagnosis not present

## 2014-03-13 DIAGNOSIS — M5416 Radiculopathy, lumbar region: Secondary | ICD-10-CM | POA: Diagnosis not present

## 2014-03-13 DIAGNOSIS — Z79899 Other long term (current) drug therapy: Secondary | ICD-10-CM | POA: Diagnosis not present

## 2014-03-13 DIAGNOSIS — G8929 Other chronic pain: Secondary | ICD-10-CM | POA: Diagnosis not present

## 2014-03-13 DIAGNOSIS — Y838 Other surgical procedures as the cause of abnormal reaction of the patient, or of later complication, without mention of misadventure at the time of the procedure: Secondary | ICD-10-CM | POA: Insufficient documentation

## 2014-03-13 DIAGNOSIS — M961 Postlaminectomy syndrome, not elsewhere classified: Secondary | ICD-10-CM | POA: Diagnosis not present

## 2014-03-13 DIAGNOSIS — Z5181 Encounter for therapeutic drug level monitoring: Secondary | ICD-10-CM | POA: Diagnosis not present

## 2014-03-13 DIAGNOSIS — Z760 Encounter for issue of repeat prescription: Secondary | ICD-10-CM | POA: Diagnosis not present

## 2014-03-13 DIAGNOSIS — M5126 Other intervertebral disc displacement, lumbar region: Secondary | ICD-10-CM | POA: Diagnosis not present

## 2014-03-13 MED ORDER — OXYCODONE HCL 5 MG PO TABS: 5.0000 mg | ORAL_TABLET | Freq: Three times a day (TID) | ORAL | Status: DC | PRN

## 2014-03-13 NOTE — Progress Notes (Signed)
Subjective:    Patient ID: Cody Grant, male    DOB: Jan 01, 1961, 53 y.o.   MRN: [id number]  HPI: [name] is a 53 year old male who returns for follow up for chronic pain and medication refill. He says his pain is located in his lower back. He rates his pain 4. His current exercise regime is Performing stretching exercises and walking. He walks his dog three times a day.  Pain Inventory Average Pain 7 Pain Right Now 4 My pain is sharp, stabbing and tingling  In the last 24 hours, has pain interfered with the following? General activity 6 Relation with others 5 Enjoyment of life 5 What TIME of day is your pain at its worst? evening, night Sleep (in general) Fair  Pain is worse with: bending, sitting and standing Pain improves with: heat/ice, pacing activities, medication and TENS Relief from Meds: 7  Mobility walk without assistance how many minutes can you walk? 15 ability to climb steps?  yes do you drive?  yes transfers alone  Function not employed: date last employed 11/16/09 disabled: date disabled 05/19/10 I need assistance with the following:  dressing, meal prep, household duties and shopping  Neuro/Psych numbness tingling  Prior Studies Any changes since last visit?  no x-rays CT/MRI nerve study  Physicians involved in your care Any changes since last visit?  no   Family History  Problem Relation Age of Onset  . COPD Mother   . Diabetes Mother   . Pulmonary embolism Father    History   Social History  . Marital Status: Unknown    Spouse Name: N/A    Number of Children: N/A  . Years of Education: N/A   Occupational History  . Disabled    Social History Main Topics  . Smoking status: Current Every Day Smoker -- 1.00 packs/day for 30 years    Types: Cigarettes  . Smokeless tobacco: Never Used  . Alcohol Use: 1.2 - 2.4 oz/week    1-2 Cans of beer, 1-2 Glasses of wine per week  . Drug Use: No  . Sexual Activity: None    Other Topics Concern  . None   Social History Narrative   Past Surgical History  Procedure Laterality Date  . Spine surgery  12/2010    micro diskectomy   Past Medical History  Diagnosis Date  . Asthma     as child  . Factor 5 Leiden mutation, heterozygous   . Allergic rhinitis, cause unspecified   . Lumbar disc disease   . Left foot drop   . Chronic lumbar radiculopathy     left with left drop foot/numbness  . Chronic pain     to see Dr Delynn Flavin  . Other and unspecified hyperlipidemia 05/20/2013  . COPD, severe 06/22/2013   BP 115/71 mmHg  Pulse 80  Resp 14  Ht 5\' 8"  (1.727 m)  Wt 171 lb (77.565 kg)  BMI 26.01 kg/m2  SpO2 95%  Opioid Risk Score:   Fall Risk Score:    Review of Systems  Musculoskeletal: Positive for back pain.  Neurological: Positive for numbness.       Tingling  All other systems reviewed and are negative.      Objective:   Physical Exam  Constitutional: He is oriented to person, place, and time. He appears well-developed and well-nourished.  HENT:  Head: Normocephalic and atraumatic.  Neck: Normal range of motion. Neck supple.  Cardiovascular: Normal rate and regular rhythm.   Pulmonary/Chest:  Effort normal and breath sounds normal.  Musculoskeletal:  Normal Muscle Bulk and Muscle Testing Reveals: Upper Extremities: Full ROM and Muscle Strength 5/5 Lumbar Paraspinal Tenderness: L-3- L-5 Lower Extremities: Full ROM and Muscle Strength 5/5 Arises from chair with ease Narrow Based Gait  Neurological: He is alert and oriented to person, place, and time.  Skin: Skin is warm and dry.  Psychiatric: He has a normal mood and affect.  Nursing note and vitals reviewed.         Assessment & Plan:  1.Lumbar post laminectomy syndrome Status post L3-L4 laminectomy and discectomy. Has some evidence of L5 radiculopathy as well. Left foraminal disc protrusion which is known. Refilled: oxyCodone 5mg  one tablet every 8 hours as needed. # 100.May  take an extra tablet when the pain is sever.  15 minutes of face to face patient care time was spent during this visit. All questions were encouraged and answered.   F/U in 1 month

## 2014-04-10 ENCOUNTER — Encounter: Payer: Self-pay | Admitting: Registered Nurse

## 2014-04-10 ENCOUNTER — Encounter: Payer: Worker's Compensation | Attending: Registered Nurse | Admitting: Registered Nurse

## 2014-04-10 VITALS — BP 115/63 | HR 84 | Resp 14 | Wt 167.0 lb

## 2014-04-10 DIAGNOSIS — M79672 Pain in left foot: Secondary | ICD-10-CM | POA: Insufficient documentation

## 2014-04-10 DIAGNOSIS — M961 Postlaminectomy syndrome, not elsewhere classified: Secondary | ICD-10-CM | POA: Insufficient documentation

## 2014-04-10 DIAGNOSIS — Z5181 Encounter for therapeutic drug level monitoring: Secondary | ICD-10-CM | POA: Diagnosis not present

## 2014-04-10 DIAGNOSIS — M5416 Radiculopathy, lumbar region: Secondary | ICD-10-CM | POA: Insufficient documentation

## 2014-04-10 DIAGNOSIS — G8929 Other chronic pain: Secondary | ICD-10-CM | POA: Insufficient documentation

## 2014-04-10 DIAGNOSIS — Z79899 Other long term (current) drug therapy: Secondary | ICD-10-CM | POA: Diagnosis not present

## 2014-04-10 DIAGNOSIS — Z760 Encounter for issue of repeat prescription: Secondary | ICD-10-CM | POA: Insufficient documentation

## 2014-04-10 DIAGNOSIS — Z79891 Long term (current) use of opiate analgesic: Secondary | ICD-10-CM | POA: Diagnosis not present

## 2014-04-10 MED ORDER — OXYCODONE HCL 5 MG PO TABS: 5.0000 mg | ORAL_TABLET | Freq: Three times a day (TID) | ORAL | Status: DC | PRN

## 2014-04-10 NOTE — Progress Notes (Signed)
Subjective:    Patient ID: Cody Grant, male    DOB: 26-Apr-1961, 53 y.o.   MRN: [id number]  HPI: Cody Grant is a 53 year old male who returns for follow up for chronic pain and medication refill. He says his pain is located in his left buttock and left foot. He rates his pain 5. His current exercise regime is performing stretching exercises and walking. He walks his dog three times a day. He;s trying to quit smoking at 1/2 pk a day, encouraged to continue.   Pain Inventory Average Pain 7 Pain Right Now 5 My pain is sharp, stabbing and tingling  In the last 24 hours, has pain interfered with the following? General activity 7 Relation with others 6 Enjoyment of life 6 What TIME of day is your pain at its worst? morning evening and night Sleep (in general) NA  Pain is worse with: bending, sitting and standing Pain improves with: heat/ice, pacing activities, medication and TENS Relief from Meds: 7  Mobility walk without assistance how many minutes can you walk? 15 ability to climb steps?  yes do you drive?  yes  Function disabled: date disabled 2012 I need assistance with the following:  dressing, meal prep, household duties and shopping  Neuro/Psych numbness tingling  Prior Studies Any changes since last visit?  no  Physicians involved in your care Any changes since last visit?  no   Family History  Problem Relation Age of Onset  . COPD Mother   . Diabetes Mother   . Pulmonary embolism Father    History   Social History  . Marital Status: Unknown    Spouse Name: N/A    Number of Children: N/A  . Years of Education: N/A   Occupational History  . Disabled    Social History Main Topics  . Smoking status: Current Every Day Smoker -- 1.00 packs/day for 30 years    Types: Cigarettes  . Smokeless tobacco: Never Used  . Alcohol Use: 1.2 - 2.4 oz/week    1-2 Cans of beer, 1-2 Glasses of wine per week  . Drug Use: No  . Sexual Activity: None    Other Topics Concern  . None   Social History Narrative   Past Surgical History  Procedure Laterality Date  . Spine surgery  12/2010    micro diskectomy   Past Medical History  Diagnosis Date  . Asthma     as child  . Factor 5 Leiden mutation, heterozygous   . Allergic rhinitis, cause unspecified   . Lumbar disc disease   . Left foot drop   . Chronic lumbar radiculopathy     left with left drop foot/numbness  . Chronic pain     to see Dr Delynn Flavin  . Other and unspecified hyperlipidemia 05/20/2013  . COPD, severe 06/22/2013   BP 115/63 mmHg  Pulse 84  Resp 14  Wt 167 lb (75.751 kg)  SpO2 95%  Opioid Risk Score:   Fall Risk Score: Low Fall Risk (0-5 points) (previously educated and given handout) Review of Systems  Neurological: Positive for numbness.       Tingling  All other systems reviewed and are negative.      Objective:   Physical Exam  Constitutional: He is oriented to person, place, and time. He appears well-developed and well-nourished.  HENT:  Head: Normocephalic and atraumatic.  Neck: Normal range of motion. Neck supple.  Cardiovascular: Normal rate and regular rhythm.   Pulmonary/Chest: Effort normal  and breath sounds normal.  Musculoskeletal:  Normal Muscle Bulk and Muscle testing Reveals: Upper Extremities: Full ROM and Muscle Strength 5/5 Spinal Forward Flexion 45 Degrees and Extension 20 Degrees Lumbar Paraspinal Tenderness: L-4- L-5 Sacral Tenderness: S1 Lower Extremities: Full ROM and Muscle strength 5/5 Arises from chair with ease Narrow Based gait   Neurological: He is alert and oriented to person, place, and time.  Skin: Skin is warm and dry.  Psychiatric: He has a normal mood and affect.  Nursing note and vitals reviewed.         Assessment & Plan:  1.Lumbar post laminectomy syndrome Status post L3-L4 laminectomy and discectomy. Has some evidence of L5 radiculopathy as well. Left foraminal disc protrusion which is known.  Refilled: oxyCodone 5mg  one tablet every 8 hours as needed. # 100.May take an extra tablet when the pain is sever.  15 minutes of face to face patient care time was spent during this visit. All questions were encouraged and answered.   F/U in 1 month

## 2014-05-07 ENCOUNTER — Encounter: Payer: Worker's Compensation | Attending: Registered Nurse | Admitting: Registered Nurse

## 2014-05-07 ENCOUNTER — Encounter: Payer: Self-pay | Admitting: Registered Nurse

## 2014-05-07 ENCOUNTER — Other Ambulatory Visit: Payer: Self-pay | Admitting: Physical Medicine & Rehabilitation

## 2014-05-07 VITALS — BP 119/68 | HR 84 | Resp 14

## 2014-05-07 DIAGNOSIS — M5126 Other intervertebral disc displacement, lumbar region: Secondary | ICD-10-CM | POA: Diagnosis not present

## 2014-05-07 DIAGNOSIS — Z760 Encounter for issue of repeat prescription: Secondary | ICD-10-CM | POA: Insufficient documentation

## 2014-05-07 DIAGNOSIS — M961 Postlaminectomy syndrome, not elsewhere classified: Secondary | ICD-10-CM | POA: Diagnosis not present

## 2014-05-07 DIAGNOSIS — Z79899 Other long term (current) drug therapy: Secondary | ICD-10-CM

## 2014-05-07 DIAGNOSIS — G8929 Other chronic pain: Secondary | ICD-10-CM | POA: Insufficient documentation

## 2014-05-07 DIAGNOSIS — Y838 Other surgical procedures as the cause of abnormal reaction of the patient, or of later complication, without mention of misadventure at the time of the procedure: Secondary | ICD-10-CM | POA: Diagnosis not present

## 2014-05-07 DIAGNOSIS — Z5181 Encounter for therapeutic drug level monitoring: Secondary | ICD-10-CM

## 2014-05-07 DIAGNOSIS — M545 Low back pain: Secondary | ICD-10-CM | POA: Insufficient documentation

## 2014-05-07 DIAGNOSIS — G894 Chronic pain syndrome: Secondary | ICD-10-CM

## 2014-05-07 DIAGNOSIS — M5416 Radiculopathy, lumbar region: Secondary | ICD-10-CM

## 2014-05-07 MED ORDER — OXYCODONE HCL 5 MG PO TABS: 5.0000 mg | ORAL_TABLET | Freq: Three times a day (TID) | ORAL | Status: DC | PRN

## 2014-05-07 NOTE — Progress Notes (Signed)
Subjective:    Patient ID: Cody Grant, male    DOB: [birth year], 54 y.o.   MRN: [id number]  HPI: [name] is a 54 year old male who returns for follow up for chronic pain and medication refill. He says his pain is located in his lower back. He rates his pain 6. His current exercise regime iswalking. He walks his dog three times a day.  Pain Inventory Average Pain 7 Pain Right Now 6 My pain is sharp, stabbing and tingling  In the last 24 hours, has pain interfered with the following? General activity 6 Relation with others 5 Enjoyment of life 5 What TIME of day is your pain at its worst? morning evening and night Sleep (in general) Fair  Pain is worse with: bending, sitting and standing Pain improves with: heat/ice, pacing activities, medication and TENS Relief from Meds: 7  Mobility walk without assistance how many minutes can you walk? 15 ability to climb steps?  yes do you drive?  yes  Function disabled: date disabled 2011 I need assistance with the following:  dressing, meal prep, household duties and shopping  Neuro/Psych numbness tingling  Prior Studies Any changes since last visit?  no  Physicians involved in your care Any changes since last visit?  no   Family History  Problem Relation Age of Onset  . COPD Mother   . Diabetes Mother   . Pulmonary embolism Father    History   Social History  . Marital Status: Unknown    Spouse Name: N/A    Number of Children: N/A  . Years of Education: N/A   Occupational History  . Disabled    Social History Main Topics  . Smoking status: Current Every Day Smoker -- 1.00 packs/day for 30 years    Types: Cigarettes  . Smokeless tobacco: Never Used  . Alcohol Use: 1.2 - 2.4 oz/week    1-2 Cans of beer, 1-2 Glasses of wine per week  . Drug Use: No  . Sexual Activity: None   Other Topics Concern  . None   Social History Narrative   Past Surgical History  Procedure Laterality Date  . Spine  surgery  12/2010    micro diskectomy   Past Medical History  Diagnosis Date  . Asthma     as child  . Factor 5 Leiden mutation, heterozygous   . Allergic rhinitis, cause unspecified   . Lumbar disc disease   . Left foot drop   . Chronic lumbar radiculopathy     left with left drop foot/numbness  . Chronic pain     to see Dr Delynn Flavin  . Other and unspecified hyperlipidemia 05/20/2013  . COPD, severe 06/22/2013   BP 119/68 mmHg  Pulse 84  Resp 14  SpO2 97%  Opioid Risk Score:   Fall Risk Score:  (previously educated and given handout) Review of Systems  Respiratory: Positive for cough and wheezing.   Musculoskeletal: Positive for back pain.  Neurological: Positive for numbness.       Tingling  All other systems reviewed and are negative.      Objective:   Physical Exam  Constitutional: He is oriented to person, place, and time. He appears well-developed and well-nourished.  HENT:  Head: Normocephalic and atraumatic.  Neck: Normal range of motion. Neck supple.  Cardiovascular: Normal rate and regular rhythm.   Pulmonary/Chest: Effort normal and breath sounds normal.  Musculoskeletal:  Normal Muscle Bulk and Muscle testing Reveals: Upper Extremities: Full ROM  and Muscle strength 5/5 Spinal Forward Flexion 30 Degrees and extension 20 Degrees Lumbar Paraspinal Tenderness: L-3- L-5 ( Mainly Left Side) Lower Extremities: Full ROM and Muscle Strength 5/5 Arises from chair with ease Narrow Based gait  Neurological: He is alert and oriented to person, place, and time.  Skin: Skin is warm and dry.  Psychiatric: He has a normal mood and affect.  Nursing note and vitals reviewed.         Assessment & Plan:  1.Lumbar post laminectomy syndrome Status post L3-L4 laminectomy and discectomy. Has some evidence of L5 radiculopathy as well. Left foraminal disc protrusion which is known. Refilled: oxyCodone 5mg  one tablet every 8 hours as needed. # 100.May take an extra tablet  when the pain is sever.  15 minutes of face to face patient care time was spent during this visit. All questions were encouraged and answered.   F/U in 1 month

## 2014-05-08 LAB — PMP ALCOHOL METABOLITE (ETG): Ethyl Glucuronide (EtG): NEGATIVE ng/mL

## 2014-05-10 LAB — CANNABANOIDS (GC/LC/MS), URINE: THC-COOH UR CONFIRM: 58 ng/mL — AB (ref <5)

## 2014-05-12 LAB — PRESCRIPTION MONITORING PROFILE (SOLSTAS)
Amphetamine/Meth: NEGATIVE ng/mL
Barbiturate Screen, Urine: NEGATIVE ng/mL
Benzodiazepine Screen, Urine: NEGATIVE ng/mL
Buprenorphine, Urine: NEGATIVE ng/mL
COCAINE METABOLITES: NEGATIVE ng/mL
CREATININE, URINE: 80.61 mg/dL (ref >20.0)
Carisoprodol, Urine: NEGATIVE ng/mL
ECSTASY: NEGATIVE ng/mL
Fentanyl, Ur: NEGATIVE ng/mL
MEPERIDINE UR: NEGATIVE ng/mL
Methadone Screen, Urine: NEGATIVE ng/mL
Nitrites, Initial: NEGATIVE ug/mL
OPIATE SCREEN, URINE: NEGATIVE ng/mL
OXYCODONE SCRN UR: NEGATIVE ng/mL
Propoxyphene: NEGATIVE ng/mL
Tapentadol, urine: NEGATIVE ng/mL
Tramadol Scrn, Ur: NEGATIVE ng/mL
Zolpidem, Urine: NEGATIVE ng/mL
pH, Initial: 6.8 pH (ref 4.5–8.9)

## 2014-05-18 NOTE — Progress Notes (Signed)
Urine drug screen for this encounter is negative for prescribed medication oxycodone though last taken 05/05/14 and test done on 1/716, therefore may be consistent.  Positive for low level metabolite of THC.

## 2014-05-21 ENCOUNTER — Other Ambulatory Visit (INDEPENDENT_AMBULATORY_CARE_PROVIDER_SITE_OTHER): Payer: Medicare Other

## 2014-05-21 ENCOUNTER — Ambulatory Visit (INDEPENDENT_AMBULATORY_CARE_PROVIDER_SITE_OTHER): Payer: Medicare Other | Admitting: Internal Medicine

## 2014-05-21 ENCOUNTER — Encounter: Payer: Self-pay | Admitting: Internal Medicine

## 2014-05-21 VITALS — BP 122/82 | HR 88 | Temp 98.1°F | Ht 68.0 in | Wt 167.5 lb

## 2014-05-21 DIAGNOSIS — N320 Bladder-neck obstruction: Secondary | ICD-10-CM | POA: Diagnosis not present

## 2014-05-21 DIAGNOSIS — J449 Chronic obstructive pulmonary disease, unspecified: Secondary | ICD-10-CM

## 2014-05-21 DIAGNOSIS — F17210 Nicotine dependence, cigarettes, uncomplicated: Secondary | ICD-10-CM

## 2014-05-21 DIAGNOSIS — Z720 Tobacco use: Secondary | ICD-10-CM | POA: Diagnosis not present

## 2014-05-21 DIAGNOSIS — R7989 Other specified abnormal findings of blood chemistry: Secondary | ICD-10-CM | POA: Diagnosis not present

## 2014-05-21 DIAGNOSIS — E785 Hyperlipidemia, unspecified: Secondary | ICD-10-CM | POA: Diagnosis not present

## 2014-05-21 DIAGNOSIS — F17200 Nicotine dependence, unspecified, uncomplicated: Secondary | ICD-10-CM

## 2014-05-21 LAB — CBC WITH DIFFERENTIAL/PLATELET
Basophils Absolute: 0.1 10*3/uL (ref 0.0–0.1)
Basophils Relative: 0.7 % (ref 0.0–3.0)
EOS PCT: 1.8 % (ref 0.0–5.0)
Eosinophils Absolute: 0.2 10*3/uL (ref 0.0–0.7)
HEMATOCRIT: 43.7 % (ref 39.0–52.0)
HEMOGLOBIN: 14.9 g/dL (ref 13.0–17.0)
Lymphocytes Relative: 25.1 % (ref 12.0–46.0)
Lymphs Abs: 2.8 10*3/uL (ref 0.7–4.0)
MCHC: 34 g/dL (ref 30.0–36.0)
MCV: 90.6 fl (ref 78.0–100.0)
Monocytes Absolute: 0.9 10*3/uL (ref 0.1–1.0)
Monocytes Relative: 8 % (ref 3.0–12.0)
NEUTROS ABS: 7.2 10*3/uL (ref 1.4–7.7)
NEUTROS PCT: 64.4 % (ref 43.0–77.0)
Platelets: 259 10*3/uL (ref 150.0–400.0)
RBC: 4.82 Mil/uL (ref 4.22–5.81)
RDW: 12.7 % (ref 11.5–15.5)
WBC: 11.2 10*3/uL — AB (ref 4.0–10.5)

## 2014-05-21 MED ORDER — ALBUTEROL SULFATE HFA 108 (90 BASE) MCG/ACT IN AERS: 2.0000 | INHALATION_SPRAY | Freq: Four times a day (QID) | RESPIRATORY_TRACT | Status: DC | PRN

## 2014-05-21 MED ORDER — SILDENAFIL CITRATE 100 MG PO TABS: 50.0000 mg | ORAL_TABLET | Freq: Every day | ORAL | Status: DC | PRN

## 2014-05-21 MED ORDER — TIOTROPIUM BROMIDE MONOHYDRATE 18 MCG IN CAPS: 18.0000 ug | ORAL_CAPSULE | Freq: Every day | RESPIRATORY_TRACT | Status: DC

## 2014-05-21 NOTE — Progress Notes (Signed)
Pre visit review using our clinic review tool, if applicable. No additional management support is needed unless otherwise documented below in the visit note. 

## 2014-05-21 NOTE — Patient Instructions (Signed)
Please take all new medication as prescribed - the viagra  Please continue all other medications as before, and refills have been done if requested.  Please have the pharmacy call with any other refills you may need.  Please continue your efforts at being more active, low cholesterol diet, and weight control.  You are otherwise up to date with prevention measures today.  Please keep your appointments with your specialists as you may have planned  Please go to the LAB in the Basement (turn left off the elevator) for the tests to be done today  You will be contacted by phone if any changes need to be made immediately.  Otherwise, you will receive a letter about your results with an explanation, but please check with MyChart first.  Please remember to sign up for MyChart if you have not done so, as this will be important to you in the future with finding out test results, communicating by private email, and scheduling acute appointments online when needed.  Please return in 1 year for your yearly visit, or sooner if needed

## 2014-05-21 NOTE — Progress Notes (Signed)
Subjective:    Patient ID: Cody Grant, male    DOB: 1961-01-09, 54 y.o.   MRN: [id number]  HPI  Here for yearly f/u;  Overall doing ok;  Pt denies CP, worsening SOB, DOE, wheezing, orthopnea, PND, worsening LE edema, palpitations, dizziness or syncope.  Pt denies neurological change such as new headache, facial or extremity weakness.  Pt denies polydipsia, polyuria, or low sugar symptoms. Pt states overall good compliance with treatment and medications, good tolerability, but has not been trying to follow lower cholesterol diet.  Pt denies worsening depressive symptoms, suicidal ideation or panic. No fever, night sweats, wt loss, loss of appetite, or other constitutional symptoms.  Pt states good ability with ADL's, has low fall risk, home safety reviewed and adequate, no other significant changes in hearing or vision, and only occasionally active with exercise.Still smoking but trying to transition to ecigs.  Spiriva does help with overally breating and doe. Also with worsening ED symptoms last 6 mo, asks for viagra Past Medical History  Diagnosis Date  . Asthma     as child  . Factor 5 Leiden mutation, heterozygous   . Allergic rhinitis, cause unspecified   . Lumbar disc disease   . Left foot drop   . Chronic lumbar radiculopathy     left with left drop foot/numbness  . Chronic pain     to see Dr Delynn Flavin  . Other and unspecified hyperlipidemia 05/20/2013  . 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 Spine surgery  12/2010    micro diskectomy    reports that he has been smoking Cigarettes.  He has a 30 pack-year smoking history. He has never used smokeless tobacco. He reports that he drinks about 1.2 - 2.4 oz of alcohol per week. He reports that he does not use illicit drugs. family history includes COPD in his mother; Diabetes in his mother; Pulmonary embolism in his father. No Known Allergies Current Outpatient Prescriptions on File Prior to Visit   Medication Sig Dispense Refill  . diclofenac (FLECTOR) 1.3 % PTCH Place 1 patch onto the skin as needed. 30 patch 2  . ibuprofen (ADVIL,MOTRIN) 400 MG tablet Take 1 tablet (400 mg total) by mouth every 8 (eight) hours as needed for pain. 90 tablet 1  . methocarbamol (ROBAXIN) 500 MG tablet Take 500 mg by mouth as needed.    Marland Kitchen NAPROXEN PO Take 1 tablet by mouth as directed.    Marland Kitchen oxyCODONE (OXY IR/ROXICODONE) 5 MG immediate release tablet Take 1 tablet (5 mg total) by mouth every 8 (eight) hours as needed. May take an extra tablet when pain is sever 100 tablet 0   No current facility-administered medications on file prior to visit.    Review of Systems  Constitutional: Negative for unusual diaphoresis or other sweats  HENT: Negative for ringing in ear Eyes: Negative for double vision or worsening visual disturbance.  Respiratory: Negative for choking and stridor.   Gastrointestinal: Negative for vomiting or other signifcant bowel change Genitourinary: Negative for hematuria or decreased urine volume.  Musculoskeletal: Negative for other MSK pain or swelling Skin: Negative for color change and worsening wound.  Neurological: Negative for tremors and numbness other than noted  Psychiatric/Behavioral: Negative for decreased concentration or agitation other than above       Objective:   Physical Exam BP 122/82 mmHg  Pulse 88  Temp(Src) 98.1 F (36.7 C) (Oral)  Ht 5\' 8"  (1.727 m)  Wt 167  lb 8 oz (75.978 kg)  BMI 25.47 kg/m2  SpO2 95% VS noted,  Constitutional: Pt appears well-developed, well-nourished.  HENT: Head: NCAT.  Right Ear: External ear normal.  Left Ear: External ear normal.  Eyes: . Pupils are equal, round, and reactive to light. Conjunctivae and EOM are normal Neck: Normal range of motion. Neck supple.  Cardiovascular: Normal rate and regular rhythm.   Pulmonary/Chest: Effort normal and decreased breath sounds without rales or wheezing.  Neurological: Pt is alert. Not  confused , motor grossly intact Skin: Skin is warm. No rash Psychiatric: Pt behavior is normal. No agitation.     Assessment & Plan:

## 2014-05-22 DIAGNOSIS — F17210 Nicotine dependence, cigarettes, uncomplicated: Secondary | ICD-10-CM | POA: Insufficient documentation

## 2014-05-22 NOTE — Assessment & Plan Note (Signed)
Counseled to quit 

## 2014-05-22 NOTE — Assessment & Plan Note (Signed)
Also for psa, as he is due, to f/u any worsening symptoms or concerns

## 2014-05-22 NOTE — Assessment & Plan Note (Signed)
Improved, stable overall by history and exam, recent data reviewed with pt, and pt to continue medical treatment as before,  to f/u any worsening symptoms or concerns SpO2 Readings from Last 3 Encounters:  05/21/14 95%  05/07/14 97%  04/10/14 95%

## 2014-05-22 NOTE — Assessment & Plan Note (Addendum)
stable overall by history and exam, recent data reviewed with pt, and pt to continue medical treatment as before,  to f/u any worsening symptoms or concerns Lab Results  Component Value Date   CHOL 208* 05/20/2013   HDL 38.90* 05/20/2013   LDLDIRECT 129.8 05/20/2013   TRIG 290.0* 05/20/2013   CHOLHDL 5 05/20/2013   D/w pt - goal ldl < 100, declines statin, cont's to work on diet

## 2014-05-25 ENCOUNTER — Telehealth: Payer: Self-pay | Admitting: *Deleted

## 2014-05-25 LAB — HEPATIC FUNCTION PANEL
ALK PHOS: 62 U/L (ref 39–117)
ALT: 33 U/L (ref 0–53)
AST: 27 U/L (ref 0–37)
Albumin: 4.5 g/dL (ref 3.5–5.2)
BILIRUBIN DIRECT: 0.1 mg/dL (ref 0.0–0.3)
Total Bilirubin: 0.5 mg/dL (ref 0.2–1.2)
Total Protein: 7 g/dL (ref 6.0–8.3)

## 2014-05-25 LAB — BASIC METABOLIC PANEL
BUN: 7 mg/dL (ref 6–23)
CHLORIDE: 107 meq/L (ref 96–112)
CO2: 25 meq/L (ref 19–32)
CREATININE: 0.77 mg/dL (ref 0.40–1.50)
Calcium: 9.6 mg/dL (ref 8.4–10.5)
GFR: 112.08 mL/min (ref >60.00)
Glucose, Bld: 93 mg/dL (ref 70–99)
Potassium: 4 mEq/L (ref 3.5–5.1)
Sodium: 144 mEq/L (ref 135–145)

## 2014-05-25 LAB — TSH: TSH: 1.78 u[IU]/mL (ref 0.35–4.50)

## 2014-05-25 LAB — LIPID PANEL
Cholesterol: 197 mg/dL (ref 0–200)
HDL: 41.7 mg/dL (ref >39.00)
NONHDL: 155.3
Total CHOL/HDL Ratio: 5
Triglycerides: 297 mg/dL — ABNORMAL HIGH (ref 0.0–149.0)
VLDL: 59.4 mg/dL — ABNORMAL HIGH (ref 0.0–40.0)

## 2014-05-25 LAB — PSA: PSA: 0.68 ng/mL (ref 0.10–4.00)

## 2014-05-25 LAB — LDL CHOLESTEROL, DIRECT: Direct LDL: 129 mg/dL

## 2014-05-25 NOTE — Telephone Encounter (Signed)
-----   Message from Charlett Blake, MD sent at 05/21/2014  5:19 PM EST ----- Discharge for [facility]

## 2014-05-27 ENCOUNTER — Other Ambulatory Visit (INDEPENDENT_AMBULATORY_CARE_PROVIDER_SITE_OTHER): Payer: Medicare Other

## 2014-05-27 DIAGNOSIS — E785 Hyperlipidemia, unspecified: Secondary | ICD-10-CM | POA: Diagnosis not present

## 2014-05-27 LAB — URINALYSIS, ROUTINE W REFLEX MICROSCOPIC
Bilirubin Urine: NEGATIVE
HGB URINE DIPSTICK: NEGATIVE
Ketones, ur: NEGATIVE
LEUKOCYTES UA: NEGATIVE
NITRITE: NEGATIVE
PH: 6.5 (ref 5.0–8.0)
RBC / HPF: NONE SEEN (ref 0–?)
Specific Gravity, Urine: 1.02 (ref 1.000–1.030)
Total Protein, Urine: NEGATIVE
UROBILINOGEN UA: 0.2 (ref 0.0–1.0)
Urine Glucose: NEGATIVE
WBC, UA: NONE SEEN (ref 0–?)

## 2014-05-27 MED ORDER — OXYCODONE HCL 5 MG PO TABS: 5.0000 mg | ORAL_TABLET | Freq: Three times a day (TID) | ORAL | Status: DC | PRN

## 2014-05-27 NOTE — Telephone Encounter (Signed)
Left message for Cody Grant to call the office and ask for me so that I can discuss his UDS results and his discharge from the clinic.

## 2014-05-27 NOTE — Telephone Encounter (Signed)
Mr Cody Grant called back and I gave him the news that his UDS was +thc.  He said that it was a one time thing on New Years Eve with family and "they had these edibles, and I had a brownie--I don't do these things"  I explained that unfortunately it is an illegal drug in this state and therefore Dr Letta Pate is going to discharge.  We will have his last rx available for pick up on Thursday afternoon or Friday, and he will receive a letter in the mail with a list of area pain clinics his PCP can refer him to.

## 2014-06-11 ENCOUNTER — Ambulatory Visit: Payer: Self-pay | Admitting: Registered Nurse

## 2014-07-06 ENCOUNTER — Ambulatory Visit: Payer: Self-pay | Admitting: Physical Medicine & Rehabilitation

## 2015-06-14 ENCOUNTER — Encounter: Payer: Self-pay | Admitting: Internal Medicine

## 2015-06-14 ENCOUNTER — Ambulatory Visit (INDEPENDENT_AMBULATORY_CARE_PROVIDER_SITE_OTHER): Payer: Commercial Managed Care - HMO | Admitting: Internal Medicine

## 2015-06-14 ENCOUNTER — Other Ambulatory Visit: Payer: Self-pay | Admitting: Internal Medicine

## 2015-06-14 VITALS — BP 130/80 | HR 72 | Temp 98.1°F | Ht 68.0 in | Wt 176.5 lb

## 2015-06-14 DIAGNOSIS — J449 Chronic obstructive pulmonary disease, unspecified: Secondary | ICD-10-CM | POA: Diagnosis not present

## 2015-06-14 DIAGNOSIS — E785 Hyperlipidemia, unspecified: Secondary | ICD-10-CM

## 2015-06-14 DIAGNOSIS — Z0000 Encounter for general adult medical examination without abnormal findings: Secondary | ICD-10-CM | POA: Diagnosis not present

## 2015-06-14 DIAGNOSIS — G8929 Other chronic pain: Secondary | ICD-10-CM

## 2015-06-14 DIAGNOSIS — L309 Dermatitis, unspecified: Secondary | ICD-10-CM | POA: Diagnosis not present

## 2015-06-14 MED ORDER — OXYCODONE HCL 5 MG PO TABS: 5.0000 mg | ORAL_TABLET | Freq: Three times a day (TID) | ORAL | Status: DC | PRN

## 2015-06-14 MED ORDER — TRIAMCINOLONE ACETONIDE 0.1 % EX CREA: 1.0000 "application " | TOPICAL_CREAM | Freq: Two times a day (BID) | CUTANEOUS | Status: DC

## 2015-06-14 NOTE — Assessment & Plan Note (Signed)
Pt states spiriva not covered, will ask pharmacy regarding similar covered by insurance and let us know, o/w stable overall by history and exam, recent data reviewed with pt, and pt to continue medical treatment as before,  to f/u any worsening symptoms or concerns SpO2 Readings from Last 3 Encounters:  06/14/15 96%  05/21/14 95%  05/07/14 97%

## 2015-06-14 NOTE — Assessment & Plan Note (Signed)
For one mo oxycodone, refer pain management

## 2015-06-14 NOTE — Assessment & Plan Note (Addendum)

## 2015-06-14 NOTE — Patient Instructions (Addendum)
Please continue all other medications as before, and refills have been done if requested - the oxycodone  Please have the pharmacy call with any other refills you may need.  Please have pharmacy call if there is an alternative to the spiriva covered by your insurance  Please continue your efforts at being more active, low cholesterol diet, and weight control.  You are otherwise up to date with prevention measures today.  Please keep your appointments with your specialists as you may have planned  You will be contacted regarding the referral for: cologuard, and pain management  Please go to the LAB in the Basement (turn left off the elevator) for the tests to be done today  You will be contacted by phone if any changes need to be made immediately.  Otherwise, you will receive a letter about your results with an explanation, but please check with MyChart first.  Please remember to sign up for MyChart if you have not done so, as this will be important to you in the future with finding out test results, communicating by private email, and scheduling acute appointments online when needed.  Please return in 6 months, or sooner if needed

## 2015-06-14 NOTE — Progress Notes (Signed)
Subjective:    Patient ID: Cody Grant, male    DOB: 29-Apr-1961, 55 y.o.   MRN: [id number]  HPI  Here for wellness and f/u;  Overall doing ok;  Pt denies Chest pain, worsening SOB, DOE, wheezing, orthopnea, PND, worsening LE edema, palpitations, dizziness or syncope.  Pt denies neurological change such as new headache, facial or extremity weakness.  Pt denies polydipsia, polyuria, or low sugar symptoms. Pt states overall good compliance with treatment and medications, good tolerability, and has been trying to follow appropriate diet.  Pt denies worsening depressive symptoms, suicidal ideation or panic. No fever, night sweats, wt loss, loss of appetite, or other constitutional symptoms.  Pt states good ability with ADL's, has low fall risk, home safety reviewed and adequate, no other significant changes in hearing or vision, and only occasionally active with exercise.   Has intermittent worsening of chronci left foot paresthesia with total numbness and weakness due to left sciatica flares, has faallen x 1 in the past yr, had some back pain, but resolved after. Declines immunuzations, and colonoscopy except will do the cologuard.  hsa ongoing chronic pain, let go from pain clinic as he failed a blood test for THC.  Has been using friends meds, last rx oxycodone was jan 2016. Also mentions flaky crust to ear canals.  Pt continues to have recurring LBP without change in severity, bowel or bladder change, fever, wt loss,  worsening LE pain/numbness/weakness, gait change or falls.except for that mentiond above.  Spiriva no longer covered.  Past Medical History  Diagnosis Date  . Asthma     as child  . Factor 5 Leiden mutation, heterozygous ([place])   . Allergic rhinitis, cause unspecified   . Lumbar disc disease   . Left foot drop   . Chronic lumbar radiculopathy     left with left drop foot/numbness  . Chronic pain     to see Dr Delynn Flavin  . Other and unspecified hyperlipidemia 05/20/2013  . COPD,  severe ([place]) 06/22/2013   Past Surgical History  Procedure Laterality Date  . Spine surgery  12/2010    micro diskectomy    reports that he has been smoking Cigarettes.  He has a 30 pack-year smoking history. He has never used smokeless tobacco. He reports that he drinks about 1.2 - 2.4 oz of alcohol per week. He reports that he does not use illicit drugs. family history includes COPD in his mother; Diabetes in his mother; Pulmonary embolism in his father. No Known Allergies Current Outpatient Prescriptions on File Prior to Visit  Medication Sig Dispense Refill  . albuterol (PROAIR HFA) 108 (90 BASE) MCG/ACT inhaler Inhale 2 puffs into the lungs every 6 (six) hours as needed for wheezing or shortness of breath. 1 Inhaler 11  . diclofenac (FLECTOR) 1.3 % PTCH Place 1 patch onto the skin as needed. 30 patch 2  . ibuprofen (ADVIL,MOTRIN) 400 MG tablet Take 1 tablet (400 mg total) by mouth every 8 (eight) hours as needed for pain. 90 tablet 1  . methocarbamol (ROBAXIN) 500 MG tablet Take 500 mg by mouth as needed. Reported on 06/14/2015    . NAPROXEN PO Take 1 tablet by mouth as directed.    . sildenafil (VIAGRA) 100 MG tablet Take 0.5-1 tablets (50-100 mg total) by mouth daily as needed for erectile dysfunction. 5 tablet 11  . tiotropium (SPIRIVA HANDIHALER) 18 MCG inhalation capsule Place 1 capsule (18 mcg total) into inhaler and inhale daily. 30 capsule 12  No current facility-administered medications on file prior to visit.    Review of Systems Constitutional: Negative for increased diaphoresis, other activity, appetite or siginficant weight change other than noted HENT: Negative for worsening hearing loss, ear pain, facial swelling, mouth sores and neck stiffness.   Eyes: Negative for other worsening pain, redness or visual disturbance.  Respiratory: Negative for shortness of breath and wheezing  Cardiovascular: Negative for chest pain and palpitations.  Gastrointestinal: Negative for  diarrhea, blood in stool, abdominal distention or other pain Genitourinary: Negative for hematuria, flank pain or change in urine volume.  Musculoskeletal: Negative for myalgias or other joint complaints.  Skin: Negative for color change and wound or drainage.  Neurological: Negative for syncope and numbness. other than noted Hematological: Negative for adenopathy. or other swelling Psychiatric/Behavioral: Negative for hallucinations, SI, self-injury, decreased concentration or other worsening agitation.      Objective:   Physical Exam BP 130/80 mmHg  Pulse 72  Temp(Src) 98.1 F (36.7 C) (Oral)  Ht 5\' 8"  (1.727 m)  Wt 176 lb 8 oz (80.06 kg)  BMI 26.84 kg/m2  SpO2 96% VS noted,  Constitutional: Pt is oriented to person, place, and time. Appears well-developed and well-nourished, in no significant distress Head: Normocephalic and atraumatic.  Right Ear: External ear normal.  Left Ear: External ear normal.  Nose: Nose normal.  Mouth/Throat: Oropharynx is clear and moist.  Eyes: Conjunctivae and EOM are normal. Pupils are equal, round, and reactive to light.  Neck: Normal range of motion. Neck supple. No JVD present. No tracheal deviation present or significant neck LA or mass Cardiovascular: Normal rate, regular rhythm, normal heart sounds and intact distal pulses.   Pulmonary/Chest: Effort normal and breath decreased sounds without rales or wheezing  Abdominal: Soft. Bowel sounds are normal. NT. No HSM  Musculoskeletal: Normal range of motion. Exhibits no edema.  Lymphadenopathy:  Has no cervical adenopathy.  Neurological: Pt is alert and oriented to person, place, and time. Pt has normal reflexes. No cranial nerve deficit. Motor grossly intact Skin: Skin is warm and dry. No rash noted. except eczema type rash to bilat elbows and mild ear canals Psychiatric:  Has normal mood and affect. Behavior is normal.     Assessment & Plan:

## 2015-06-14 NOTE — Assessment & Plan Note (Signed)
Ok for cont'd diet, f/u labs, consider statin, goal ldl < 100

## 2015-06-14 NOTE — Progress Notes (Signed)
Pre visit review using our clinic review tool, if applicable. No additional management support is needed unless otherwise documented below in the visit note. 

## 2015-06-15 ENCOUNTER — Other Ambulatory Visit (INDEPENDENT_AMBULATORY_CARE_PROVIDER_SITE_OTHER): Payer: Commercial Managed Care - HMO

## 2015-06-15 DIAGNOSIS — R7989 Other specified abnormal findings of blood chemistry: Secondary | ICD-10-CM | POA: Diagnosis not present

## 2015-06-15 DIAGNOSIS — Z0000 Encounter for general adult medical examination without abnormal findings: Secondary | ICD-10-CM

## 2015-06-15 LAB — LIPID PANEL
CHOL/HDL RATIO: 6
Cholesterol: 214 mg/dL — ABNORMAL HIGH (ref 0–200)
HDL: 38 mg/dL — AB (ref >39.00)
NONHDL: 176.03
TRIGLYCERIDES: 326 mg/dL — AB (ref 0.0–149.0)
VLDL: 65.2 mg/dL — AB (ref 0.0–40.0)

## 2015-06-15 LAB — HEPATIC FUNCTION PANEL
ALK PHOS: 48 U/L (ref 39–117)
ALT: 23 U/L (ref 0–53)
AST: 21 U/L (ref 0–37)
Albumin: 4.7 g/dL (ref 3.5–5.2)
BILIRUBIN DIRECT: 0.1 mg/dL (ref 0.0–0.3)
BILIRUBIN TOTAL: 0.6 mg/dL (ref 0.2–1.2)
Total Protein: 7.1 g/dL (ref 6.0–8.3)

## 2015-06-15 LAB — BASIC METABOLIC PANEL
BUN: 7 mg/dL (ref 6–23)
CALCIUM: 9.5 mg/dL (ref 8.4–10.5)
CO2: 29 mEq/L (ref 19–32)
CREATININE: 0.89 mg/dL (ref 0.40–1.50)
Chloride: 103 mEq/L (ref 96–112)
GFR: 94.46 mL/min (ref >60.00)
Glucose, Bld: 81 mg/dL (ref 70–99)
Potassium: 4.4 mEq/L (ref 3.5–5.1)
SODIUM: 140 meq/L (ref 135–145)

## 2015-06-15 LAB — CBC WITH DIFFERENTIAL/PLATELET
Basophils Absolute: 0.1 10*3/uL (ref 0.0–0.1)
Basophils Relative: 0.5 % (ref 0.0–3.0)
EOS PCT: 3.1 % (ref 0.0–5.0)
Eosinophils Absolute: 0.3 10*3/uL (ref 0.0–0.7)
HEMATOCRIT: 45.7 % (ref 39.0–52.0)
Hemoglobin: 15.8 g/dL (ref 13.0–17.0)
LYMPHS ABS: 2.4 10*3/uL (ref 0.7–4.0)
LYMPHS PCT: 24.2 % (ref 12.0–46.0)
MCHC: 34.7 g/dL (ref 30.0–36.0)
MCV: 90.6 fl (ref 78.0–100.0)
MONOS PCT: 8.6 % (ref 3.0–12.0)
Monocytes Absolute: 0.9 10*3/uL (ref 0.1–1.0)
NEUTROS ABS: 6.3 10*3/uL (ref 1.4–7.7)
NEUTROS PCT: 63.6 % (ref 43.0–77.0)
PLATELETS: 247 10*3/uL (ref 150.0–400.0)
RBC: 5.04 Mil/uL (ref 4.22–5.81)
RDW: 13.1 % (ref 11.5–15.5)
WBC: 9.9 10*3/uL (ref 4.0–10.5)

## 2015-06-15 LAB — URINALYSIS, ROUTINE W REFLEX MICROSCOPIC
BILIRUBIN URINE: NEGATIVE
Hgb urine dipstick: NEGATIVE
KETONES UR: NEGATIVE
LEUKOCYTES UA: NEGATIVE
Nitrite: NEGATIVE
PH: 6.5 (ref 5.0–8.0)
RBC / HPF: NONE SEEN (ref 0–?)
Specific Gravity, Urine: <=1.005 — AB (ref 1.000–1.030)
Total Protein, Urine: NEGATIVE
UROBILINOGEN UA: 0.2 (ref 0.0–1.0)
Urine Glucose: NEGATIVE
WBC UA: NONE SEEN (ref 0–?)

## 2015-06-15 LAB — TSH: TSH: 1.73 u[IU]/mL (ref 0.35–4.50)

## 2015-06-15 LAB — PSA: PSA: 1.04 ng/mL (ref 0.10–4.00)

## 2015-06-15 LAB — LDL CHOLESTEROL, DIRECT: Direct LDL: 127 mg/dL

## 2015-06-15 NOTE — Assessment & Plan Note (Signed)
Mild, for triam cr prn,  to f/u any worsening symptoms or concerns 

## 2015-07-06 DIAGNOSIS — Z1212 Encounter for screening for malignant neoplasm of rectum: Secondary | ICD-10-CM | POA: Diagnosis not present

## 2015-07-06 DIAGNOSIS — Z1211 Encounter for screening for malignant neoplasm of colon: Secondary | ICD-10-CM | POA: Diagnosis not present

## 2015-07-06 LAB — COLOGUARD: Cologuard: NEGATIVE

## 2015-07-19 ENCOUNTER — Telehealth: Payer: Self-pay | Admitting: Internal Medicine

## 2015-07-19 NOTE — Telephone Encounter (Signed)
Pt was following up on 2 things:  -He is still waiting to hear from pain mgmt and he is requesting his oxyCODONE (OXY IR/ROXICODONE) 5 MG immediate release tablet [id number]   -He did his cologuard and they received it on 3/8 and he hasn't heard anything yet regarding it.   His best number is [phone]

## 2015-07-20 MED ORDER — OXYCODONE HCL 5 MG PO TABS: 5.0000 mg | ORAL_TABLET | Freq: Three times a day (TID) | ORAL | Status: DC | PRN

## 2015-07-20 NOTE — Telephone Encounter (Signed)
Ok for one more month oxycodone, but please let pt know, I cannot provide further prescriptions, even if he does not get an appt with pain clinic soon, as I do not practice chronic pain medicine  I have not seen results cologuard  Corinne - have you seen results or know how to find out?

## 2015-07-20 NOTE — Telephone Encounter (Signed)
Please advise, patient is requesting refill, has this already been done by other means?

## 2015-07-21 LAB — COLOGUARD: Cologuard: NEGATIVE

## 2015-08-24 ENCOUNTER — Other Ambulatory Visit: Payer: Self-pay | Admitting: Physical Medicine & Rehabilitation

## 2015-08-24 ENCOUNTER — Other Ambulatory Visit: Payer: Self-pay | Admitting: Internal Medicine

## 2015-08-24 DIAGNOSIS — Z1159 Encounter for screening for other viral diseases: Secondary | ICD-10-CM

## 2015-08-24 NOTE — Addendum Note (Signed)
Addended by: Biagio Borg on: 08/24/2015 12:46 PM   Modules accepted: Orders

## 2015-09-09 ENCOUNTER — Encounter: Payer: Self-pay | Admitting: Internal Medicine

## 2015-09-30 ENCOUNTER — Encounter: Payer: Self-pay | Admitting: Internal Medicine

## 2015-09-30 ENCOUNTER — Ambulatory Visit (INDEPENDENT_AMBULATORY_CARE_PROVIDER_SITE_OTHER): Payer: Commercial Managed Care - HMO | Admitting: Internal Medicine

## 2015-09-30 VITALS — BP 130/72 | HR 94 | Resp 20 | Wt 171.0 lb

## 2015-09-30 DIAGNOSIS — L309 Dermatitis, unspecified: Secondary | ICD-10-CM

## 2015-09-30 DIAGNOSIS — J449 Chronic obstructive pulmonary disease, unspecified: Secondary | ICD-10-CM | POA: Diagnosis not present

## 2015-09-30 DIAGNOSIS — M25511 Pain in right shoulder: Secondary | ICD-10-CM | POA: Diagnosis not present

## 2015-09-30 MED ORDER — ALBUTEROL SULFATE 0.63 MG/3ML IN NEBU: 1.0000 | INHALATION_SOLUTION | Freq: Four times a day (QID) | RESPIRATORY_TRACT | Status: DC | PRN

## 2015-09-30 MED ORDER — FLUOCINONIDE 0.05 % EX CREA: 1.0000 "application " | TOPICAL_CREAM | Freq: Two times a day (BID) | CUTANEOUS | Status: DC

## 2015-09-30 NOTE — Patient Instructions (Signed)
Please take all new medication as prescribed - the nebulizer medication, and the cream  You will be contacted regarding the referral for: Dr Smith/sports medicine  Please continue all other medications as before, and refills have been done if requested.  Please have the pharmacy call with any other refills you may need  Please keep your appointments with your specialists as you may have planned

## 2015-09-30 NOTE — Progress Notes (Signed)
Pre visit review using our clinic review tool, if applicable. No additional management support is needed unless otherwise documented below in the visit note. 

## 2015-09-30 NOTE — Progress Notes (Signed)
Subjective:    Patient ID: Cody Grant, male    DOB: 1961/03/18, 55 y.o.   MRN: [id number]  HPI  Here with 2 mo onset moderate sharp persistent right shoulder pain and gradually worsenig ROM now with severe pain to abduct to less than 90 degrees;  Worse to walk the dog, did have a fall about 2 mo ago and dog tends to pull hard on the right arm with leash. Nothing else makes better or worse.  Also with several wks worsening slight itchy but scaly increased erythema nontender bilat elbow rashes/  Spiriva is over $300, and other inhalers are expensive as well. Pt denies chest pain, increased sob or doe, wheezing, orthopnea, PND, increased LE swelling, palpitations, dizziness or syncope. Pt denies new neurological symptoms such as new headache, or facial or extremity weakness or numbness   Pt denies polydipsia, polyuria / Past Medical History  Diagnosis Date  . Asthma     as child  . Factor 5 Leiden mutation, heterozygous ([place])   . Allergic rhinitis, cause unspecified   . Lumbar disc disease   . Left foot drop   . Chronic lumbar radiculopathy     left with left drop foot/numbness  . Chronic pain     to see Dr Delynn Flavin  . Other and unspecified hyperlipidemia 05/20/2013  . COPD, severe ([place]) 06/22/2013   Past Surgical History  Procedure Laterality Date  . Spine surgery  12/2010    micro diskectomy    reports that he has been smoking Cigarettes.  He has a 30 pack-year smoking history. He has never used smokeless tobacco. He reports that he drinks about 1.2 - 2.4 oz of alcohol per week. He reports that he does not use illicit drugs. family history includes COPD in his mother; Diabetes in his mother; Pulmonary embolism in his father. No Known Allergies Current Outpatient Prescriptions on File Prior to Visit  Medication Sig Dispense Refill  . diclofenac (FLECTOR) 1.3 % PTCH Place 1 patch onto the skin as needed. 30 patch 2  . ibuprofen (ADVIL,MOTRIN) 400 MG tablet Take 1 tablet (400 mg total)  by mouth every 8 (eight) hours as needed for pain. 90 tablet 1  . methocarbamol (ROBAXIN) 500 MG tablet Take 500 mg by mouth as needed. Reported on 06/14/2015    . NAPROXEN PO Take 1 tablet by mouth as directed.    Marland Kitchen oxyCODONE (OXY IR/ROXICODONE) 5 MG immediate release tablet Take 1 tablet (5 mg total) by mouth every 8 (eight) hours as needed. May take an extra tablet for severe pain occasionally 100 tablet 0  . PROAIR HFA 108 (90 Base) MCG/ACT inhaler INHALE 2 PUFFS INTO THE LUNGS EVERY 6 HOURS AS NEEDED FOR WHEEZING OR SHORTNESS OF BREATH. 8.5 g 5  . sildenafil (VIAGRA) 100 MG tablet Take 0.5-1 tablets (50-100 mg total) by mouth daily as needed for erectile dysfunction. 5 tablet 11  . tiotropium (SPIRIVA HANDIHALER) 18 MCG inhalation capsule Place 1 capsule (18 mcg total) into inhaler and inhale daily. 30 capsule 12  . triamcinolone cream (KENALOG) 0.1 % Apply 1 application topically 2 (two) times daily. 30 g 0   No current facility-administered medications on file prior to visit.   Review of Systems  Constitutional: Negative for unusual diaphoresis or night sweats HENT: Negative for ear swelling or discharge Eyes: Negative for worsening visual haziness  Respiratory: Negative for choking and stridor.   Gastrointestinal: Negative for distension or worsening eructation Genitourinary: Negative for retention or change in  urine volume.  Musculoskeletal: Negative for other MSK pain or swelling Skin: Negative for color change and worsening wound Neurological: Negative for tremors and numbness other than noted  Psychiatric/Behavioral: Negative for decreased concentration or agitation other than above       Objective:   Physical Exam BP 130/72 mmHg  Pulse 94  Resp 20  Wt 171 lb (77.565 kg)  SpO2 95% VS noted, not ill appearing Constitutional:Pt appears in no apparent distress HENT: Head: NCAT.  Right Ear: External ear normal.  Left Ear: External ear normal.  Eyes: . Pupils are equal,  round, and reactive to light. Conjunctivae and EOM are normal Neck: Normal range of motion. Neck supple.  Cardiovascular: Normal rate and regular rhythm.   Pulmonary/Chest: Effort normal and breath sounds without rales or wheezing.  Abd:  Soft, NT, ND, + BS Neurological: Pt is alert. Not confused , motor grossly intact Skin: Skin is warm. + bilat scaly rash to elbows, no LE edema Psychiatric: Pt behavior is normal. No agitation.  Right shoulder diffuse tender, worse pain to abuct and forward elevated to 80 degrees       Assessment & Plan:

## 2015-10-03 DIAGNOSIS — M25511 Pain in right shoulder: Secondary | ICD-10-CM | POA: Insufficient documentation

## 2015-10-03 NOTE — Assessment & Plan Note (Signed)
Mild to mod, for topical steroid cream prn,   to f/u any worsening symptoms or concerns

## 2015-10-03 NOTE — Assessment & Plan Note (Signed)
mod, ot cont inhalers as able, also add nebulizer for home use, no need for antibx course,  to f/u any worsening symptoms or concerns

## 2015-10-03 NOTE — Assessment & Plan Note (Signed)
Mod to severe, c/w possible OA vs tendonitis, for cont'd pain med as has, also for referrral sport medicine,  to f/u any worsening symptoms or concerns

## 2015-11-10 ENCOUNTER — Ambulatory Visit (INDEPENDENT_AMBULATORY_CARE_PROVIDER_SITE_OTHER)
Admission: RE | Admit: 2015-11-10 | Discharge: 2015-11-10 | Disposition: A | Discharge status: Home / Self Care | Payer: Commercial Managed Care - HMO | Source: Ambulatory Visit | Attending: Family Medicine | Admitting: Family Medicine

## 2015-11-10 ENCOUNTER — Ambulatory Visit (INDEPENDENT_AMBULATORY_CARE_PROVIDER_SITE_OTHER): Payer: Commercial Managed Care - HMO | Admitting: Family Medicine

## 2015-11-10 ENCOUNTER — Encounter: Payer: Self-pay | Admitting: Family Medicine

## 2015-11-10 ENCOUNTER — Other Ambulatory Visit: Payer: Self-pay

## 2015-11-10 ENCOUNTER — Other Ambulatory Visit: Payer: Self-pay | Admitting: Family Medicine

## 2015-11-10 VITALS — BP 116/80 | HR 96 | Ht 68.5 in | Wt 170.0 lb

## 2015-11-10 DIAGNOSIS — M75101 Unspecified rotator cuff tear or rupture of right shoulder, not specified as traumatic: Secondary | ICD-10-CM | POA: Diagnosis not present

## 2015-11-10 DIAGNOSIS — M25511 Pain in right shoulder: Secondary | ICD-10-CM

## 2015-11-10 DIAGNOSIS — R0989 Other specified symptoms and signs involving the circulatory and respiratory systems: Secondary | ICD-10-CM | POA: Diagnosis not present

## 2015-11-10 MED ORDER — TIZANIDINE HCL 4 MG PO TABS: 4.0000 mg | ORAL_TABLET | Freq: Three times a day (TID) | ORAL | Status: DC | PRN

## 2015-11-10 NOTE — Progress Notes (Signed)
[facility] [address] [place], [place] [address] Phone: [phone] Subjective:    I'm seeing this patient by the request  of:  Cathlean Cower, MD   CC: Right shoulder pain  [id number] Cody Grant is a 55 y.o. male coming in with complaint of right shoulder pain. Past medical history is significant for chronic pain and was on chronic pain medications for quite some time. Last refill cement from primary care provider. Patient also is a positive history for COPD. Patient states that he has had right shoulder pain for greater than 2 months. Does not remember any true injury. Does walk his dog and sometimes it seems to [place]. Woke up the next morning after a long walk and was unable to move his arm. Patient states that since then he is started to slowly start to be able to increase his range of motion but has weakness compared to the contralateral side. Patient states that it seems to have stopped improving over the course last week. Patient states that is severely sore at night. Rates the severity of pain a 9 out of 10. Patient was getting chronic pain medications but at the moment is not taking them and he is waiting for referral to pain management. Patient does have chronic sciatica of his lower back and is on disability states. Patient states that the pain does wake him up at night it is affecting daily activities.     Past Medical History  Diagnosis Date  . Asthma     as child  . Factor 5 Leiden mutation, heterozygous ([place])   . Allergic rhinitis, cause unspecified   . Lumbar disc disease   . Left foot drop   . Chronic lumbar radiculopathy     left with left drop foot/numbness  . Chronic pain     to see Dr Delynn Flavin  . Other and unspecified hyperlipidemia 05/20/2013  . COPD, severe ([place]) 06/22/2013   Past Surgical History  Procedure Laterality Date  . Spine surgery  12/2010    micro diskectomy   Social History   Social History  . Marital  Status: Unknown    Spouse Name: N/A  . Number of Children: N/A  . Years of Education: N/A   Occupational History  . Disabled    Social History Main Topics  . Smoking status: Current Every Day Smoker -- 1.00 packs/day for 30 years    Types: Cigarettes  . Smokeless tobacco: Never Used  . Alcohol Use: 1.2 - 2.4 oz/week    1-2 Cans of beer, 1-2 Glasses of wine per week  . Drug Use: No  . Sexual Activity: Not Asked   Other Topics Concern  . None   Social History Narrative   No Known Allergies Family History  Problem Relation Age of Onset  . COPD Mother   . Diabetes Mother   . Pulmonary embolism Father     Past medical history, social, surgical and family history all reviewed in electronic medical record.  No pertanent information unless stated regarding to the chief complaint.   Review of Systems: No headache, visual changes, nausea, vomiting, diarrhea, constipation, dizziness, abdominal pain, skin rash, fevers, chills, night sweats, weight loss, swollen lymph nodes, body aches, joint swelling, muscle aches, chest pain, shortness of breath, mood changes.   Objective Blood pressure 116/80, pulse 96, height 5' 8.5" (1.74 m), weight 170 lb (77.111 kg), SpO2 95 %.  General: No apparent distress alert and oriented x3 mood and affect  normal, dressed appropriately.  HEENT: Pupils equal, extraocular movements intact  Respiratory: Patient's speak in full sentences and does not appear short of breath  Cardiovascular: No lower extremity edema, non tender, no erythema  Skin: Warm dry intact with no signs of infection or rash on extremities or on axial skeleton.  Abdomen: Soft nontender  Neuro: Cranial nerves II through XII are intact, neurovascularly intact in all extremities with 2+ DTRs and 2+ pulses.  Lymph: No lymphadenopathy of posterior or anterior cervical chain or axillae bilaterally.  Gait normal with good balance and coordination.  MSK:  Non tender with full range of motion and  good stability and symmetric strength and tone of  elbows, wrist, hip, knee and ankles bilaterally.  Shoulder: Right Inspection reveals no abnormalities, atrophy or asymmetry. Palpation is normal with no tenderness over AC joint or bicipital groove. Limited range of motion lacking the last 10 of external rotation. Internal rotation to sacrum and patient does have limitation of approximately 160 of forward flexion Rotator cuff strength 4 out of 5 compared to contralateral side signs of impingement with positive Neer and Hawkin's tests, but negative empty can sign. Speeds and Yergason's tests normal. Positive O'Brien's Normal scapular function observed. No painful arc and no drop arm sign. No apprehension sign Contralateral shoulder unremarkable  MSK US performed of: Right This study was ordered, performed, and interpreted by Charlann Boxer D.O.  Shoulder:   Supraspinatus: Large articular side tear movements greater than 50% of the tendon. Mild retraction noted. Significant hypoechoic changes and increasing Doppler flow Infraspinatus:  Appears normal on long and transverse views. Significant increase in Doppler flow Subscapularis:  Large high-grade partial thickness tear of the tendon but no true retraction Teres Minor:  Appears normal on long and transverse views. AC joint:  Capsule undistended, no geyser sign. Glenohumeral Joint:  Appears normal without effusion. Glenoid Labrum:  Intact without visualized tears. Biceps Tendon:  Appears normal on long and transverse views, no fraying of tendon, tendon located in intertubercular groove, no subluxation with shoulder internal or external rotation.  Impression: Large full-thickness tear of the subscapularis and partial tear of the supraspinatus. Would consider high-grade    Impression and Recommendations:     This case required medical decision making of moderate complexity.      Note: This dictation was prepared with Dragon dictation  along with smaller phrase technology. Any transcriptional errors that result from this process are unintentional.

## 2015-11-10 NOTE — Patient Instructions (Addendum)
Good to see you.  Xray downstairs today  Ice 20 minutes 2 times daily. Usually after activity and before bed. Exercises 3 times a week. Physical therapy will be calling you and just tell them you need to learn the exercises for home.  pennsaid pinkie amount topically 2 times daily as needed.  Zanaflex up to 3 times  If weakness gets worse we may want to consider the injection or even MRI.  See me again in 4 weeks.

## 2015-11-10 NOTE — Assessment & Plan Note (Addendum)
Patient does have what appears to be a rotator cuff tear. With patient's history of COPD will also get a chest x-ray to make sure there is no pneumonia to be contributing to the amount of discomfort he is having. Patient declined any type of injection today. Will be referred to formal physical therapy. Given some muscle relaxer to see if that will help with some of the pain. X-rays ordered today to further evaluate as we discussed previously. Patient even trial topical anti-inflammatories. Patient will come back and see me again in 4 weeks. At that time if continuing have pain we'll consider injection versus possible advance imaging

## 2015-11-10 NOTE — Telephone Encounter (Signed)
Re-sent rx to pharmacy. Per dr Tamala Julian, pt cannot have more than 90-tablets at one time.

## 2015-11-18 ENCOUNTER — Ambulatory Visit: Payer: Commercial Managed Care - HMO | Attending: Internal Medicine | Admitting: Physical Therapy

## 2015-11-18 DIAGNOSIS — M25611 Stiffness of right shoulder, not elsewhere classified: Secondary | ICD-10-CM | POA: Diagnosis not present

## 2015-11-18 DIAGNOSIS — M25511 Pain in right shoulder: Secondary | ICD-10-CM | POA: Insufficient documentation

## 2015-11-18 DIAGNOSIS — R293 Abnormal posture: Secondary | ICD-10-CM | POA: Diagnosis not present

## 2015-11-18 DIAGNOSIS — M6281 Muscle weakness (generalized): Secondary | ICD-10-CM | POA: Insufficient documentation

## 2015-11-18 NOTE — Therapy (Signed)
[place], [place], [address] Phone: [phone]   Fax:  [phone]  Physical Therapy Evaluation  Patient Details  Name: Cody Grant MRN: [id number] Date of Birth: 06/05/1960 Referring Provider: Hulan Saas DO  Encounter Date: 11/18/2015      PT End of Session - 11/18/15 1508    Visit Number 1   Number of Visits 5   Date for PT Re-Evaluation 01/13/16   Authorization Type Medicare: kx mod by 15th visit, progress note by 10th visit   PT Start Time 1415   PT Stop Time 1505   PT Time Calculation (min) 50 min   Activity Tolerance Patient tolerated treatment well   Behavior During Therapy [facility] for tasks assessed/performed      Past Medical History  Diagnosis Date  . Asthma     as child  . Factor 5 Leiden mutation, heterozygous ([place])   . Allergic rhinitis, cause unspecified   . Lumbar disc disease   . Left foot drop   . Chronic lumbar radiculopathy     left with left drop foot/numbness  . Chronic pain     to see Dr Delynn Flavin  . Other and unspecified hyperlipidemia 05/20/2013  . COPD, severe ([place]) 06/22/2013    Past Surgical History  Procedure Laterality Date  . Spine surgery  12/2010    micro diskectomy    There were no vitals filed for this visit.       Subjective Assessment - 11/18/15 1421    Subjective pt is a 55 y.o M with CC or R shoulder pain that started insidously 6 months ago. pain stays in the shoulder, denies any referral of pain. gradually worsened with the pain since he noticed it 6 months ago. saw his MD and reported he had rotator cuff tear via [place]. Biggest issue is with lifting and strength.    How long can you sit comfortably? 20 min   How long can you stand comfortably? 30 min   How long can you walk comfortably? 30 min   Diagnostic tests [place] at MD's office   Patient Stated Goals decrease pain, improve shoulder mobility, increase strength    Currently in Pain? Yes   Pain  Score 5    Pain Location Shoulder   Pain Orientation Right   Pain Descriptors / Indicators Dull;Aching;Throbbing  changes depending on activity   Pain Type Chronic pain   Pain Onset More than a month ago   Pain Frequency Intermittent   Aggravating Factors  movement R arm, lifting and carry, bumping the R hand, pulling   Pain Relieving Factors resting, muscle relaxer            [facility] PT Assessment - 11/18/15 1403    Assessment   Medical Diagnosis R shoulder pain  Rotator cuff tear pathology   Referring Provider Hulan Saas DO   Onset Date/Surgical Date --  6 months   Hand Dominance Right   Precautions   Precautions None   Restrictions   Weight Bearing Restrictions No   Balance Screen   Has the patient fallen in the past 6 months Yes   How many times? 2   Has the patient had a decrease in activity level because of a fear of falling?  No   Is the patient reluctant to leave their home because of a fear of falling?  No   Home Ecologist residence   Living Arrangements Spouse/significant other  Available Help at Discharge Available PRN/intermittently   Type of [employer] to enter   Entrance Stairs-Number of Steps 4   Entrance Stairs-Rails Can reach both   Home Layout One level   Prior Function   Level of Independence Independent;Independent with basic ADLs   Vocation On disability   Leisure going to the [employer]   Overall Cognitive Status Within Functional Limits for tasks assessed   Observation/Other Assessments   Focus on Therapeutic Outcomes (FOTO)  47% limited  predicted 32% limited   Posture/Postural Control   Posture/Postural Control Postural limitations   Postural Limitations Rounded Shoulders;Forward head   ROM / Strength   AROM / PROM / Strength PROM;AROM;Strength   AROM   AROM Assessment Site Shoulder   Right/Left Shoulder Right;Left   Right Shoulder Extension 48 Degrees   Right Shoulder  Flexion 82 Degrees  ERP   Right Shoulder ABduction 65 Degrees  ERP   Right Shoulder Internal Rotation 40 Degrees  test with shoulder abducted to 45 degrees, feels better   Right Shoulder External Rotation 5 Degrees  test with shoulder abducted to 45 degrees, stiff/pain   Left Shoulder Extension 60 Degrees   Left Shoulder Flexion 152 Degrees   Left Shoulder ABduction 113 Degrees   Left Shoulder Internal Rotation 65 Degrees   Left Shoulder External Rotation 62 Degrees   PROM   PROM Assessment Site Shoulder   Right/Left Shoulder Right   Right Shoulder Flexion 92 Degrees  ERP   Right Shoulder ABduction 68 Degrees  ERP   Right Shoulder Internal Rotation 42 Degrees  ERP   Right Shoulder External Rotation 6 Degrees  ERp   Strength   Strength Assessment Site Shoulder;Hand   Right/Left Shoulder Right;Left   Right Shoulder Flexion 3/5  pain during testing   Right Shoulder Extension 3/5   Right Shoulder ABduction 3/5  pain during testing   Right Shoulder Internal Rotation 3/5   Right Shoulder External Rotation 4/5   Left Shoulder Flexion 4+/5   Left Shoulder Extension 5/5   Left Shoulder ABduction 4+/5   Left Shoulder Internal Rotation 4+/5   Left Shoulder External Rotation 4+/5   Right Hand Grip (lbs) 56.6  58,54,58   Left Hand Grip (lbs) 67.3  65,68,69   Palpation   Palpation comment tenderness at the supraspinatus, soreness in the infraspinatus.soreness at the greater tubercle / lesser tubercle.     Special Tests    Special Tests Rotator Cuff Impingement   Rotator Cuff Impingment tests Michel Bickers test;Full Can test;Empty Can test   Hawkins-Kennedy test   Findings Positive   Side Right   Empty Can test   Findings Positive   Side Right   Full Can test   Findings Positive   Side Right                           PT Education - 11/18/15 1455    Education provided Yes   Education Details evaluation findings, POC, goals, HEP   Person(s) Educated  Patient   Methods Explanation;Demonstration;Handout;Verbal cues   Comprehension Verbalized understanding;Verbal cues required          PT Short Term Goals - 11/18/15 1514    PT SHORT TERM GOAL #1   Title pt will be I with inital HEP (12/19/2015)   Time 4   Period Weeks   Status New   PT SHORT TERM GOAL #2  Title pt will be to verbalize and demosntrate techniques to reduce R shoulder pain and inflammation via RICE and HEP (12/19/2015)   Time 4   Period Weeks   Status New           PT Long Term Goals - 11/18/15 1515    PT LONG TERM GOAL #1   Title pt will be I with all HEP given as of last visit (01/13/2016)   Time 8   Period Weeks   Status New   PT LONG TERM GOAL #2   Title pt will improve R shoulder flexion / abduction to >/= 110 degrees and ER to >/= 15 degrees to assist with personal grooming, getting dressed and ADLs (01/13/2016)   Time 8   Period Weeks   Status New   PT LONG TERM GOAL #3   Title pt will improve R shoulder strength to >/= 4-/5 in all planes to assist with ADLS ([id number])   Time 8   Period Weeks   Status New   PT LONG TERM GOAL #4   Title foto score to </=32% limited to demonstrate improvement in function at discharge (01/13/2016)   Time 8   Period Weeks   Status New               Plan - 11/18/15 1509    Clinical Impression Statement Mr. Chavana presents as a moderate complexity evaluation with CC of R shoulder pain for 6 months. Limited R shoulder AROM/ PROM secodary to pain and tightness, with weakness secondary to pain in the R shoulder compared bil. soreness noted in the supraspintaus muscle belly with referral to the greater tubercle and pain at the lesser tubercle. special testing is postive and consistent with dx of rotator cuff tear. He would benefit from physical to decrease R shoulder pain, improve mobility, incrase strength and overall maximize his function by addressing the impairments listed.    Rehab Potential Good   PT  Frequency Biweekly   PT Duration 8 weeks   PT Treatment/Interventions ADLs/Self Care Home Management;Cryotherapy;Electrical Stimulation;Iontophoresis 4mg /ml Dexamethasone;Moist Heat;Therapeutic exercise;Therapeutic activities;Manual techniques;Taping;Dry needling;Passive range of motion;Patient/family education;Ultrasound   PT Next Visit Plan assess/ review HEP, shoulder/ scapular mobs, shoulder strengthening, progress HEP to isotonic RC strengthening if tolerated well in clinic   PT Home Exercise Plan wand flexion/ abduction/ ER, rows, Isometric flexion/ extension, IR/ER   Consulted and Agree with Plan of Care Patient      Patient will benefit from skilled therapeutic intervention in order to improve the following deficits and impairments:  Pain, Improper body mechanics, Postural dysfunction, Hypomobility, Decreased strength, Decreased range of motion, Decreased endurance, Decreased activity tolerance, Increased fascial restricitons  Visit Diagnosis: Pain in right shoulder - Plan: PT plan of care cert/re-cert  Stiffness of right shoulder, not elsewhere classified - Plan: PT plan of care cert/re-cert  Muscle weakness (generalized) - Plan: PT plan of care cert/re-cert  Abnormal posture - Plan: PT plan of care cert/re-cert      G-Codes - [id number]    Functional Limitation Carrying, moving and handling objects   Carrying, Moving and Handling Objects Current Status [id number]) At least 60 percent but less than 80 percent impaired, limited or restricted   Carrying, Moving and Handling Objects Goal Status [id number]) At least 20 percent but less than 40 percent impaired, limited or restricted       Problem List Patient Active Problem List   Diagnosis Date Noted  . Right rotator cuff tear 11/10/2015  . Right  shoulder pain 10/03/2015  . Eczema 06/14/2015  . Smoker 05/22/2014  . COPD, severe ([place]) 06/22/2013  . Preventative health care 05/20/2013  . Hyperlipidemia 05/20/2013  . Bladder  neck obstruction 05/20/2013  . Asthma   . Factor 5 Leiden mutation, heterozygous ([place])   . Allergic rhinitis, cause unspecified   . Lumbar disc disease   . Left foot drop   . Chronic lumbar radiculopathy   . Chronic pain   . Postlaminectomy syndrome, lumbar region 12/28/2011   Starr Lake PT, DPT, LAT, ATC  11/18/2015  3:38 PM      [facility] [address] [place], [place], [address] Phone: [phone]   Fax:  [phone]  Name: Cody Grant MRN: [id number] Date of Birth: [birth year]

## 2015-11-29 ENCOUNTER — Ambulatory Visit: Payer: Commercial Managed Care - HMO | Admitting: Physical Therapy

## 2015-11-29 DIAGNOSIS — R293 Abnormal posture: Secondary | ICD-10-CM | POA: Diagnosis not present

## 2015-11-29 DIAGNOSIS — M6281 Muscle weakness (generalized): Secondary | ICD-10-CM | POA: Diagnosis not present

## 2015-11-29 DIAGNOSIS — M25511 Pain in right shoulder: Secondary | ICD-10-CM

## 2015-11-29 DIAGNOSIS — M25611 Stiffness of right shoulder, not elsewhere classified: Secondary | ICD-10-CM | POA: Diagnosis not present

## 2015-11-29 NOTE — Therapy (Signed)
[place], [place], [address] Phone: [phone]   Fax:  [phone]  Physical Therapy Treatment  Patient Details  Name: Cody Grant MRN: [id number] Date of Birth: Apr 25, 1961 Referring Provider: Hulan Saas DO  Encounter Date: 11/29/2015      PT End of Session - 11/29/15 1459    Visit Number 2   Number of Visits 5   Date for PT Re-Evaluation 01/13/16   Authorization Type Medicare: kx mod by 15th visit, progress note by 10th visit   PT Start Time 1415   PT Stop Time 1505   PT Time Calculation (min) 50 min   Activity Tolerance Patient tolerated treatment well   Behavior During Therapy [facility] for tasks assessed/performed      Past Medical History:  Diagnosis Date  . Allergic rhinitis, cause unspecified   . Asthma    as child  . Chronic lumbar radiculopathy    left with left drop foot/numbness  . Chronic pain    to see Dr Delynn Flavin  . COPD, severe ([place]) 06/22/2013  . Factor 5 Leiden mutation, heterozygous ([place])   . Left foot drop   . Lumbar disc disease   . Other and unspecified hyperlipidemia 05/20/2013    Past Surgical History:  Procedure Laterality Date  . SPINE SURGERY  12/2010   micro diskectomy    There were no vitals filed for this visit.      Subjective Assessment - 11/29/15 1416    Subjective "I had a bad flare up of my sciatica but was laid up for a while, the exercises feel like they help when I do it"   Currently in Pain? Yes   Pain Score 5    Pain Location Shoulder   Pain Orientation Right   Pain Descriptors / Indicators Aching;Dull   Pain Type Chronic pain   Pain Onset More than a month ago   Pain Frequency Intermittent   Aggravating Factors  moving the R arm around.    Pain Relieving Factors resting, muscle relaxer                         [facility] Adult PT Treatment/Exercise - 11/29/15 0001      Shoulder Exercises: Pulleys   Flexion 2 minutes   ABduction 2 minutes     Shoulder Exercises: ROM/Strengthening   UBE (Upper Arm Bike) L 1 x 5 min   forward 2.5/ backward 2.5 min     Shoulder Exercises: Stretch   Other Shoulder Stretches R upper trap stretch 2 x 30 sec hold     Modalities   Modalities Moist Heat     Moist Heat Therapy   Number Minutes Moist Heat 10 Minutes   Moist Heat Location Shoulder     Manual Therapy   Manual Therapy Joint mobilization;Scapular mobilization   Joint Mobilization GH mobs grade 2 inferior/ posterior/ anterior with intermittent distraction   Scapular Mobilization Grade 3 mobs in all directions with upward / downward rotation                 PT Education - 11/29/15 1459    Education provided Yes   Education Details HEP review   Person(s) Educated Patient   Methods Explanation;Demonstration   Comprehension Verbalized understanding;Returned demonstration          PT Short Term Goals - 11/18/15 1514      PT SHORT TERM GOAL #1   Title pt will be  I with inital HEP (12/19/2015)   Time 4   Period Weeks   Status New     PT SHORT TERM GOAL #2   Title pt will be to verbalize and demosntrate techniques to reduce R shoulder pain and inflammation via RICE and HEP (12/19/2015)   Time 4   Period Weeks   Status New           PT Long Term Goals - 11/18/15 1515      PT LONG TERM GOAL #1   Title pt will be I with all HEP given as of last visit (01/13/2016)   Time 8   Period Weeks   Status New     PT LONG TERM GOAL #2   Title pt will improve R shoulder flexion / abduction to >/= 110 degrees and ER to >/= 15 degrees to assist with personal grooming, getting dressed and ADLs (01/13/2016)   Time 8   Period Weeks   Status New     PT LONG TERM GOAL #3   Title pt will improve R shoulder strength to >/= 4-/5 in all planes to assist with ADLS ([id number])   Time 8   Period Weeks   Status New     PT LONG TERM GOAL #4   Title foto score to </=32% limited to demonstrate improvement in  function at discharge (01/13/2016)   Time 8   Period Weeks   Status New               Plan - 11/29/15 1500    Clinical Impression Statement Cody Grant reported some relief with his HEP but states he hasn't been as consistent due to sciatic pain. focused on HEP review and ROM using pulleys and manual to calm down muscle guarding and improve mobility. which he reported relief of tension post session. utilized MHP post session to calm down soreness.   PT Treatment/Interventions ADLs/Self Care Home Management;Cryotherapy;Electrical Stimulation;Iontophoresis 4mg /ml Dexamethasone;Moist Heat;Therapeutic exercise;Therapeutic activities;Manual techniques;Taping;Dry needling;Passive range of motion;Patient/family education;Ultrasound   PT Next Visit Plan shoulder/ scapular mobs, shoulder strengthening, progress HEP to isotonic RC strengthening if tolerated well in clinic   Consulted and Agree with Plan of Care Patient      Patient will benefit from skilled therapeutic intervention in order to improve the following deficits and impairments:  Pain, Improper body mechanics, Postural dysfunction, Hypomobility, Decreased strength, Decreased range of motion, Decreased endurance, Decreased activity tolerance, Increased fascial restricitons  Visit Diagnosis: Pain in right shoulder  Stiffness of right shoulder, not elsewhere classified  Muscle weakness (generalized)  Abnormal posture     Problem List Patient Active Problem List   Diagnosis Date Noted  . Right rotator cuff tear 11/10/2015  . Right shoulder pain 10/03/2015  . Eczema 06/14/2015  . Smoker 05/22/2014  . COPD, severe ([place]) 06/22/2013  . Preventative health care 05/20/2013  . Hyperlipidemia 05/20/2013  . Bladder neck obstruction 05/20/2013  . Asthma   . Factor 5 Leiden mutation, heterozygous ([place])   . Allergic rhinitis, cause unspecified   . Lumbar disc disease   . Left foot drop   . Chronic lumbar radiculopathy   .  Chronic pain   . Postlaminectomy syndrome, lumbar region 12/28/2011   Starr Lake PT, DPT, LAT, ATC  11/29/15  3:05 PM      [place] [facility] [address] [place], [place], [address] Phone: [phone]   Fax:  [phone]  Name: Cody Grant MRN: [id number] Date of Birth: 1960-05-19

## 2015-12-07 NOTE — Progress Notes (Signed)
[facility] [place],  [address] Phone: [phone] Subjective:    I'm seeing this patient by the request  of:  Cathlean Cower, MD   CC: Right shoulder pain follow-up  [id number]  Cody Grant is a 55 y.o. male coming in with complaint of right shoulder pain. Past medical history is significant for chronic pain and was on chronic pain medications for quite some time.  Patient was seen previously for right shoulder pain. Had decreased range of motion as well as large rotator cuff tear. Patient elected try formal physical therapy. Has been doing and 2 times a week for the last 4-6 weeks. Patient states that he has made very mild improvement. Feels that the pain is improved but has not increased range of motion or strength. Patient states that it is still affecting daily activities. Can wake him up at night. Pain can be unbearable and he is still awaiting placement in a pain clinic.     Past Medical History:  Diagnosis Date  . Allergic rhinitis, cause unspecified   . Asthma    as child  . Chronic lumbar radiculopathy    left with left drop foot/numbness  . Chronic pain    to see Dr Delynn Flavin  . COPD, severe ([place]) 06/22/2013  . Factor 5 Leiden mutation, heterozygous ([place])   . Left foot drop   . Lumbar disc disease   . Other and unspecified hyperlipidemia 05/20/2013   Past Surgical History:  Procedure Laterality Date  . SPINE SURGERY  12/2010   micro diskectomy   Social History   Social History  . Marital status: Unknown    Spouse name: N/A  . Number of children: N/A  . Years of education: N/A   Occupational History  . Disabled    Social History Main Topics  . Smoking status: Current Every Day Smoker    Packs/day: 1.00    Years: 30.00    Types: Cigarettes  . Smokeless tobacco: Never Used  . Alcohol use 1.2 - 2.4 oz/week    1 - 2 Cans of beer, 1 - 2 Glasses of wine per week  . Drug use: No  . Sexual activity: Not on  file   Other Topics Concern  . Not on file   Social History Narrative  . No narrative on file   No Known Allergies Family History  Problem Relation Age of Onset  . COPD Mother   . Diabetes Mother   . Pulmonary embolism Father     Past medical history, social, surgical and family history all reviewed in electronic medical record.  No pertanent information unless stated regarding to the chief complaint.   Review of Systems: No headache, visual changes, nausea, vomiting, diarrhea, constipation, dizziness, abdominal pain, skin rash, fevers, chills, night sweats, weight loss, swollen lymph nodes, body aches, joint swelling, muscle aches, chest pain, shortness of breath, mood changes.   Objective  There were no vitals taken for this visit.  General: No apparent distress alert and oriented x3 mood and affect normal, dressed appropriately.  HEENT: Pupils equal, extraocular movements intact  Respiratory: Patient's speak in full sentences and does not appear short of breath  Cardiovascular: No lower extremity edema, non tender, no erythema  Skin: Warm dry intact with no signs of infection or rash on extremities or on axial skeleton.  Abdomen: Soft nontender  Neuro: Cranial nerves II through XII are intact, neurovascularly intact in all extremities with 2+ DTRs and 2+  pulses.  Lymph: No lymphadenopathy of posterior or anterior cervical chain or axillae bilaterally.  Gait normal with good balance and coordination.  MSK:  Non tender with full range of motion and good stability and symmetric strength and tone of  elbows, wrist, hip, knee and ankles bilaterally.  Shoulder: Right Inspection reveals no abnormalities, atrophy or asymmetry. Palpation is normal with no tenderness over AC joint or bicipital groove. Limited range of motion lacking the last 10 of external rotation. Internal rotation to sacrum and patient does have limitation of approximately 140 of forward flexion worsen previous  exam Rotator cuff strength +5 which is worse than previous exam signs of impingement with positive Neer and Hawkin's tests, now positive empty can sign. Speeds and Yergason's tests normal. Positive O'Brien's Normal scapular function observed. No painful arc and no drop arm sign. No apprehension sign Contralateral shoulder unremarkable    Impression and Recommendations:     This case required medical decision making of moderate complexity.      Note: This dictation was prepared with Dragon dictation along with smaller phrase technology. Any transcriptional errors that result from this process are unintentional.

## 2015-12-08 ENCOUNTER — Ambulatory Visit (INDEPENDENT_AMBULATORY_CARE_PROVIDER_SITE_OTHER): Payer: Commercial Managed Care - HMO | Admitting: Family Medicine

## 2015-12-08 ENCOUNTER — Encounter: Payer: Self-pay | Admitting: Family Medicine

## 2015-12-08 VITALS — BP 116/78 | HR 99 | Wt 175.0 lb

## 2015-12-08 DIAGNOSIS — M75101 Unspecified rotator cuff tear or rupture of right shoulder, not specified as traumatic: Secondary | ICD-10-CM | POA: Diagnosis not present

## 2015-12-08 DIAGNOSIS — M25511 Pain in right shoulder: Secondary | ICD-10-CM | POA: Diagnosis not present

## 2015-12-08 MED ORDER — GABAPENTIN 100 MG PO CAPS: 200.0000 mg | ORAL_CAPSULE | Freq: Every day | ORAL | 3 refills | Status: DC

## 2015-12-08 NOTE — Assessment & Plan Note (Signed)
I believe the patient does have a full-thickness rotator cuff tear. Question will labral pathology as well. We discussed with patient at great length. I do want him to have advanced imaging. Depending on this knee think he'll need likely surgical intervention. We discussed icing regimen. Patient given gabapentin now the pain. Follow-up again no surgical intervention is necessary.  Spent  25 minutes with patient face-to-face and had greater than 50% of counseling including as described above in assessment and plan.

## 2015-12-08 NOTE — Patient Instructions (Signed)
Good to see you will get the mri I will write you and tell you next step Gabapentin 200mg  at night, if too groggy then consider 100mg  at night

## 2015-12-09 ENCOUNTER — Other Ambulatory Visit: Payer: Self-pay | Admitting: Family Medicine

## 2015-12-09 DIAGNOSIS — T1590XA Foreign body on external eye, part unspecified, unspecified eye, initial encounter: Secondary | ICD-10-CM

## 2015-12-13 ENCOUNTER — Ambulatory Visit: Payer: Commercial Managed Care - HMO | Attending: Internal Medicine | Admitting: Physical Therapy

## 2015-12-13 DIAGNOSIS — R293 Abnormal posture: Secondary | ICD-10-CM | POA: Insufficient documentation

## 2015-12-13 DIAGNOSIS — M25611 Stiffness of right shoulder, not elsewhere classified: Secondary | ICD-10-CM | POA: Diagnosis not present

## 2015-12-13 DIAGNOSIS — M25511 Pain in right shoulder: Secondary | ICD-10-CM | POA: Diagnosis not present

## 2015-12-13 DIAGNOSIS — M6281 Muscle weakness (generalized): Secondary | ICD-10-CM | POA: Insufficient documentation

## 2015-12-13 NOTE — Therapy (Signed)
[facility], [place], [address] Phone: [phone]   Fax:  [phone]  Physical Therapy Treatment  Patient Details  Name: Cody Grant MRN: [id number] Date of Birth: 02/14/1961 Referring Provider: Hulan Saas DO  Encounter Date: 12/13/2015      PT End of Session - 12/13/15 1358    Visit Number 3   Number of Visits 5   Date for PT Re-Evaluation 01/13/16   Authorization Type Medicare: kx mod by 15th visit, progress note by 10th visit   PT Start Time 1320   PT Stop Time 1410   PT Time Calculation (min) 50 min   Activity Tolerance Patient tolerated treatment well   Behavior During Therapy [facility] for tasks assessed/performed      Past Medical History:  Diagnosis Date  . Allergic rhinitis, cause unspecified   . Asthma    as child  . Chronic lumbar radiculopathy    left with left drop foot/numbness  . Chronic pain    to see Dr Delynn Flavin  . COPD, severe ([place]) 06/22/2013  . Factor 5 Leiden mutation, heterozygous ([place])   . Left foot drop   . Lumbar disc disease   . Other and unspecified hyperlipidemia 05/20/2013    Past Surgical History:  Procedure Laterality Date  . SPINE SURGERY  12/2010   micro diskectomy    There were no vitals filed for this visit.      Subjective Assessment - 12/13/15 1322    Subjective "I have been doing the exercises but I am still having issues, plan to get an MRI on the 22nd"    Currently in Pain? Yes   Pain Score 6    Pain Location Shoulder   Pain Orientation Right   Pain Descriptors / Indicators Aching;Sore   Pain Type Chronic pain   Pain Onset More than a month ago   Pain Frequency Intermittent   Aggravating Factors  moving the R arm, using it   Pain Relieving Factors resting, muscle relaxer            [facility] PT Assessment - 12/13/15 0001      PROM   Right Shoulder Flexion 98 Degrees  on pulleys                     [facility] Adult PT  Treatment/Exercise - 12/13/15 1329      Shoulder Exercises: Supine   Protraction AROM;10 reps;Strengthening;Right;Weights   Protraction Weight (lbs) 2     Shoulder Exercises: Sidelying   External Rotation AROM;Strengthening;Right;10 reps  x 2 sets     Shoulder Exercises: Standing   Row AROM;Strengthening;Both;10 reps   Theraband Level (Shoulder Row) Level 2 (Red)   Other Standing Exercises UE ranger flexion/ abduction 2 x 10 each .     Shoulder Exercises: Pulleys   Flexion 3 minutes   ABduction 3 minutes     Shoulder Exercises: ROM/Strengthening   UBE (Upper Arm Bike) L1.5 x 6 min  changing direction at 3 min     Shoulder Exercises: Stretch   Other Shoulder Stretches R upper trap stretch 2 x 30 sec hold     Moist Heat Therapy   Number Minutes Moist Heat 10 Minutes   Moist Heat Location Shoulder     Manual Therapy   Joint Mobilization GH mobs grade 2 inferior/ posterior/ anterior with intermittent distraction   Scapular Mobilization Grade 3 mobs in all directions with upward / downward rotation  PT Education - 12/13/15 1358    Education provided Yes   Education Details Reviewed and updated HEP   Person(s) Educated Patient   Methods Explanation;Demonstration;Handout;Verbal cues   Comprehension Verbalized understanding;Verbal cues required          PT Short Term Goals - 12/13/15 1401      PT SHORT TERM GOAL #1   Title pt will be I with inital HEP (12/19/2015)   Time 4   Period Weeks   Status Achieved     PT SHORT TERM GOAL #2   Title pt will be to verbalize and demosntrate techniques to reduce R shoulder pain and inflammation via RICE and HEP (12/19/2015)   Time 4   Period Weeks   Status Achieved           PT Long Term Goals - 12/13/15 1401      PT LONG TERM GOAL #1   Title pt will be I with all HEP given as of last visit (01/13/2016)   Period Weeks   Status On-going     PT LONG TERM GOAL #2   Title pt will improve R shoulder  flexion / abduction to >/= 110 degrees and ER to >/= 15 degrees to assist with personal grooming, getting dressed and ADLs (01/13/2016)   Time 8   Period Weeks   Status On-going     PT LONG TERM GOAL #3   Title pt will improve R shoulder strength to >/= 4-/5 in all planes to assist with ADLS ([id number])   Time 8   Period Weeks   Status On-going     PT LONG TERM GOAL #4   Title foto score to </=32% limited to demonstrate improvement in function at discharge (01/13/2016)   Time 8   Period Weeks   Status Unable to assess               Plan - 12/13/15 1359    Clinical Impression Statement [name] continues to report pain inthe R shoulder with limited improvement with AROM. pt plans to get an MRI in the next week and depeneding on the results may need surgical consult per pt. focused on scapular mobility and scapular stabilizers which he did good with required cues to stay within his pain free ROM. continues MHP post session for pain.    PT Treatment/Interventions ADLs/Self Care Home Management;Cryotherapy;Electrical Stimulation;Iontophoresis 4mg /ml Dexamethasone;Moist Heat;Therapeutic exercise;Therapeutic activities;Manual techniques;Taping;Dry needling;Passive range of motion;Patient/family education;Ultrasound   PT Next Visit Plan shoulder/ scapular mobs, shoulder strengthening, may or may not return depending on MRI results   PT Home Exercise Plan ceiling punches, sidelying ER   Consulted and Agree with Plan of Care Patient      Patient will benefit from skilled therapeutic intervention in order to improve the following deficits and impairments:     Visit Diagnosis: Pain in right shoulder  Stiffness of right shoulder, not elsewhere classified  Muscle weakness (generalized)  Abnormal posture     Problem List Patient Active Problem List   Diagnosis Date Noted  . Right rotator cuff tear 11/10/2015  . Right shoulder pain 10/03/2015  . Eczema 06/14/2015  . Smoker  05/22/2014  . COPD, severe ([place]) 06/22/2013  . Preventative health care 05/20/2013  . Hyperlipidemia 05/20/2013  . Bladder neck obstruction 05/20/2013  . Asthma   . Factor 5 Leiden mutation, heterozygous ([place])   . Allergic rhinitis, cause unspecified   . Lumbar disc disease   . Left foot drop   . Chronic  lumbar radiculopathy   . Chronic pain   . Postlaminectomy syndrome, lumbar region 12/28/2011   Starr Lake PT, DPT, LAT, ATC  12/13/15  2:10 PM      [facility] [address] [place], [place], [address] Phone: [phone]   Fax:  [phone]  Name: Cody Grant MRN: [id number] Date of Birth: 1960/08/05

## 2015-12-14 ENCOUNTER — Encounter: Payer: Self-pay | Admitting: Internal Medicine

## 2015-12-14 ENCOUNTER — Ambulatory Visit (INDEPENDENT_AMBULATORY_CARE_PROVIDER_SITE_OTHER): Payer: Commercial Managed Care - HMO | Admitting: Internal Medicine

## 2015-12-14 VITALS — BP 124/74 | HR 102 | Temp 98.6°F | Resp 20 | Wt 173.0 lb

## 2015-12-14 DIAGNOSIS — J449 Chronic obstructive pulmonary disease, unspecified: Secondary | ICD-10-CM | POA: Diagnosis not present

## 2015-12-14 DIAGNOSIS — Z0001 Encounter for general adult medical examination with abnormal findings: Secondary | ICD-10-CM

## 2015-12-14 DIAGNOSIS — E785 Hyperlipidemia, unspecified: Secondary | ICD-10-CM

## 2015-12-14 DIAGNOSIS — J309 Allergic rhinitis, unspecified: Secondary | ICD-10-CM

## 2015-12-14 DIAGNOSIS — F17200 Nicotine dependence, unspecified, uncomplicated: Secondary | ICD-10-CM

## 2015-12-14 DIAGNOSIS — Z720 Tobacco use: Secondary | ICD-10-CM

## 2015-12-14 DIAGNOSIS — R6889 Other general symptoms and signs: Secondary | ICD-10-CM

## 2015-12-14 DIAGNOSIS — J4520 Mild intermittent asthma, uncomplicated: Secondary | ICD-10-CM

## 2015-12-14 MED ORDER — ALBUTEROL SULFATE HFA 108 (90 BASE) MCG/ACT IN AERS: INHALATION_SPRAY | RESPIRATORY_TRACT | 11 refills | Status: DC

## 2015-12-14 NOTE — Patient Instructions (Signed)
Please continue all other medications as before, and refills have been done if requested.  Please have the pharmacy call with any other refills you may need.  Please continue your efforts at being more active, low cholesterol diet, and weight control.  You are otherwise up to date with prevention measures today.  Please keep your appointments with your specialists as you may have planned  Please call if you change your mind about the Chantix; please quit smoking

## 2015-12-14 NOTE — Progress Notes (Signed)
Pre visit review using our clinic review tool, if applicable. No additional management support is needed unless otherwise documented below in the visit note. 

## 2015-12-14 NOTE — Assessment & Plan Note (Signed)
Urged to quit, consider chantix 

## 2015-12-14 NOTE — Assessment & Plan Note (Signed)
stable overall by history and exam, recent data reviewed with pt, and pt to continue medical treatment as before,  to f/u any worsening symptoms or concerns   Lasst LDL feb 2017 at 127 - for lower chol diet

## 2015-12-14 NOTE — Assessment & Plan Note (Signed)
Mild to mod, for otc zyrtec prn,  to f/u any worsening symptoms or concerns ?

## 2015-12-14 NOTE — Assessment & Plan Note (Signed)
Stable with mild persistent symptoms, I asked pt to check with pharmacy regarding a medication similar to spiriva that would be covered with his insurance

## 2015-12-14 NOTE — Assessment & Plan Note (Signed)
stable overall by history and exam, recent data reviewed with pt, and pt to continue medical treatment as before,  to f/u any worsening symptoms or concerns @LASTSAO2(3)@  

## 2015-12-14 NOTE — Progress Notes (Signed)
Subjective:    Patient ID: Cody Grant, male    DOB: [birth year], 55 y.o.   MRN: [id number]  HPI  Here to f/u; overall doing ok,  Pt denies chest pain, increasing sob or doe, wheezing, orthopnea, PND, increased LE swelling, palpitations, dizziness or syncope.  Pt denies new neurological symptoms such as new headache, or facial or extremity weakness or numbness.  Pt denies polydipsia, polyuria, or low sugar episode.   Pt denies new neurological symptoms such as new headache, or facial or extremity weakness or numbness.   Pt states overall good compliance with meds, mostly trying to follow appropriate diet, with wt overall stable.  Pt continues to have recurring LBP without change in severity, bowel or bladder change, fever, wt loss,  worsening LE pain/numbness/weakness, gait change or falls.  Does also have right shoulder pain persisting with reduced ROM, for MRI per Dr Tamala Julian to further eval for rot cuff tear.  Pt not quite ready to quit smoking but might consider it, and let me know about whether he wants chatix but is expensive for now.  Also Spiriva not covered by his insurance, overall doing ok but still with sob/doe at last at baseline  Does have several wks ongoing nasal allergy symptoms with clearish congestion, itch and sneezing, without fever, pain, ST, cough, swelling or wheezing. Past Medical History:  Diagnosis Date  . Allergic rhinitis, cause unspecified   . Asthma    as child  . Chronic lumbar radiculopathy    left with left drop foot/numbness  . Chronic pain    to see Dr Delynn Flavin  . COPD, severe ([place]) 06/22/2013  . Factor 5 Leiden mutation, heterozygous ([place])   . Left foot drop   . Lumbar disc disease   . Other and unspecified hyperlipidemia 05/20/2013   Past Surgical History:  Procedure Laterality Date  . SPINE SURGERY  12/2010   micro diskectomy    reports that he has been smoking Cigarettes.  He has a 30.00 pack-year smoking history. He has never used smokeless tobacco. He  reports that he drinks about 1.2 - 2.4 oz of alcohol per week . He reports that he does not use drugs. family history includes COPD in his mother; Diabetes in his mother; Pulmonary embolism in his father. No Known Allergies Current Outpatient Prescriptions on File Prior to Visit  Medication Sig Dispense Refill  . albuterol (ACCUNEB) 0.63 MG/3ML nebulizer solution Take 3 mLs (0.63 mg total) by nebulization every 6 (six) hours as needed for wheezing. 75 mL 12  . diclofenac (FLECTOR) 1.3 % PTCH Place 1 patch onto the skin as needed. 30 patch 2  . fluocinonide cream (LIDEX) [id number] % Apply 1 application topically 2 (two) times daily. 30 g 2  . gabapentin (NEURONTIN) 100 MG capsule Take 2 capsules (200 mg total) by mouth at bedtime. 60 capsule 3  . ibuprofen (ADVIL,MOTRIN) 400 MG tablet Take 1 tablet (400 mg total) by mouth every 8 (eight) hours as needed for pain. 90 tablet 1  . methocarbamol (ROBAXIN) 500 MG tablet Take 500 mg by mouth as needed. Reported on 06/14/2015    . NAPROXEN PO Take 1 tablet by mouth as directed.    Marland Kitchen oxyCODONE (OXY IR/ROXICODONE) 5 MG immediate release tablet Take 1 tablet (5 mg total) by mouth every 8 (eight) hours as needed. May take an extra tablet for severe pain occasionally 100 tablet 0  . sildenafil (VIAGRA) 100 MG tablet Take 0.5-1 tablets (50-100 mg total) by mouth daily  as needed for erectile dysfunction. 5 tablet 11  . tiotropium (SPIRIVA HANDIHALER) 18 MCG inhalation capsule Place 1 capsule (18 mcg total) into inhaler and inhale daily. 30 capsule 12  . tiZANidine (ZANAFLEX) 4 MG tablet TAKE 1 TABLET(4 MG) BY MOUTH EVERY 8 HOURS AS NEEDED FOR MUSCLE SPASMS 90 tablet 0  . triamcinolone cream (KENALOG) 0.1 % Apply 1 application topically 2 (two) times daily. 30 g 0   No current facility-administered medications on file prior to visit.     Review of Systems  Constitutional: Negative for unusual diaphoresis or night sweats HENT: Negative for ear swelling or  discharge Eyes: Negative for worsening visual haziness  Respiratory: Negative for choking and stridor.   Gastrointestinal: Negative for distension or worsening eructation Genitourinary: Negative for retention or change in urine volume.  Musculoskeletal: Negative for other MSK pain or swelling Skin: Negative for color change and worsening wound Neurological: Negative for tremors and numbness other than noted  Psychiatric/Behavioral: Negative for decreased concentration or agitation other than above       Objective:   Physical Exam BP 124/74   Pulse (!) 102   Temp 98.6 F (37 C) (Oral)   Resp 20   Wt 173 lb (78.5 kg)   SpO2 95%   BMI 25.92 kg/m  VS noted,  Constitutional: Pt appears in no apparent distress HENT: Head: NCAT.  Right Ear: External ear normal.  Left Ear: External ear normal.  Eyes: . Pupils are equal, round, and reactive to light. Conjunctivae and EOM are normal Neck: Normal range of motion. Neck supple.  Cardiovascular: Normal rate and regular rhythm.   Pulmonary/Chest: Effort normal and breath sounds decreased without rales or wheezing.  Abd:  Soft, NT, ND, + BS Neurological: Pt is alert. Not confused , motor grossly intact Skin: Skin is warm. No rash, no LE edema Psychiatric: Pt behavior is normal. No agitation.     Assessment & Plan:

## 2015-12-21 ENCOUNTER — Ambulatory Visit
Admission: RE | Admit: 2015-12-21 | Discharge: 2015-12-21 | Disposition: A | Discharge status: Home / Self Care | Payer: Commercial Managed Care - HMO | Source: Ambulatory Visit | Attending: Family Medicine | Admitting: Family Medicine

## 2015-12-21 DIAGNOSIS — M25511 Pain in right shoulder: Secondary | ICD-10-CM | POA: Diagnosis not present

## 2015-12-21 DIAGNOSIS — T1590XA Foreign body on external eye, part unspecified, unspecified eye, initial encounter: Secondary | ICD-10-CM

## 2015-12-27 ENCOUNTER — Ambulatory Visit: Payer: Commercial Managed Care - HMO | Admitting: Physical Therapy

## 2015-12-27 DIAGNOSIS — M6281 Muscle weakness (generalized): Secondary | ICD-10-CM | POA: Diagnosis not present

## 2015-12-27 DIAGNOSIS — M25511 Pain in right shoulder: Secondary | ICD-10-CM | POA: Diagnosis not present

## 2015-12-27 DIAGNOSIS — R293 Abnormal posture: Secondary | ICD-10-CM

## 2015-12-27 DIAGNOSIS — M25611 Stiffness of right shoulder, not elsewhere classified: Secondary | ICD-10-CM | POA: Diagnosis not present

## 2015-12-27 NOTE — Therapy (Signed)
[place], [place], [address] Phone: [phone]   Fax:  [phone]  Physical Therapy Treatment / Re -certification  Patient Details  Name: Cody Grant MRN: [id number] Date of Birth: 04/14/1961 Referring Provider: Hulan Saas DO  Encounter Date: 12/27/2015      PT End of Session - 12/27/15 1337    Visit Number 4   Number of Visits 17   Date for PT Re-Evaluation 02/07/16   Authorization Type Medicare: kx mod by 15th visit, progress note by 10th visit   PT Start Time 1337   PT Stop Time 1419   PT Time Calculation (min) 42 min   Activity Tolerance Patient tolerated treatment well   Behavior During Therapy [facility] for tasks assessed/performed      Past Medical History:  Diagnosis Date  . Allergic rhinitis, cause unspecified   . Asthma    as child  . Chronic lumbar radiculopathy    left with left drop foot/numbness  . Chronic pain    to see Dr Delynn Flavin  . COPD, severe ([place]) 06/22/2013  . Factor 5 Leiden mutation, heterozygous ([place])   . Left foot drop   . Lumbar disc disease   . Other and unspecified hyperlipidemia 05/20/2013    Past Surgical History:  Procedure Laterality Date  . SPINE SURGERY  12/2010   micro diskectomy    There were no vitals filed for this visit.      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 Yes   Pain Score 4    Pain Location Shoulder   Pain Orientation Right   Pain Descriptors / Indicators Aching   Pain Type Chronic pain   Pain Onset More than a month ago   Pain Frequency Intermittent   Aggravating Factors  pulling the arm back with tightness in the front of the shoulder   Pain Relieving Factors resting, muscle relaxer, heat,             [facility] PT Assessment - 12/27/15 1344      AROM   Right Shoulder Extension 50 Degrees   Right Shoulder Flexion 101 Degrees   Right Shoulder ABduction 72 Degrees   Right Shoulder Internal Rotation 50 Degrees   Right Shoulder External Rotation 12 Degrees     Strength   Right Shoulder Flexion 3+/5  in available ROM   Right Shoulder Extension 3+/5  in available ROM   Right Shoulder ABduction 3+/5  in available ROM   Right Shoulder Internal Rotation 3+/5  in available ROM   Right Shoulder External Rotation 3+/5  in available ROM   Right Hand Grip (lbs) 61.3  62,61,61                     [facility] Adult PT Treatment/Exercise - 12/27/15 1355      Shoulder Exercises: Pulleys   Flexion 3 minutes   ABduction 3 minutes     Shoulder Exercises: ROM/Strengthening   UBE (Upper Arm Bike) L1 x 8 min  changing direction at 4 min      Shoulder Exercises: Stretch   Corner Stretch 2 reps;30 seconds   Other Shoulder Stretches sleeper stretch 2 x 30   sidelying     Modalities   Modalities Ultrasound;Iontophoresis     Ultrasound   Ultrasound Location r shoulder   Ultrasound Parameters 100%, 73mx at 1.0  w/cm2 x 8 min   Ultrasound Goals Pain     Iontophoresis   Type of Iontophoresis Dexamethasone   Location R shoulder   Dose 1 cc   Time 6 hour stat patch     Manual Therapy   Joint Mobilization GH mobs grade 3 inferior/ posterior/ anterior with intermittent distraction   Scapular Mobilization Grade 3 mobs in all directions with upward / downward rotation                 PT Education - 12/27/15 1757    Education provided Yes   Education Details reviewed HEP, Iontophoresis benefits and aftercare.    Person(s) Educated Patient   Methods Explanation;Verbal cues;Handout   Comprehension Verbalized understanding;Verbal cues required          PT Short Term Goals - 12/27/15 1358      PT SHORT TERM GOAL #1   Title pt will be I with inital HEP (12/19/2015)   Time 4   Period Weeks   Status Achieved     PT SHORT TERM GOAL #2   Title pt will be to verbalize and demosntrate techniques to  reduce R shoulder pain and inflammation via RICE and HEP (12/19/2015)   Time 4   Period Weeks   Status Achieved           PT Long Term Goals - 12/27/15 1358      PT LONG TERM GOAL #1   Title pt will be I with all HEP given as of last visit (01/13/2016)   Time 8   Period Weeks   Status On-going     PT LONG TERM GOAL #2   Title pt will improve R shoulder flexion / abduction to >/= 110 degrees and ER to >/= 15 degrees to assist with personal grooming, getting dressed and ADLs (01/13/2016)   Time 8   Period Weeks   Status Partially Met     PT LONG TERM GOAL #3   Title pt will improve R shoulder strength to >/= 4-/5 in all planes to assist with ADLS (11/04/2374)   Time 8   Period Weeks   Status On-going     PT LONG TERM GOAL #4   Title foto score to </=32% limited to demonstrate improvement in function at discharge (01/13/2016)   Time 8   Period Weeks   Status Unable to assess               Plan - 12/27/15 1758    Clinical Impression Statement Mr. 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  AROM results demonstrate improving mobility compared to previous measurements, and improved grip strength.  focused on stretching and mobitlity of the shoulder which pt reported some sorness with. Trialed Ionto today to reduce inflammation. Pt is progressing well with goals, plan to conitnue with current POC to work toward remaining goals and independent exercise.    Rehab Potential Good   PT Frequency 2x / week   PT Duration 6 weeks   PT Treatment/Interventions ADLs/Self Care Home Management;Cryotherapy;Electrical Stimulation;Iontophoresis 61m/ml Dexamethasone;Moist Heat;Therapeutic exercise;Therapeutic activities;Manual techniques;Taping;Dry needling;Passive range of motion;Patient/family education;Ultrasound   PT Next Visit Plan assess response to ionto, shoulder/ scapular mobs, shoulder strengthening, AAROM/ AROM, stretching, modalites PRN, update  HEP   PT Home Exercise Plan continue   Consulted and Agree with Plan of Care Patient      Patient will benefit from skilled therapeutic intervention in order to improve the following deficits  and impairments:  Pain, Improper body mechanics, Postural dysfunction, Hypomobility, Decreased strength, Decreased range of motion, Decreased endurance, Decreased activity tolerance, Increased fascial restricitons  Visit Diagnosis: Pain in right shoulder - Plan: PT plan of care cert/re-cert  Stiffness of right shoulder, not elsewhere classified - Plan: PT plan of care cert/re-cert  Muscle weakness (generalized) - Plan: PT plan of care cert/re-cert  Abnormal posture - Plan: PT plan of care cert/re-cert     Problem List Patient Active Problem List   Diagnosis Date Noted  . Right rotator cuff tear 11/10/2015  . Right shoulder pain 10/03/2015  . Eczema 06/14/2015  . Smoker 05/22/2014  . COPD, severe ([place]) 06/22/2013  . Preventative health care 05/20/2013  . Hyperlipidemia 05/20/2013  . Asthma   . Factor 5 Leiden mutation, heterozygous ([place])   . Allergic rhinitis   . Lumbar disc disease   . Left foot drop   . Chronic lumbar radiculopathy   . Chronic pain   . Postlaminectomy syndrome, lumbar region 12/28/2011   Starr Lake PT, DPT, LAT, ATC  12/27/15  6:05 PM      [facility] [address] [place], [place], [address] Phone: [phone]   Fax:  [phone]  Name: Jahfari Ambers MRN: [id number] Date of Birth: 12/28/1960

## 2016-01-06 ENCOUNTER — Other Ambulatory Visit: Payer: Self-pay | Admitting: Family Medicine

## 2016-01-10 ENCOUNTER — Ambulatory Visit: Payer: Commercial Managed Care - HMO

## 2016-01-10 NOTE — Telephone Encounter (Signed)
Refill done.  

## 2016-01-17 ENCOUNTER — Ambulatory Visit: Payer: Commercial Managed Care - HMO | Attending: Internal Medicine | Admitting: Physical Therapy

## 2016-01-17 DIAGNOSIS — M25511 Pain in right shoulder: Secondary | ICD-10-CM

## 2016-01-17 DIAGNOSIS — R293 Abnormal posture: Secondary | ICD-10-CM

## 2016-01-17 DIAGNOSIS — M25611 Stiffness of right shoulder, not elsewhere classified: Secondary | ICD-10-CM | POA: Diagnosis not present

## 2016-01-17 DIAGNOSIS — M6281 Muscle weakness (generalized): Secondary | ICD-10-CM | POA: Diagnosis not present

## 2016-01-17 NOTE — Therapy (Signed)
[place], [place], [address] Phone: [phone]   Fax:  [phone]  Physical Therapy Treatment  Patient Details  Name: Cody Grant MRN: [id number] Date of Birth: 1960-05-12 Referring Provider: Hulan Saas DO  Encounter Date: 01/17/2016      PT End of Session - 01/17/16 1245    Visit Number 5   Number of Visits 17   Date for PT Re-Evaluation 02/07/16   Authorization Type Medicare: kx mod by 15th visit, progress note by 10th visit   PT Start Time 1240   PT Stop Time 1320   PT Time Calculation (min) 40 min      Past Medical History:  Diagnosis Date  . Allergic rhinitis, cause unspecified   . Asthma    as child  . Chronic lumbar radiculopathy    left with left drop foot/numbness  . Chronic pain    to see Dr Delynn Flavin  . COPD, severe ([place]) 06/22/2013  . Factor 5 Leiden mutation, heterozygous ([place])   . Left foot drop   . Lumbar disc disease   . Other and unspecified hyperlipidemia 05/20/2013    Past Surgical History:  Procedure Laterality Date  . SPINE SURGERY  12/2010   micro diskectomy    There were no vitals filed for this visit.      Subjective Assessment - 01/17/16 1242    Subjective I feel like I can reach higher. i dont think the ultrasound or patch helped.    Currently in Pain? Yes   Pain Score 6    Pain Location Shoulder   Pain Orientation Right   Pain Descriptors / Indicators Aching   Aggravating Factors  stretching   Pain Relieving Factors resting            [facility] PT Assessment - 01/17/16 0001      AROM   Right Shoulder Flexion 115 Degrees   Right Shoulder ABduction 101 Degrees     PROM   Right Shoulder Flexion 130 Degrees   Right Shoulder External Rotation 28 Degrees                     [facility] Adult PT Treatment/Exercise - 01/17/16 0001      Shoulder Exercises: Standing   External Rotation Strengthening;Both;10 reps;Theraband  3 sets   Theraband Level (Shoulder External Rotation) Level 1 (Yellow)   Row AROM;Strengthening;Both;10 reps   Theraband Level (Shoulder Row) Level 3 (Green)     Shoulder Exercises: Pulleys   Flexion 3 minutes   ABduction 3 minutes     Shoulder Exercises: ROM/Strengthening   UBE (Upper Arm Bike) L2 x 8 min  changing direction at 4 min    Other ROM/Strengthening Exercises wall slides x 3      Shoulder Exercises: Stretch   External Rotation Stretch 3 reps;30 seconds  at doorway   Wall Stretch - Flexion 3 reps;10 seconds     Manual Therapy   Manual Therapy Passive ROM   Joint Mobilization GH mobs grade 3 inferior/ posterior/ anterior with intermittent distraction   Passive ROM flexion, abduction, scaption, ER , with contract relax on ER                PT Education - 01/17/16 1323    Education provided Yes   Education Details HEP    Person(s) Educated Patient   Methods Explanation;Handout   Comprehension Verbalized understanding          PT Short Term Goals -  12/27/15 1358      PT SHORT TERM GOAL #1   Title pt will be I with inital HEP (12/19/2015)   Time 4   Period Weeks   Status Achieved     PT SHORT TERM GOAL #2   Title pt will be to verbalize and demosntrate techniques to reduce R shoulder pain and inflammation via RICE and HEP (12/19/2015)   Time 4   Period Weeks   Status Achieved           PT Long Term Goals - 12/27/15 1358      PT LONG TERM GOAL #1   Title pt will be I with all HEP given as of last visit (01/13/2016)   Time 8   Period Weeks   Status On-going     PT LONG TERM GOAL #2   Title pt will improve R shoulder flexion / abduction to >/= 110 degrees and ER to >/= 15 degrees to assist with personal grooming, getting dressed and ADLs (01/13/2016)   Time 8   Period Weeks   Status Partially Met     PT LONG TERM GOAL #3   Title pt will improve R shoulder strength to >/= 4-/5 in all planes to assist with ADLS (01/13/2016)   Time 8   Period Weeks    Status On-going     PT LONG TERM GOAL #4   Title foto score to </=32% limited to demonstrate improvement in function at discharge (01/13/2016)   Time 8   Period Weeks   Status Unable to assess               Plan - 01/17/16 1340    Clinical Impression Statement Pt demonstrates improved AROM/PROM in most planes. Spent time on mobs and stretching as well as updating HEP to include ER stretch and strengthen with yellow band. Pt requires cues to decrease shoulde hike throughout sesson. LTG#2 partially Met.    PT Next Visit Plan  shoulder/ scapular mobs, shoulder strengthening, AAROM/ AROM, stretching, modalites PRN, update HEP   PT Home Exercise Plan continue, added ER stretch and yellow band ER      Patient will benefit from skilled therapeutic intervention in order to improve the following deficits and impairments:  Pain, Improper body mechanics, Postural dysfunction, Hypomobility, Decreased strength, Decreased range of motion, Decreased endurance, Decreased activity tolerance, Increased fascial restricitons  Visit Diagnosis: Pain in right shoulder  Stiffness of right shoulder, not elsewhere classified  Muscle weakness (generalized)  Abnormal posture     Problem List Patient Active Problem List   Diagnosis Date Noted  . Right rotator cuff tear 11/10/2015  . Right shoulder pain 10/03/2015  . Eczema 06/14/2015  . Smoker 05/22/2014  . COPD, severe ([facility]) 06/22/2013  . Preventative health care 05/20/2013  . Hyperlipidemia 05/20/2013  . Asthma   . Factor 5 Leiden mutation, heterozygous ([facility])   . Allergic rhinitis   . Lumbar disc disease   . Left foot drop   . Chronic lumbar radiculopathy   . Chronic pain   . Postlaminectomy syndrome, lumbar region 12/28/2011    ,  [place], PTA 01/17/2016, 1:43 PM  [facility] [address] [place], [place], [address] Phone: [phone]   Fax:  [phone]  Name:  Cody Grant MRN: [id number] Date of Birth: 10/14/1960    

## 2016-01-24 ENCOUNTER — Ambulatory Visit: Payer: Commercial Managed Care - HMO | Admitting: Physical Therapy

## 2016-01-24 DIAGNOSIS — M6281 Muscle weakness (generalized): Secondary | ICD-10-CM | POA: Diagnosis not present

## 2016-01-24 DIAGNOSIS — M25611 Stiffness of right shoulder, not elsewhere classified: Secondary | ICD-10-CM | POA: Diagnosis not present

## 2016-01-24 DIAGNOSIS — R293 Abnormal posture: Secondary | ICD-10-CM

## 2016-01-24 DIAGNOSIS — M25511 Pain in right shoulder: Secondary | ICD-10-CM | POA: Diagnosis not present

## 2016-01-24 NOTE — Therapy (Signed)
[place], [place], [address] Phone: [phone]   Fax:  [phone]  Physical Therapy Treatment  Patient Details  Name: Cody Grant MRN: [id number] Date of Birth: 16-Apr-1961 Referring Provider: Hulan Saas DO  Encounter Date: 01/24/2016      PT End of Session - 01/24/16 1324    Visit Number 6   Number of Visits 17   Date for PT Re-Evaluation 02/07/16   Authorization Type Medicare: kx mod by 15th visit, progress note by 10th visit   PT Start Time 1328   PT Stop Time 1416   PT Time Calculation (min) 48 min   Activity Tolerance Patient tolerated treatment well   Behavior During Therapy [facility] for tasks assessed/performed      Past Medical History:  Diagnosis Date  . Allergic rhinitis, cause unspecified   . Asthma    as child  . Chronic lumbar radiculopathy    left with left drop foot/numbness  . Chronic pain    to see Dr Delynn Flavin  . COPD, severe ([place]) 06/22/2013  . Factor 5 Leiden mutation, heterozygous ([place])   . Left foot drop   . Lumbar disc disease   . Other and unspecified hyperlipidemia 05/20/2013    Past Surgical History:  Procedure Laterality Date  . SPINE SURGERY  12/2010   micro diskectomy    There were no vitals filed for this visit.      Subjective Assessment - 01/24/16 1331    Subjective "I am feeling better, still having trouble reaching forward and worse to the side"    Currently in Pain? Yes   Pain Score 5    Pain Location Shoulder   Pain Orientation Right   Pain Descriptors / Indicators Aching   Pain Type Chronic pain   Pain Onset More than a month ago   Pain Frequency Intermittent   Aggravating Factors  reaching arm to the side, forward.    Pain Relieving Factors resting            [facility] PT Assessment - 01/24/16 0001      AROM   Right Shoulder Flexion 115 Degrees   Right Shoulder ABduction 113 Degrees                     [facility] Adult PT  Treatment/Exercise - 01/24/16 1330      Shoulder Exercises: Standing   Row AROM;Strengthening;Both;15 reps   Theraband Level (Shoulder Row) Level 3 (Green)     Shoulder Exercises: Pulleys   Flexion 2 minutes   Flexion Limitations 112 degrees   ABduction 2 minutes     Shoulder Exercises: ROM/Strengthening   UBE (Upper Arm Bike) L2.5 x 8 min   changing direction at 4 min   Other ROM/Strengthening Exercises behind back IR with towel 1 x 10 holding at end range for 10 sec     Shoulder Exercises: Stretch   Other Shoulder Stretches sleeper stretch 2 x 30, Rhomboid stretch 2 x 30 sec hold     Manual Therapy   Scapular Mobilization upward rotation with 3 x 10 flexion/ 3 x 10 abduction reachin in to cabinests at varrying heights   Passive ROM  abduction, scaption, ER , with contract relax on ER, and gentle ossicaltions                PT Education - 01/24/16 1404    Education provided Yes   Education Details updated and reviewed HEP with proper form  and treatment rationale   Person(s) Educated Patient   Methods Explanation;Verbal cues   Comprehension Verbalized understanding;Verbal cues required          PT Short Term Goals - 12/27/15 1358      PT SHORT TERM GOAL #1   Title pt will be I with inital HEP (12/19/2015)   Time 4   Period Weeks   Status Achieved     PT SHORT TERM GOAL #2   Title pt will be to verbalize and demosntrate techniques to reduce R shoulder pain and inflammation via RICE and HEP (12/19/2015)   Time 4   Period Weeks   Status Achieved           PT Long Term Goals - 12/27/15 1358      PT LONG TERM GOAL #1   Title pt will be I with all HEP given as of last visit (01/13/2016)   Time 8   Period Weeks   Status On-going     PT LONG TERM GOAL #2   Title pt will improve R shoulder flexion / abduction to >/= 110 degrees and ER to >/= 15 degrees to assist with personal grooming, getting dressed and ADLs (01/13/2016)   Time 8   Period Weeks   Status  Partially Met     PT LONG TERM GOAL #3   Title pt will improve R shoulder strength to >/= 4-/5 in all planes to assist with ADLS (7/35/3299)   Time 8   Period Weeks   Status On-going     PT LONG TERM GOAL #4   Title foto score to </=32% limited to demonstrate improvement in function at discharge (01/13/2016)   Time 8   Period Weeks   Status Unable to assess               Plan - 01/24/16 1423    Clinical Impression Statement pt conitnues to proress with AAROM/AROM. Focused on PROM with increase abduction with oscillations to increase mobility with reduced pain and AROM with flexion/ abduction with pt actively reaching to work on scapular upward motion to address scapulohumeral rhythm. post session pt declined modalities.    PT Treatment/Interventions ADLs/Self Care Home Management;Cryotherapy;Electrical Stimulation;Iontophoresis '4mg'$ /ml Dexamethasone;Moist Heat;Therapeutic exercise;Therapeutic activities;Manual techniques;Taping;Dry needling;Passive range of motion;Patient/family education;Ultrasound   PT Next Visit Plan  shoulder/ scapular mobs, shoulder strengthening, AAROM/ AROM, stretching, modalites PRN, update HEP   Consulted and Agree with Plan of Care Patient      Patient will benefit from skilled therapeutic intervention in order to improve the following deficits and impairments:  Pain, Improper body mechanics, Postural dysfunction, Hypomobility, Decreased strength, Decreased range of motion, Decreased endurance, Decreased activity tolerance, Increased fascial restricitons  Visit Diagnosis: Pain in right shoulder  Stiffness of right shoulder, not elsewhere classified  Muscle weakness (generalized)  Abnormal posture     Problem List Patient Active Problem List   Diagnosis Date Noted  . Right rotator cuff tear 11/10/2015  . Right shoulder pain 10/03/2015  . Eczema 06/14/2015  . Smoker 05/22/2014  . COPD, severe ([place]) 06/22/2013  . Preventative health care  05/20/2013  . Hyperlipidemia 05/20/2013  . Asthma   . Factor 5 Leiden mutation, heterozygous ([place])   . Allergic rhinitis   . Lumbar disc disease   . Left foot drop   . Chronic lumbar radiculopathy   . Chronic pain   . Postlaminectomy syndrome, lumbar region 12/28/2011   Starr Lake PT, DPT, LAT, ATC  01/24/16  2:46 PM  [place], [place], [address] Phone: [phone]   Fax:  [phone]  Name: Cody Grant MRN: [id number] Date of Birth: Sep 02, 1960

## 2016-02-06 ENCOUNTER — Other Ambulatory Visit: Payer: Self-pay | Admitting: Family Medicine

## 2016-02-07 ENCOUNTER — Ambulatory Visit: Payer: Commercial Managed Care - HMO | Attending: Internal Medicine | Admitting: Physical Therapy

## 2016-02-07 DIAGNOSIS — M25611 Stiffness of right shoulder, not elsewhere classified: Secondary | ICD-10-CM | POA: Insufficient documentation

## 2016-02-07 DIAGNOSIS — M25511 Pain in right shoulder: Secondary | ICD-10-CM | POA: Insufficient documentation

## 2016-02-07 DIAGNOSIS — R293 Abnormal posture: Secondary | ICD-10-CM | POA: Diagnosis not present

## 2016-02-07 DIAGNOSIS — M6281 Muscle weakness (generalized): Secondary | ICD-10-CM | POA: Diagnosis not present

## 2016-02-07 DIAGNOSIS — G8929 Other chronic pain: Secondary | ICD-10-CM | POA: Diagnosis not present

## 2016-02-07 NOTE — Telephone Encounter (Signed)
Refill done.  

## 2016-02-07 NOTE — Therapy (Signed)
[place], [place], [address] Phone: [phone]   Fax:  [phone]  Physical Therapy Treatment / Re-certification  Patient Details  Name: Cody Grant MRN: [id number] Date of Birth: [birth year] Referring Provider: Hulan Saas DO  Encounter Date: 02/07/2016      PT End of Session - 02/07/16 1412    Visit Number 7   Number of Visits 17   Date for PT Re-Evaluation 03/20/16   Authorization Type Medicare: kx mod by 15th visit, progress note by 10th visit   PT Start Time 1330   PT Stop Time 1415   PT Time Calculation (min) 45 min   Activity Tolerance Patient tolerated treatment well   Behavior During Therapy [facility] for tasks assessed/performed      Past Medical History:  Diagnosis Date  . Allergic rhinitis, cause unspecified   . Asthma    as child  . Chronic lumbar radiculopathy    left with left drop foot/numbness  . Chronic pain    to see Dr Delynn Flavin  . 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 Factor 5 Leiden mutation, heterozygous ([place])   . Left foot drop   . Lumbar disc disease   . Other and unspecified hyperlipidemia 05/20/2013    Past Surgical History:  Procedure Laterality Date  . SPINE SURGERY  12/2010   micro diskectomy    There were no vitals filed for this visit.      Subjective Assessment - 02/07/16 1332    Subjective "doing better today, I've been doing more stretching than anything, reaching up"    Currently in Pain? Yes   Pain Score 4    Pain Orientation Right   Pain Descriptors / Indicators Aching   Pain Type Chronic pain   Pain Onset More than a month ago   Pain Frequency Intermittent   Aggravating Factors  hand behind the back, pulling back   Pain Relieving Factors resting            [facility] PT Assessment - 02/07/16 1336      AROM   Right Shoulder Extension 62 Degrees   Right Shoulder Flexion 125 Degrees   Right Shoulder ABduction 85 Degrees   Right Shoulder Internal  Rotation --  t11   Right Shoulder External Rotation 60 Degrees   Left Shoulder Extension --  tested at 85/90     Strength   Right Shoulder Flexion 4-/5   Right Shoulder Extension 4-/5   Right Shoulder ABduction 4-/5   Right Shoulder Internal Rotation 4/5   Right Shoulder External Rotation 4/5   Right Hand Grip (lbs) 68.3  72,68,65                     [facility] Adult PT Treatment/Exercise - 02/07/16 0001      Shoulder Exercises: ROM/Strengthening   UBE (Upper Arm Bike) L2 x 10 min  changing at 5 min     Shoulder Exercises: Stretch   Corner Stretch 2 reps;30 seconds   Other Shoulder Stretches sleeper stretch 2 x 30, Rhomboid stretch 2 x 30 sec hold     Manual Therapy   Joint Mobilization GH mobs grade 3 inferior/ posterior/ anterior with intermittent distraction   Scapular Mobilization upward rotation with 3 x 10 flexion/ 3 x 10 abduction reachin in to cabinests at varrying heights   Passive ROM  abduction, scaption, ER , with contract relax on ER, and gentle ossicaltions  PT Short Term Goals - Feb 11, 2016 1417      PT SHORT TERM GOAL #1   Title pt will be I with inital HEP (12/19/2015)   Time 4   Period Weeks   Status Achieved     PT SHORT TERM GOAL #2   Title pt will be to verbalize and demosntrate techniques to reduce R shoulder pain and inflammation via RICE and HEP (12/19/2015)   Time 4   Period Weeks   Status Achieved           PT Long Term Goals - Feb 11, 2016 1417      PT LONG TERM GOAL #1   Title pt will be I with all HEP given as of last visit (01/13/2016)   Time 8   Period Weeks   Status On-going     PT LONG TERM GOAL #2   Title pt will improve R shoulder flexion / abduction to >/= 110 degrees and ER to >/= 15 degrees to assist with personal grooming, getting dressed and ADLs (01/13/2016)   Time 8   Period Weeks   Status Partially Met     PT LONG TERM GOAL #3   Title pt will improve R shoulder strength to >/= 4-/5 in  all planes to assist with ADLS (12/23/2351)   Time 8   Period Weeks   Status On-going     PT LONG TERM GOAL #4   Title foto score to </=32% limited to demonstrate improvement in function at discharge (01/13/2016)   Time 8   Period Weeks   Status Unable to assess               Plan - 2016-02-11 1419    Clinical Impression Statement Mr. Zeidman continues to make progress with improved mobility and decreased pain. focused on manual to work on IR mobility, pec stretching and inferior mobs for abduction based on pt limations. plan to cotinue with current POC for the next 2-3 visits to work on remaining goals and work toward improved function and discharge.    Rehab Potential Good   PT Frequency Biweekly   PT Duration 4 weeks   PT Treatment/Interventions ADLs/Self Care Home Management;Cryotherapy;Electrical Stimulation;Iontophoresis '4mg'$ /ml Dexamethasone;Moist Heat;Therapeutic exercise;Therapeutic activities;Manual techniques;Taping;Dry needling;Passive range of motion;Patient/family education;Ultrasound   PT Next Visit Plan  shoulder/ scapular mobs, shoulder strengthening, AAROM/ AROM, stretching, modalites PRN, update HEP   PT Home Exercise Plan inerior mobs, distraction mobs, pec stretch   Consulted and Agree with Plan of Care Patient      Patient will benefit from skilled therapeutic intervention in order to improve the following deficits and impairments:  Pain, Improper body mechanics, Postural dysfunction, Hypomobility, Decreased strength, Decreased range of motion, Decreased endurance, Decreased activity tolerance, Increased fascial restricitons  Visit Diagnosis: Chronic right shoulder pain  Stiffness of right shoulder, not elsewhere classified  Muscle weakness (generalized)  Abnormal posture       G-Codes - 02/11/16 1421    Functional Assessment Tool Used clinical judgement   Functional Limitation Carrying, moving and handling objects   Carrying, Moving and Handling  Objects Current Status (I1443) At least 40 percent but less than 60 percent impaired, limited or restricted   Carrying, Moving and Handling Objects Goal Status (X5400) At least 20 percent but less than 40 percent impaired, limited or restricted      Problem List Patient Active Problem List   Diagnosis Date Noted  . Right rotator cuff tear 11/10/2015  . Right shoulder pain 10/03/2015  .  Eczema 06/14/2015  . Smoker 05/22/2014  . COPD, severe ([place]) 06/22/2013  . Preventative health care 05/20/2013  . Hyperlipidemia 05/20/2013  . Asthma   . Factor 5 Leiden mutation, heterozygous ([place])   . Allergic rhinitis   . Lumbar disc disease   . Left foot drop   . Chronic lumbar radiculopathy   . Chronic pain   . Postlaminectomy syndrome, lumbar region 12/28/2011   Starr Lake PT, DPT, LAT, ATC  02/07/16  2:27 PM      [place], [place], [address] Phone: [phone]   Fax:  [phone]  Name: Azel Gumina MRN: [id number] Date of Birth: [birth year]

## 2016-02-21 ENCOUNTER — Ambulatory Visit: Payer: Commercial Managed Care - HMO | Admitting: Physical Therapy

## 2016-02-21 DIAGNOSIS — R293 Abnormal posture: Secondary | ICD-10-CM

## 2016-02-21 DIAGNOSIS — M6281 Muscle weakness (generalized): Secondary | ICD-10-CM

## 2016-02-21 DIAGNOSIS — M25511 Pain in right shoulder: Secondary | ICD-10-CM | POA: Diagnosis not present

## 2016-02-21 DIAGNOSIS — G8929 Other chronic pain: Secondary | ICD-10-CM | POA: Diagnosis not present

## 2016-02-21 DIAGNOSIS — M25611 Stiffness of right shoulder, not elsewhere classified: Secondary | ICD-10-CM

## 2016-02-21 NOTE — Therapy (Signed)
[facility] [address] [place], [place], [address] Phone: [phone]   Fax:  [phone]  Physical Therapy Treatment  Patient Details  Name: Aime Meloche MRN: [id number] Date of Birth: 13-Mar-1961 Referring Provider: Antoine Primas DO  Encounter Date: 02/21/2016      PT End of Session - 02/21/16 1413    Visit Number 8   Number of Visits 17   Date for PT Re-Evaluation 03/20/16   Authorization Type Medicare: kx mod by 15th visit, progress note by 10th visit   PT Start Time 1330   PT Stop Time 1420   PT Time Calculation (min) 50 min   Activity Tolerance Patient tolerated treatment well   Behavior During Therapy [facility] for tasks assessed/performed      Past Medical History:  Diagnosis Date  . Allergic rhinitis, cause unspecified   . Asthma    as child  . Chronic lumbar radiculopathy    left with left drop foot/numbness  . Chronic pain    to see Dr Penni Homans  . COPD, severe ([facility]) 06/22/2013  . Factor 5 Leiden mutation, heterozygous ([facility])   . Left foot drop   . Lumbar disc disease   . Other and unspecified hyperlipidemia 05/20/2013    Past Surgical History:  Procedure Laterality Date  . SPINE SURGERY  12/2010   micro diskectomy    There were no vitals filed for this visit.      Subjective Assessment - 02/21/16 1330    Subjective "not doing too bad"    Currently in Pain? Yes   Pain Score 3    Pain Location Shoulder   Pain Orientation Right   Pain Descriptors / Indicators Aching   Pain Type Chronic pain   Pain Onset More than a month ago   Pain Frequency Intermittent   Aggravating Factors  reaching certain ways/ pulling or pushing   Pain Relieving Factors resting, MHP,                         [facility] Adult PT Treatment/Exercise - 02/21/16 0001      Shoulder Exercises: Standing   Row AROM;Strengthening;Both;15 reps   Theraband Level (Shoulder Row) Level 4 (Blue)   Other Standing Exercises  wall push-ups 2 x 10, 1 x 10 from counter     Shoulder Exercises: ROM/Strengthening   Other ROM/Strengthening Exercises rows on machines 1 x 10 20#, 1 x 10 25#, 1 x 10 35#,      Moist Heat Therapy   Number Minutes Moist Heat 10 Minutes   Moist Heat Location Shoulder     Manual Therapy   Joint Mobilization GH mobs grade 3 inferior/ posterior/ anterior with intermittent distraction   Scapular Mobilization upward rotation with 3 x 10 flexion/ 3 x 10 abduction reachin in to cabinests at varrying heights   Passive ROM  abduction, scaption, ER , with contract relax on ER, and gentle ossicaltions                PT Education - 02/21/16 1411    Education provided Yes   Education Details updated HEP for with proper form and treatment rationale   Person(s) Educated Patient   Methods Explanation;Verbal cues;Handout   Comprehension Verbalized understanding;Verbal cues required          PT Short Term Goals - 02/07/16 1417      PT SHORT TERM GOAL #1   Title pt will be I with inital HEP (12/19/2015)  Time 4   Period Weeks   Status Achieved     PT SHORT TERM GOAL #2   Title pt will be to verbalize and demosntrate techniques to reduce R shoulder pain and inflammation via RICE and HEP (12/19/2015)   Time 4   Period Weeks   Status Achieved           PT Long Term Goals - 02/07/16 1417      PT LONG TERM GOAL #1   Title pt will be I with all HEP given as of last visit (01/13/2016)   Time 8   Period Weeks   Status On-going     PT LONG TERM GOAL #2   Title pt will improve R shoulder flexion / abduction to >/= 110 degrees and ER to >/= 15 degrees to assist with personal grooming, getting dressed and ADLs (01/13/2016)   Time 8   Period Weeks   Status Partially Met     PT LONG TERM GOAL #3   Title pt will improve R shoulder strength to >/= 4-/5 in all planes to assist with ADLS (4/62/8638)   Time 8   Period Weeks   Status On-going     PT LONG TERM GOAL #4   Title foto  score to </=32% limited to demonstrate improvement in function at discharge (01/13/2016)   Time 8   Period Weeks   Status Unable to assess               Plan - 02/21/16 1419    Clinical Impression Statement mr. Malmquist continues to progress with motion. focused on manual to improve motion, and pushing/ pulling activities based on pt report of pain/ limitation. utilized MHP post session for pain.    PT Next Visit Plan  shoulder/ scapular mobs, shoulder strengthening, AAROM/ AROM, stretching, modalites PRN, update HEP, ROM   PT Home Exercise Plan inerior mobs, distraction mobs, pec stretch, I's Y's and T's.   Consulted and Agree with Plan of Care Patient      Patient will benefit from skilled therapeutic intervention in order to improve the following deficits and impairments:  Pain, Improper body mechanics, Postural dysfunction, Hypomobility, Decreased strength, Decreased range of motion, Decreased endurance, Decreased activity tolerance, Increased fascial restricitons  Visit Diagnosis: Chronic right shoulder pain  Stiffness of right shoulder, not elsewhere classified  Muscle weakness (generalized)  Abnormal posture     Problem List Patient Active Problem List   Diagnosis Date Noted  . Right rotator cuff tear 11/10/2015  . Right shoulder pain 10/03/2015  . Eczema 06/14/2015  . Smoker 05/22/2014  . COPD, severe ([place]) 06/22/2013  . Preventative health care 05/20/2013  . Hyperlipidemia 05/20/2013  . Asthma   . Factor 5 Leiden mutation, heterozygous ([place])   . Allergic rhinitis   . Lumbar disc disease   . Left foot drop   . Chronic lumbar radiculopathy   . Chronic pain   . Postlaminectomy syndrome, lumbar region 12/28/2011   Starr Lake PT, DPT, LAT, ATC  02/21/16  2:21 PM      [facility] [address] [place], [place], [address] Phone: [phone]   Fax:  [phone]  Name: Jordell Outten MRN:  [id number] Date of Birth: May 05, 1960

## 2016-02-21 NOTE — Patient Instructions (Signed)
   2 x 12  in each direction, only have to do it on the right side.

## 2016-03-06 ENCOUNTER — Ambulatory Visit: Payer: Commercial Managed Care - HMO | Admitting: Physical Therapy

## 2016-03-13 ENCOUNTER — Other Ambulatory Visit: Payer: Self-pay | Admitting: Family Medicine

## 2016-03-14 NOTE — Telephone Encounter (Signed)
Last refill until patient comes back to see me.

## 2016-03-27 ENCOUNTER — Ambulatory Visit: Payer: Commercial Managed Care - HMO | Admitting: Physical Therapy

## 2016-04-02 ENCOUNTER — Other Ambulatory Visit: Payer: Self-pay | Admitting: Family Medicine

## 2016-04-03 NOTE — Telephone Encounter (Signed)
Refill done. Pt needs to make an appt for future refills

## 2016-04-10 ENCOUNTER — Ambulatory Visit: Payer: Commercial Managed Care - HMO | Admitting: Physical Therapy

## 2016-04-18 ENCOUNTER — Ambulatory Visit: Payer: Commercial Managed Care - HMO | Attending: Internal Medicine | Admitting: Physical Therapy

## 2016-04-18 DIAGNOSIS — M25611 Stiffness of right shoulder, not elsewhere classified: Secondary | ICD-10-CM | POA: Insufficient documentation

## 2016-04-18 DIAGNOSIS — R293 Abnormal posture: Secondary | ICD-10-CM | POA: Insufficient documentation

## 2016-04-18 DIAGNOSIS — G8929 Other chronic pain: Secondary | ICD-10-CM | POA: Diagnosis not present

## 2016-04-18 DIAGNOSIS — M6281 Muscle weakness (generalized): Secondary | ICD-10-CM | POA: Insufficient documentation

## 2016-04-18 DIAGNOSIS — M25511 Pain in right shoulder: Secondary | ICD-10-CM | POA: Diagnosis not present

## 2016-04-18 NOTE — Addendum Note (Signed)
Addended by: Larey Days on: 04/18/2016 03:43 PM   Modules accepted: Orders

## 2016-04-18 NOTE — Therapy (Signed)
[place], [place], [address] Phone: [phone]   Fax:  [phone]  Physical Therapy Treatment / Discharge Note  Patient Details  Name: Cody Grant MRN: [id number] Date of Birth: Aug 31, 1960 Referring Provider: Hulan Saas DO  Encounter Date: 04/18/2016      PT End of Session - 04/18/16 1508    Visit Number 9   Number of Visits 17   Date for PT Re-Evaluation 03/20/16   Authorization Type Medicare: kx mod by 15th visit, progress note by 10th visit   PT Start Time 1505   PT Stop Time 1531   PT Time Calculation (min) 26 min   Activity Tolerance Patient tolerated treatment well   Behavior During Therapy [facility] for tasks assessed/performed      Past Medical History:  Diagnosis Date  . Allergic rhinitis, cause unspecified   . Asthma    as child  . Chronic lumbar radiculopathy    left with left drop foot/numbness  . Chronic pain    to see Dr Delynn Flavin  . COPD, severe ([place]) 06/22/2013  . Factor 5 Leiden mutation, heterozygous ([place])   . Left foot drop   . Lumbar disc disease   . Other and unspecified hyperlipidemia 05/20/2013    Past Surgical History:  Procedure Laterality Date  . SPINE SURGERY  12/2010   micro diskectomy    There were no vitals filed for this visit.      Subjective Assessment - 04/18/16 1506    Subjective "I was sick last week, but I don't have any pain in any direction"    Currently in Pain? No/denies            [facility] PT Assessment - 04/18/16 0001      Observation/Other Assessments   Focus on Therapeutic Outcomes (FOTO)  29% limited     AROM   Right Shoulder Extension 68 Degrees   Right Shoulder Flexion 128 Degrees   Right Shoulder ABduction 92 Degrees   Right Shoulder Internal Rotation 64 Degrees   Right Shoulder External Rotation 68 Degrees     Strength   Right Shoulder Flexion 4+/5   Right Shoulder Extension 5/5   Right Shoulder ABduction 4/5   Right  Shoulder Internal Rotation 4+/5   Right Shoulder External Rotation 4+/5   Right Hand Grip (lbs) 73  78,70,71                             PT Education - 04/18/16 1530    Education provided Yes   Education Details reviewed previously provided HEP, how ot progress HEP with increased Reps/ sets and working on controlling the motion. Updated HEP with proper form and rationale avoiding compensations   Person(s) Educated Patient   Methods Explanation;Verbal cues;Handout   Comprehension Verbalized understanding;Verbal cues required          PT Short Term Goals - 02/07/16 1417      PT SHORT TERM GOAL #1   Title pt will be I with inital HEP (12/19/2015)   Time 4   Period Weeks   Status Achieved     PT SHORT TERM GOAL #2   Title pt will be to verbalize and demosntrate techniques to reduce R shoulder pain and inflammation via RICE and HEP (12/19/2015)   Time 4   Period Weeks   Status Achieved           PT Long Term Goals -  05/02/2016 1515      PT LONG TERM GOAL #1   Title pt will be I with all HEP given as of last visit (01/13/2016)   Time 8   Period Weeks   Status Achieved     PT LONG TERM GOAL #2   Title pt will improve R shoulder flexion / abduction to >/= 110 degrees and ER to >/= 15 degrees to assist with personal grooming, getting dressed and ADLs (01/13/2016)   Time 8   Period Weeks   Status Partially Met     PT LONG TERM GOAL #3   Title pt will improve R shoulder strength to >/= 4-/5 in all planes to assist with ADLS (12/27/9369)   Time 8   Period Weeks   Status Achieved     PT LONG TERM GOAL #4   Title foto score to </=32% limited to demonstrate improvement in function at discharge (01/13/2016)   Time 8   Period Weeks   Status Achieved               Plan - 02-May-2016 1531    Clinical Impression Statement Mr. Schopf has demonstrated improvment in R shoulder strength and AROM with the most limitation in abduction. He met or partially  met all goals, and reports he has been consistent with his HEP. He is able to maintain and progress his current level of function independently and will be discharged from PT.    PT Treatment/Interventions ADLs/Self Care Home Management;Cryotherapy;Electrical Stimulation;Iontophoresis '4mg'$ /ml Dexamethasone;Moist Heat;Therapeutic exercise;Therapeutic activities;Manual techniques;Taping;Dry needling;Passive range of motion;Patient/family education;Ultrasound   PT Next Visit Plan D/C   Consulted and Agree with Plan of Care Patient      Patient will benefit from skilled therapeutic intervention in order to improve the following deficits and impairments:  Pain, Improper body mechanics, Postural dysfunction, Hypomobility, Decreased strength, Decreased range of motion, Decreased endurance, Decreased activity tolerance, Increased fascial restricitons  Visit Diagnosis: Chronic right shoulder pain  Stiffness of right shoulder, not elsewhere classified  Muscle weakness (generalized)  Abnormal posture       G-Codes - 05-02-2016 1535    Functional Assessment Tool Used clinical judgement   Functional Limitation Carrying, moving and handling objects   Carrying, Moving and Handling Objects Goal Status ([id number]) At least 20 percent but less than 40 percent impaired, limited or restricted   Carrying, Moving and Handling Objects Discharge Status [phone]) At least 20 percent but less than 40 percent impaired, limited or restricted      Problem List Patient Active Problem List   Diagnosis Date Noted  . Right rotator cuff tear 11/10/2015  . Right shoulder pain 10/03/2015  . Eczema 06/14/2015  . Smoker 05/22/2014  . COPD, severe ([place]) 06/22/2013  . Preventative health care 05/20/2013  . Hyperlipidemia 05/20/2013  . Asthma   . Factor 5 Leiden mutation, heterozygous ([place])   . Allergic rhinitis   . Lumbar disc disease   . Left foot drop   . Chronic lumbar radiculopathy   . Chronic pain   .  Postlaminectomy syndrome, lumbar region 12/28/2011   Starr Lake PT, DPT, LAT, ATC  2016/05/02  3:36 PM      [facility] [address] [place], [place], [address] Phone: [phone]   Fax:  [phone]  Name: Napoleon Monacelli MRN: [id number] Date of Birth: [birth year]     PHYSICAL THERAPY DISCHARGE SUMMARY  Visits from Start of Care: 9  Current functional level related to goals / functional outcomes:  FOTO 29% limited    Remaining deficits: Limited AROM of the R shoulder in all ranges compared bil with significant limitation in abduction with shoulder hiking in all ranges. Weakness compared bil.    Education / Equipment: HEP, posture education, theraband for strengthening.   Plan: Patient agrees to discharge.  Patient goals were partially met. Patient is being discharged due to being pleased with the current functional level.  ?????

## 2016-04-27 ENCOUNTER — Ambulatory Visit (INDEPENDENT_AMBULATORY_CARE_PROVIDER_SITE_OTHER): Payer: Commercial Managed Care - HMO | Admitting: Internal Medicine

## 2016-04-27 ENCOUNTER — Ambulatory Visit (INDEPENDENT_AMBULATORY_CARE_PROVIDER_SITE_OTHER)
Admission: RE | Admit: 2016-04-27 | Discharge: 2016-04-27 | Disposition: A | Discharge status: Home / Self Care | Payer: Commercial Managed Care - HMO | Source: Ambulatory Visit | Attending: Internal Medicine | Admitting: Internal Medicine

## 2016-04-27 ENCOUNTER — Encounter: Payer: Self-pay | Admitting: Internal Medicine

## 2016-04-27 VITALS — BP 120/60 | HR 94 | Temp 98.3°F | Resp 18 | Ht 68.0 in | Wt 170.8 lb

## 2016-04-27 DIAGNOSIS — R059 Cough, unspecified: Secondary | ICD-10-CM

## 2016-04-27 DIAGNOSIS — J441 Chronic obstructive pulmonary disease with (acute) exacerbation: Secondary | ICD-10-CM | POA: Insufficient documentation

## 2016-04-27 DIAGNOSIS — R05 Cough: Secondary | ICD-10-CM | POA: Diagnosis not present

## 2016-04-27 DIAGNOSIS — R0602 Shortness of breath: Secondary | ICD-10-CM | POA: Diagnosis not present

## 2016-04-27 MED ORDER — HYDROCODONE-HOMATROPINE 5-1.5 MG/5ML PO SYRP: 5.0000 mL | ORAL_SOLUTION | Freq: Four times a day (QID) | ORAL | 0 refills | Status: AC | PRN

## 2016-04-27 MED ORDER — LEVOFLOXACIN 500 MG PO TABS: 500.0000 mg | ORAL_TABLET | Freq: Every day | ORAL | 0 refills | Status: AC

## 2016-04-27 MED ORDER — PREDNISONE 10 MG PO TABS: ORAL_TABLET | ORAL | 0 refills | Status: DC

## 2016-04-27 NOTE — Progress Notes (Signed)
Pre visit review using our clinic review tool, if applicable. No additional management support is needed unless otherwise documented below in the visit note. 

## 2016-04-27 NOTE — Assessment & Plan Note (Signed)
Mild, for short course lower dose predpac asd,  to f/u any worsening symptoms or concerns

## 2016-04-27 NOTE — Progress Notes (Signed)
Subjective:    Patient ID: Cody Grant, male    DOB: 01-28-1961, 55 y.o.   MRN: [id number]  HPI  Here with acute onset mild to mod 2.5 wks ST, HA, general weakness and malaise, with now prod cough greenish sputum,in the last few days, just not getting better, assoc with midl wheeze and sob.  but Pt denies chest pain, orthopnea, PND, increased LE swelling, palpitations, dizziness or syncope.  OTC cough med not working. Pt denies new neurological symptoms such as new headache, or facial or extremity weakness or numbness   Pt denies polydipsia, polyuria    Past Medical History:  Diagnosis Date  . Allergic rhinitis, cause unspecified   . Asthma    as child  . Chronic lumbar radiculopathy    left with left drop foot/numbness  . Chronic pain    to see Dr Delynn Flavin  . 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 Factor 5 Leiden mutation, heterozygous ([place])   . Left foot drop   . Lumbar disc disease   . Other and unspecified hyperlipidemia 05/20/2013   Past Surgical History:  Procedure Laterality Date  . SPINE SURGERY  12/2010   micro diskectomy    reports that he has been smoking Cigarettes.  He has a 30.00 pack-year smoking history. He has never used smokeless tobacco. He reports that he drinks about 1.2 - 2.4 oz of alcohol per week . He reports that he does not use drugs. family history includes COPD in his mother; Diabetes in his mother; Pulmonary embolism in his father. No Known Allergies Current Outpatient Prescriptions on File Prior to Visit  Medication Sig Dispense Refill  . albuterol (ACCUNEB) 0.63 MG/3ML nebulizer solution Take 3 mLs (0.63 mg total) by nebulization every 6 (six) hours as needed for wheezing. 75 mL 12  . albuterol (PROAIR HFA) 108 (90 Base) MCG/ACT inhaler INHALE 2 PUFFS INTO THE LUNGS EVERY 6 HOURS AS NEEDED FOR WHEEZING OR SHORTNESS OF BREATH. 8.5 g 11  . gabapentin (NEURONTIN) 100 MG capsule TAKE 2 CAPSULES(200 MG) BY MOUTH AT BEDTIME 60 capsule 0  . ibuprofen  (ADVIL,MOTRIN) 400 MG tablet Take 1 tablet (400 mg total) by mouth every 8 (eight) hours as needed for pain. 90 tablet 1  . methocarbamol (ROBAXIN) 500 MG tablet Take 500 mg by mouth as needed. Reported on 06/14/2015    . NAPROXEN PO Take 1 tablet by mouth as directed.    Marland Kitchen tiZANidine (ZANAFLEX) 4 MG tablet TAKE 1 TABLET(4 MG) BY MOUTH EVERY 8 HOURS AS NEEDED FOR MUSCLE SPASMS 90 tablet 0  . diclofenac (FLECTOR) 1.3 % PTCH Place 1 patch onto the skin as needed. (Patient not taking: Reported on 04/27/2016) 30 patch 2  . fluocinonide cream (LIDEX) [id number] % Apply 1 application topically 2 (two) times daily. (Patient not taking: Reported on 04/27/2016) 30 g 2  . oxyCODONE (OXY IR/ROXICODONE) 5 MG immediate release tablet Take 1 tablet (5 mg total) by mouth every 8 (eight) hours as needed. May take an extra tablet for severe pain occasionally (Patient not taking: Reported on 04/27/2016) 100 tablet 0  . sildenafil (VIAGRA) 100 MG tablet Take 0.5-1 tablets (50-100 mg total) by mouth daily as needed for erectile dysfunction. (Patient not taking: Reported on 04/27/2016) 5 tablet 11  . tiZANidine (ZANAFLEX) 4 MG tablet TAKE 1 TABLET(4 MG) BY MOUTH EVERY 8 HOURS AS NEEDED FOR MUSCLE SPASMS (Patient not taking: Reported on 04/27/2016) 90 tablet 0  . tiZANidine (ZANAFLEX) 4 MG tablet TAKE 1 TABLET(4  MG) BY MOUTH EVERY 8 HOURS AS NEEDED FOR MUSCLE SPASMS (Patient not taking: Reported on 04/27/2016) 90 tablet 0  . tiZANidine (ZANAFLEX) 4 MG tablet TAKE 1 TABLET(4 MG) BY MOUTH EVERY 8 HOURS AS NEEDED FOR MUSCLE SPASMS (Patient not taking: Reported on 04/27/2016) 90 tablet 0  . triamcinolone cream (KENALOG) 0.1 % Apply 1 application topically 2 (two) times daily. (Patient not taking: Reported on 04/27/2016) 30 g 0   No current facility-administered medications on file prior to visit.     Review of Systems  Constitutional: Negative for unusual diaphoresis or night sweats HENT: Negative for ear swelling or  discharge Eyes: Negative for worsening visual haziness  Respiratory: Negative for choking and stridor.   Gastrointestinal: Negative for distension or worsening eructation Genitourinary: Negative for retention or change in urine volume.  Musculoskeletal: Negative for other MSK pain or swelling Skin: Negative for color change and worsening wound Neurological: Negative for tremors and numbness other than noted  Psychiatric/Behavioral: Negative for decreased concentration or agitation other than above   All other system neg  Per pt    Objective:   Physical Exam BP 120/60   Pulse 94   Temp 98.3 F (36.8 C) (Oral)   Resp 18   Ht 5\' 8"  (1.727 m)   Wt 170 lb 12.8 oz (77.5 kg)   SpO2 95%   BMI 25.97 kg/m  VS noted, mil to mod ill appearing Constitutional: Pt appears in no apparent distress HENT: Head: NCAT.  Right Ear: External ear normal.  Left Ear: External ear normal.  Eyes: . Pupils are equal, round, and reactive to light. Conjunctivae and EOM are normal Neck: Normal range of motion. Neck supple.  Cardiovascular: Normal rate and regular rhythm.   Pulmonary/Chest: Effort normal and breath sounds reduced bilat without rales but with few exp. insp wheezing, no rhonchi.  Neurological: Pt is alert. Not confused , motor grossly intact Skin: Skin is warm. No rash, no LE edema Psychiatric: Pt behavior is normal. No agitation.         Assessment & Plan:

## 2016-04-27 NOTE — Patient Instructions (Signed)
Please take all new medication as prescribed - the antibiotic, and cough medicine, and prednisone as directed  Please continue all other medications as before, and refills have been done if requested.  Please have the pharmacy call with any other refills you may need.  Please keep your appointments with your specialists as you may have planned  Please go to the XRAY Department in the Basement (go straight as you get off the elevator) for the x-ray testing  You will be contacted by phone if any changes need to be made immediately.  Otherwise, you will receive a letter about your results with an explanation, but please check with MyChart first.  Please remember to sign up for MyChart if you have not done so, as this will be important to you in the future with finding out test results, communicating by private email, and scheduling acute appointments online when needed.

## 2016-04-27 NOTE — Assessment & Plan Note (Signed)
Mild to mod, c/w bronchitis vs pna, for antibx course, cough med prn, and cxr,   to f/u any worsening symptoms or concerns  

## 2016-06-19 ENCOUNTER — Ambulatory Visit: Payer: Self-pay | Admitting: Family Medicine

## 2016-06-20 ENCOUNTER — Other Ambulatory Visit (INDEPENDENT_AMBULATORY_CARE_PROVIDER_SITE_OTHER): Payer: Medicare HMO

## 2016-06-20 ENCOUNTER — Encounter: Payer: Self-pay | Admitting: Internal Medicine

## 2016-06-20 ENCOUNTER — Other Ambulatory Visit: Payer: Self-pay | Admitting: Family Medicine

## 2016-06-20 ENCOUNTER — Ambulatory Visit (INDEPENDENT_AMBULATORY_CARE_PROVIDER_SITE_OTHER): Payer: Medicare HMO | Admitting: Family Medicine

## 2016-06-20 ENCOUNTER — Encounter: Payer: Self-pay | Admitting: Family Medicine

## 2016-06-20 ENCOUNTER — Ambulatory Visit (INDEPENDENT_AMBULATORY_CARE_PROVIDER_SITE_OTHER)
Admission: RE | Admit: 2016-06-20 | Discharge: 2016-06-20 | Disposition: A | Discharge status: Home / Self Care | Payer: Medicare HMO | Source: Ambulatory Visit | Attending: Family Medicine | Admitting: Family Medicine

## 2016-06-20 ENCOUNTER — Ambulatory Visit (INDEPENDENT_AMBULATORY_CARE_PROVIDER_SITE_OTHER): Payer: Medicare HMO | Admitting: Internal Medicine

## 2016-06-20 VITALS — BP 126/88 | HR 90 | Temp 98.6°F | Ht 68.5 in | Wt 168.0 lb

## 2016-06-20 VITALS — BP 126/88 | HR 90 | Wt 168.0 lb

## 2016-06-20 DIAGNOSIS — M5116 Intervertebral disc disorders with radiculopathy, lumbar region: Secondary | ICD-10-CM | POA: Diagnosis not present

## 2016-06-20 DIAGNOSIS — Z0001 Encounter for general adult medical examination with abnormal findings: Secondary | ICD-10-CM | POA: Diagnosis not present

## 2016-06-20 DIAGNOSIS — J449 Chronic obstructive pulmonary disease, unspecified: Secondary | ICD-10-CM | POA: Diagnosis not present

## 2016-06-20 DIAGNOSIS — E785 Hyperlipidemia, unspecified: Secondary | ICD-10-CM

## 2016-06-20 DIAGNOSIS — G8929 Other chronic pain: Secondary | ICD-10-CM

## 2016-06-20 DIAGNOSIS — J45909 Unspecified asthma, uncomplicated: Secondary | ICD-10-CM

## 2016-06-20 DIAGNOSIS — M5416 Radiculopathy, lumbar region: Secondary | ICD-10-CM

## 2016-06-20 DIAGNOSIS — Z1159 Encounter for screening for other viral diseases: Secondary | ICD-10-CM

## 2016-06-20 DIAGNOSIS — R7989 Other specified abnormal findings of blood chemistry: Secondary | ICD-10-CM

## 2016-06-20 LAB — BASIC METABOLIC PANEL
BUN: 11 mg/dL (ref 6–23)
CO2: 28 mEq/L (ref 19–32)
CREATININE: 0.73 mg/dL (ref 0.40–1.50)
Calcium: 9.4 mg/dL (ref 8.4–10.5)
Chloride: 106 mEq/L (ref 96–112)
GFR: 118.28 mL/min (ref >60.00)
GLUCOSE: 87 mg/dL (ref 70–99)
POTASSIUM: 4.6 meq/L (ref 3.5–5.1)
Sodium: 138 mEq/L (ref 135–145)

## 2016-06-20 LAB — HEPATIC FUNCTION PANEL
ALBUMIN: 4.6 g/dL (ref 3.5–5.2)
ALT: 20 U/L (ref 0–53)
AST: 18 U/L (ref 0–37)
Alkaline Phosphatase: 45 U/L (ref 39–117)
Bilirubin, Direct: 0.1 mg/dL (ref 0.0–0.3)
Total Bilirubin: 0.7 mg/dL (ref 0.2–1.2)
Total Protein: 6.8 g/dL (ref 6.0–8.3)

## 2016-06-20 LAB — CBC WITH DIFFERENTIAL/PLATELET
Basophils Absolute: 0.1 10*3/uL (ref 0.0–0.1)
Basophils Relative: 1.2 % (ref 0.0–3.0)
EOS ABS: 0.2 10*3/uL (ref 0.0–0.7)
EOS PCT: 2.8 % (ref 0.0–5.0)
HCT: 44.4 % (ref 39.0–52.0)
HEMOGLOBIN: 15 g/dL (ref 13.0–17.0)
Lymphocytes Relative: 19.9 % (ref 12.0–46.0)
Lymphs Abs: 1.8 10*3/uL (ref 0.7–4.0)
MCHC: 33.8 g/dL (ref 30.0–36.0)
MCV: 92.1 fl (ref 78.0–100.0)
MONO ABS: 0.8 10*3/uL (ref 0.1–1.0)
Monocytes Relative: 9 % (ref 3.0–12.0)
Neutro Abs: 6 10*3/uL (ref 1.4–7.7)
Neutrophils Relative %: 67.1 % (ref 43.0–77.0)
Platelets: 241 10*3/uL (ref 150.0–400.0)
RBC: 4.82 Mil/uL (ref 4.22–5.81)
RDW: 13.4 % (ref 11.5–15.5)
WBC: 8.9 10*3/uL (ref 4.0–10.5)

## 2016-06-20 LAB — URINALYSIS, ROUTINE W REFLEX MICROSCOPIC
HGB URINE DIPSTICK: NEGATIVE
LEUKOCYTES UA: NEGATIVE
NITRITE: NEGATIVE
Specific Gravity, Urine: 1.02 (ref 1.000–1.030)
TOTAL PROTEIN, URINE-UPE24: 30 — AB
URINE GLUCOSE: NEGATIVE
Urobilinogen, UA: 1 (ref 0.0–1.0)
pH: 6.5 (ref 5.0–8.0)

## 2016-06-20 LAB — LIPID PANEL
CHOLESTEROL: 198 mg/dL (ref 0–200)
HDL: 41.8 mg/dL (ref >39.00)
NonHDL: 156.12
TRIGLYCERIDES: 236 mg/dL — AB (ref 0.0–149.0)
Total CHOL/HDL Ratio: 5
VLDL: 47.2 mg/dL — ABNORMAL HIGH (ref 0.0–40.0)

## 2016-06-20 LAB — TSH: TSH: 1 u[IU]/mL (ref 0.35–4.50)

## 2016-06-20 LAB — PSA: PSA: 1.03 ng/mL (ref 0.10–4.00)

## 2016-06-20 LAB — LDL CHOLESTEROL, DIRECT: Direct LDL: 126 mg/dL

## 2016-06-20 MED ORDER — ALBUTEROL SULFATE 0.63 MG/3ML IN NEBU: 1.0000 | INHALATION_SOLUTION | Freq: Four times a day (QID) | RESPIRATORY_TRACT | 12 refills | Status: DC | PRN

## 2016-06-20 MED ORDER — OXYCODONE HCL 5 MG PO TABS: 5.0000 mg | ORAL_TABLET | Freq: Three times a day (TID) | ORAL | 0 refills | Status: DC | PRN

## 2016-06-20 MED ORDER — BUDESONIDE-FORMOTEROL FUMARATE 160-4.5 MCG/ACT IN AERO: 2.0000 | INHALATION_SPRAY | Freq: Two times a day (BID) | RESPIRATORY_TRACT | 12 refills | Status: DC

## 2016-06-20 MED ORDER — ALBUTEROL SULFATE HFA 108 (90 BASE) MCG/ACT IN AERS: INHALATION_SPRAY | RESPIRATORY_TRACT | 11 refills | Status: DC

## 2016-06-20 MED ORDER — VENLAFAXINE HCL ER 37.5 MG PO CP24: 37.5000 mg | ORAL_CAPSULE | Freq: Every day | ORAL | 1 refills | Status: DC

## 2016-06-20 NOTE — Patient Instructions (Addendum)
Please take all new medication as prescribed - the symbicort as prescribed  Please continue all other medications as before, and refills have been done if requested - the oxycodone  Please have the pharmacy call with any other refills you may need.  Please continue your efforts at being more active, low cholesterol diet, and weight control.  You are otherwise up to date with prevention measures today.  Please keep your appointments with your specialists as you may have planned  Please go to the LAB in the Basement (turn left off the elevator) for the tests to be done today  You will be contacted by phone if any changes need to be made immediately.  Otherwise, you will receive a letter about your results with an explanation, but please check with MyChart first.  Please remember to sign up for MyChart if you have not done so, as this will be important to you in the future with finding out test results, communicating by private email, and scheduling acute appointments online when needed.  Please return in 6 months, or sooner if needed

## 2016-06-20 NOTE — Progress Notes (Signed)
[facility] [place] [address] [place], [place] [address] Phone: [phone] Subjective:    I'm seeing this patient by the request  of:    CC:   [id number]  Cody Grant is a 56 y.o. male coming in with complaint of Left low back pain. Patient states that unfortunately he is having worsening numbness. Past medical history significant for microdiscectomy and laminectomy. Patient sates that he is having more radicular symptoms as well as increasing numbness of the left foot. Patient states it is affecting daily activities. Possibly some mild weakness. Seems to be worsening. Pain is increasing as well. Has not tolerated many different medications. Patient was given narcotics by primary care provider and is awaiting pain management for further evaluation for his chronic pain syndrome.     Past Medical History:  Diagnosis Date  . Allergic rhinitis, cause unspecified   . Asthma    as child  . Chronic lumbar radiculopathy    left with left drop foot/numbness  . Chronic pain    to see Dr Delynn Flavin  . COPD, severe ([place]) 06/22/2013  . Factor 5 Leiden mutation, heterozygous ([place])   . Left foot drop   . Lumbar disc disease   . Other and unspecified hyperlipidemia 05/20/2013   Past Surgical History:  Procedure Laterality Date  . SPINE SURGERY  12/2010   micro diskectomy   Social History   Social History  . Marital status: Unknown    Spouse name: N/A  . Number of children: N/A  . Years of education: N/A   Occupational History  . Disabled    Social History Main Topics  . Smoking status: Current Every Day Smoker    Packs/day: 1.00    Years: 30.00    Types: Cigarettes  . Smokeless tobacco: Never Used  . Alcohol use 1.2 - 2.4 oz/week    1 - 2 Cans of beer, 1 - 2 Glasses of wine per week  . Drug use: No  . Sexual activity: Not Asked   Other Topics Concern  . None   Social History Narrative  . None   No Known Allergies Family History  Problem  Relation Age of Onset  . COPD Mother   . Diabetes Mother   . Pulmonary embolism Father     Past medical history, social, surgical and family history all reviewed in electronic medical record.  No pertanent information unless stated regarding to the chief complaint.   Review of Systems:Review of systems updated and as accurate as of 06/20/16  No headache, visual changes, nausea, vomiting, diarrhea, constipation, dizziness, abdominal pain, skin rash, fevers, chills, night sweats, weight loss, swollen lymph nodes, body aches, joint swelling,  chest pain, shortness of breath, mood changes.  Positive muscle aches  Objective  Blood pressure 126/88, pulse 90, weight 168 lb (76.2 kg). Systems examined below as of 06/20/16   General: No apparent distress alert and oriented x3 mood and affect normal, dressed appropriately.  HEENT: Pupils equal, extraocular movements intact  Respiratory: Patient's speak in full sentences and does not appear short of breath  Cardiovascular: No lower extremity edema, non tender, no erythema  Skin: Warm dry intact with no signs of infection or rash on extremities or on axial skeleton.  Abdomen: Soft nontender  Neuro: Cranial nerves II through XII are intact, neurovascularly intact in all extremities with 2+ DTRs and 2+ pulses.  Lymph: No lymphadenopathy of posterior or anterior cervical chain or axillae bilaterally.  Gait Mild antalgic gait  MSK:  Non tender with full range of motion and good stability and symmetric strength and tone of shoulders, elbows, wrist, hip, knee and ankles bilaterally.  Back Exam:  Inspection: Unremarkable  Motion: Flexion 35 deg, Extension25 deg, Side Bending to 25 deg bilaterally,  Rotation to 25 deg bilaterally  SLR laying: Left-sided at 20 degrees.  XSLR laying: Negative  Palpable tenderness: Tender to palpation in the paraspinal muscles her lumbar spine left greater than right. FABER: negative. Sensory change: Gross sensation  intact to all lumbar and sacral dermatomes.  Reflexes: 2+ at both patellar tendons, 2+ at achilles tendons, Babinski's downgoing.  Strength at foot  4+ out of 5 strength of the plantar flexion on the left side compared to the conscious lateral side otherwise systemic strength.  Procedure note [id number]; 15 minutes spent for Therapeutic exercises as stated in above notes.  This included exercises focusing on stretching, strengthening, with significant focus on eccentric aspects.  Low back exercises that included:  Pelvic tilt/bracing instruction to focus on control of the pelvic girdle and lower abdominal muscles  Glute strengthening exercises, focusing on proper firing of the glutes without engaging the low back muscles Proper stretching techniques for maximum relief for the hamstrings, hip flexors, low back and some rotation where tolerated  Proper technique shown and discussed handout in great detail with ATC.  All questions were discussed and answered.      Impression and Recommendations:     This case required medical decision making of moderate complexity.      Note: This dictation was prepared with Dragon dictation along with smaller phrase technology. Any transcriptional errors that result from this process are unintentional.

## 2016-06-20 NOTE — Progress Notes (Signed)
Subjective:    Patient ID: Cody Grant, male    DOB: 1960/09/07, 56 y.o.   MRN: [id number]  HPI  Here for wellness and f/u;  Overall doing ok; Pt denies neurological change such as new headache, facial or extremity weakness.  Pt denies polydipsia, polyuria, or low sugar symptoms. Pt states overall good compliance with treatment and medications, good tolerability, and has been trying to follow appropriate diet.  Pt denies worsening depressive symptoms, suicidal ideation or panic. No fever, night sweats, wt loss, loss of appetite, or other constitutional symptoms.  Pt states good ability with ADL's, has low fall risk, home safety reviewed and adequate, no other significant changes in hearing or vision, and not active with exercise.due to chronic pain.  Pt continues to have recurring LBP without change in severity, bowel or bladder change, fever, wt loss,  worsening LLE pain/numbness/weakness, gait change or falls, but no worse but asks for pain clinic referral, also temp rx for oycodone.  Never heard about pain clinic referral from last year.  Gabapentin caused nausea and sleep difficulty, didn't work so well.  Tramadol no help.  Asks for oxycodone he has not had for since last rx feb 2017.  No diversion or overuse.  Plans to see sport medicine later today.    Declines statin for elev chol for now.   Pt denies chest pain, orthopnea, PND, increased LE swelling, palpitations, dizziness or syncope, but has had several months persistent mild wheeze and sob, not worsening, but not better.  No other new hx per pt Past Medical History:  Diagnosis Date  . Allergic rhinitis, cause unspecified   . Asthma    as child  . Chronic lumbar radiculopathy    left with left drop foot/numbness  . Chronic pain    to see Dr Delynn Flavin  . COPD, severe ([place]) 06/22/2013  . Factor 5 Leiden mutation, heterozygous ([place])   . Left foot drop   . Lumbar disc disease   . Other and unspecified hyperlipidemia 05/20/2013    Past Surgical History:  Procedure Laterality Date  . SPINE SURGERY  12/2010   micro diskectomy    reports that he has been smoking Cigarettes.  He has a 30.00 pack-year smoking history. He has never used smokeless tobacco. He reports that he drinks about 1.2 - 2.4 oz of alcohol per week . He reports that he does not use drugs. family history includes COPD in his mother; Diabetes in his mother; Pulmonary embolism in his father. No Known Allergies Current Outpatient Prescriptions on File Prior to Visit  Medication Sig Dispense Refill  . diclofenac (FLECTOR) 1.3 % PTCH Place 1 patch onto the skin as needed. 30 patch 2  . fluocinonide cream (LIDEX) [id number] % Apply 1 application topically 2 (two) times daily. 30 g 2  . ibuprofen (ADVIL,MOTRIN) 400 MG tablet Take 1 tablet (400 mg total) by mouth every 8 (eight) hours as needed for pain. 90 tablet 1  . methocarbamol (ROBAXIN) 500 MG tablet Take 500 mg by mouth as needed. Reported on 06/14/2015    . NAPROXEN PO Take 1 tablet by mouth as directed.    . sildenafil (VIAGRA) 100 MG tablet Take 0.5-1 tablets (50-100 mg total) by mouth daily as needed for erectile dysfunction. 5 tablet 11  . tiZANidine (ZANAFLEX) 4 MG tablet TAKE 1 TABLET(4 MG) BY MOUTH EVERY 8 HOURS AS NEEDED FOR MUSCLE SPASMS 90 tablet 0  . tiZANidine (ZANAFLEX) 4 MG tablet TAKE 1 TABLET(4 MG) BY MOUTH  EVERY 8 HOURS AS NEEDED FOR MUSCLE SPASMS 90 tablet 0  . tiZANidine (ZANAFLEX) 4 MG tablet TAKE 1 TABLET(4 MG) BY MOUTH EVERY 8 HOURS AS NEEDED FOR MUSCLE SPASMS 90 tablet 0  . tiZANidine (ZANAFLEX) 4 MG tablet TAKE 1 TABLET(4 MG) BY MOUTH EVERY 8 HOURS AS NEEDED FOR MUSCLE SPASMS 90 tablet 0  . triamcinolone cream (KENALOG) 0.1 % Apply 1 application topically 2 (two) times daily. 30 g 0   No current facility-administered medications on file prior to visit.      Review of Systems Constitutional: Negative for increased diaphoresis, or other activity, appetite or siginficant weight change  other than noted HENT: Negative for worsening hearing loss, ear pain, facial swelling, mouth sores and neck stiffness.   Eyes: Negative for other worsening pain, redness or visual disturbance.  Respiratory: Negative for choking or stridor Cardiovascular: Negative for other chest pain and palpitations.  Gastrointestinal: Negative for worsening diarrhea, blood in stool, or abdominal distention Genitourinary: Negative for hematuria, flank pain or change in urine volume.  Musculoskeletal: Negative for myalgias or other joint complaints.  Skin: Negative for other color change and wound or drainage.  Neurological: Negative for syncope and numbness. other than noted Hematological: Negative for adenopathy. or other swelling Psychiatric/Behavioral: Negative for hallucinations, SI, self-injury, decreased concentration or other worsening agitation.  All other system  Neg per pt    Objective:   Physical Exam BP 126/88   Pulse 90   Temp 98.6 F (37 C)   Ht 5' 8.5" (1.74 m)   Wt 168 lb (76.2 kg)   SpO2 96%   BMI 25.17 kg/m  VS noted,  Constitutional: Pt is oriented to person, place, and time. Appears well-developed and well-nourished, in no significant distress Head: Normocephalic and atraumatic  Eyes: Conjunctivae and EOM are normal. Pupils are equal, round, and reactive to light Right Ear: External ear normal.  Left Ear: External ear normal Nose: Nose normal.  Mouth/Throat: Oropharynx is clear and moist  Neck: Normal range of motion. Neck supple. No JVD present. No tracheal deviation present or significant neck LA or mass Cardiovascular: Normal rate, regular rhythm, normal heart sounds and intact distal pulses.   Pulmonary/Chest: Effort normal and breath sounds decreased without rales but with mild bilat wheezing throughout Spine: nontender midline; has some primarily left buttock, thigh and distal leg symptoms Abdominal: Soft. Bowel sounds are normal. NT. No HSM  Musculoskeletal: Normal  range of motion. Exhibits no edema Lymphadenopathy: Has no cervical adenopathy.  Neurological: Pt is alert and oriented to person, place, and time. Pt has normal reflexes. No cranial nerve deficit. Motor grossly intact except for + left foot drop Skin: Skin is warm and dry. No rash noted or new ulcers Psychiatric:  Has normal mood and affect. Behavior is normal.  No other exam findings    Assessment & Plan:

## 2016-06-20 NOTE — Patient Instructions (Signed)
Good to see you  Ice 20 minutes 2 times daily. Usually after activity and before bed. Effexor 37.5mg  daily  Xray downstairs today Exercises 3 times a week.  See me again in 2-3 weeks if not better we will get you into workmans comp doc.

## 2016-06-20 NOTE — Telephone Encounter (Signed)
Refill done.  

## 2016-06-20 NOTE — Assessment & Plan Note (Signed)
Patient does have some left foot pain. I do think that the radicular symptoms is positive with patient straight leg test. History of microdiscectomy previously. Patient also has had a laminectomy. Patient's states that this seems to be worsening symptoms. Unable to do prednisone with him recently having a COPD exacerbation and was on steroids. Patient will be put on Effexor at a low dose and has not been able to tolerate other medications including gabapentin, Lyrica, amitriptyline, nortriptyline. X-rays ordered today to further evaluate for any other adjacent segment disease a could be contribute in. Follow-up again in 2-3 weeks. Patient does have a workman's comp case on this. Worsening symptoms may need to get him to a surgeon.

## 2016-06-21 LAB — HEPATITIS C ANTIBODY: HCV Ab: NEGATIVE

## 2016-06-21 LAB — HIV ANTIBODY (ROUTINE TESTING W REFLEX): HIV: NONREACTIVE

## 2016-06-25 NOTE — Assessment & Plan Note (Signed)

## 2016-06-25 NOTE — Assessment & Plan Note (Signed)
Mild uncontrolled, to add symbicort asd,  to f/u any worsening symptoms or concerns 

## 2016-06-25 NOTE — Assessment & Plan Note (Signed)
Goal ldl < 100, declines statin, for lower chol diet first per pt

## 2016-06-25 NOTE — Assessment & Plan Note (Signed)
stable overall by history and exam, and pt to continue medical treatment as before,  to f/u any worsening symptoms or concerns 

## 2016-06-25 NOTE — Assessment & Plan Note (Addendum)
Stable chronic, for oxycodone refill, refer pain management  In addition to the time spent performing CPE, I spent an additional 25 minutes face to face,in which greater than 50% of this time was spent in counseling and coordination of care for patient's acute illness as documented.

## 2016-07-11 ENCOUNTER — Ambulatory Visit: Payer: Self-pay | Admitting: Family Medicine

## 2016-07-24 ENCOUNTER — Other Ambulatory Visit: Payer: Self-pay | Admitting: Internal Medicine

## 2016-07-25 MED ORDER — OXYCODONE HCL 5 MG PO TABS: 5.0000 mg | ORAL_TABLET | Freq: Three times a day (TID) | ORAL | 0 refills | Status: DC | PRN

## 2016-07-25 NOTE — Telephone Encounter (Signed)
Done hardcopy to Shirron  

## 2016-07-25 NOTE — Addendum Note (Signed)
Addended by: Biagio Borg on: 07/25/2016 06:41 PM   Modules accepted: Orders

## 2016-07-25 NOTE — Telephone Encounter (Signed)
Ok to let pt know, I do not treat chronic pain, and cannot continue to provide ongoing rx for the oxycodone  I mentioned this on the last rx in Feb, that that rx would be the last, thnaks

## 2016-07-26 NOTE — Telephone Encounter (Signed)
Called pt. LVM. Script at front desk.

## 2016-08-01 NOTE — Progress Notes (Signed)
[facility] [address] [place], [place] [address] Phone: [phone] Subjective:    I'm seeing this patient by the request  of:    CC: Low back pain  VOZ:[id number]  Cody Grant is a 56 y.o. male coming in with complaint of Left low back pain. Patient states that unfortunately he is having worsening numbness. Past medical history significant for microdiscectomy and laminectomy. Patient was seen and having more radicular symptoms as well as some weakness. X-rays that showed the patient did have L4 on L5 anterior spurring noted very minimal arthritic changes otherwise. Patient was started on low dose of Effexor. Has failed gabapentin in the past. Patient states Unable to tolerate the Effexor. Patient continues to have chronic pain. Seen a pain management specialist in one month. Had another refill of pain medication from his primary care provider. States that he does not make any significant improvement and states that the back pain is unrelenting.     Past Medical History:  Diagnosis Date  . Allergic rhinitis, cause unspecified   . Asthma    as child  . Chronic lumbar radiculopathy    left with left drop foot/numbness  . Chronic pain    to see Dr Delynn Flavin  . 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 Factor 5 Leiden mutation, heterozygous ([place])   . Left foot drop   . Lumbar disc disease   . Other and unspecified hyperlipidemia 05/20/2013   Past Surgical History:  Procedure Laterality Date  . SPINE SURGERY  12/2010   micro diskectomy   Social History   Social History  . Marital status: Unknown    Spouse name: N/A  . Number of children: N/A  . Years of education: N/A   Occupational History  . Disabled    Social History Main Topics  . Smoking status: Current Every Day Smoker    Packs/day: 1.00    Years: 30.00    Types: Cigarettes  . Smokeless tobacco: Never Used  . Alcohol use 1.2 - 2.4 oz/week    1 - 2 Glasses of wine, 1 - 2 Cans of beer per  week  . Drug use: No  . Sexual activity: Not Asked   Other Topics Concern  . None   Social History Narrative  . None   No Known Allergies Family History  Problem Relation Age of Onset  . COPD Mother   . Diabetes Mother   . Pulmonary embolism Father     Past medical history, social, surgical and family history all reviewed in electronic medical record.  No pertanent information unless stated regarding to the chief complaint.   Review of Systems: No headache, visual changes, nausea, vomiting, diarrhea, constipation, dizziness, abdominal pain, skin rash, fevers, chills, night sweats, weight loss, swollen lymph nodes, chest pain, shortness of breath, mood changes.  Positive body aches and muscle aches  Objective  Blood pressure 116/78, pulse 81, resp. rate 16, weight 167 lb 2 oz (75.8 kg), SpO2 97 %.   Systems examined below as of 08/02/16 General: NAD A&O x3 mood, affect normal  HEENT: Pupils equal, extraocular movements intact no nystagmus Respiratory: not short of breath at rest or with speaking Cardiovascular: No lower extremity edema, non tender Skin: Warm dry intact with no signs of infection or rash on extremities or on axial skeleton. Abdomen: Soft nontender, no masses Neuro: Cranial nerves  intact, neurovascularly intact in all extremities with 2+ DTRs and 2+ pulses. Lymph: No lymphadenopathy appreciated today  Gait  normal with good balance and coordination.  MSK: Non tender with full range of motion and good stability and symmetric strength and tone of shoulders, elbows, wrist,  knee hips and ankles bilaterally.   Back Exam:  Inspection: Unremarkable  Motion: Flexion 35 deg, Extension20 deg, Side Bending to 25 deg bilaterally,  Rotation to 25 deg bilaterally  SLR laying: Continue positive straight leg test on the left side  XSLR laying: Negative  Palpable tenderness: Tenderness in the paraspinal musculature of the lumbar spine on the right side. FABER:  negative. Sensory change: Gross sensation intact to all lumbar and sacral dermatomes.  Reflexes: 2+ at both patellar tendons, 2+ at achilles tendons, Babinski's downgoing.  Strength at foot  Continues to have mild weakness of 4 out of 5 strength on the left side with plantarflexion compared to the contralateral side.       Impression and Recommendations:     This case required medical decision making of moderate complexity.      Note: This dictation was prepared with Dragon dictation along with smaller phrase technology. Any transcriptional errors that result from this process are unintentional.

## 2016-08-02 ENCOUNTER — Encounter: Payer: Self-pay | Admitting: Family Medicine

## 2016-08-02 ENCOUNTER — Ambulatory Visit (INDEPENDENT_AMBULATORY_CARE_PROVIDER_SITE_OTHER): Payer: Medicare HMO | Admitting: Family Medicine

## 2016-08-02 DIAGNOSIS — M5416 Radiculopathy, lumbar region: Secondary | ICD-10-CM | POA: Diagnosis not present

## 2016-08-02 NOTE — Progress Notes (Signed)
Pre-visit discussion using our clinic review tool. No additional management support is needed unless otherwise documented below in the visit note.  

## 2016-08-02 NOTE — Patient Instructions (Signed)
Good to see you  Cody Grant is your friend.  Stay active.  [place] at [place] if [employer] will let you  Their number is [phone].  See me again or write me when you need me

## 2016-08-02 NOTE — Assessment & Plan Note (Signed)
Continues to have some radicular symptoms going on the left leg with mild weakness. We discussed with patient again about the potential for further imaging which patient declined. Patient is trying to have this under Workmen's Comp. Discussed with him that he can do this. I do not feel comfortable doing chronic pain medications in patient will come back to me if anything is also necessary. Patient will continue with the pain management as well.

## 2016-09-04 DIAGNOSIS — M545 Low back pain: Secondary | ICD-10-CM | POA: Diagnosis not present

## 2016-09-04 DIAGNOSIS — G894 Chronic pain syndrome: Secondary | ICD-10-CM | POA: Diagnosis not present

## 2016-09-04 DIAGNOSIS — M5432 Sciatica, left side: Secondary | ICD-10-CM | POA: Diagnosis not present

## 2016-09-04 DIAGNOSIS — G5792 Unspecified mononeuropathy of left lower limb: Secondary | ICD-10-CM | POA: Diagnosis not present

## 2016-10-02 DIAGNOSIS — M545 Low back pain: Secondary | ICD-10-CM | POA: Diagnosis not present

## 2016-10-02 DIAGNOSIS — G894 Chronic pain syndrome: Secondary | ICD-10-CM | POA: Diagnosis not present

## 2016-11-07 DIAGNOSIS — M545 Low back pain: Secondary | ICD-10-CM | POA: Diagnosis not present

## 2016-11-07 DIAGNOSIS — G894 Chronic pain syndrome: Secondary | ICD-10-CM | POA: Diagnosis not present

## 2016-11-07 DIAGNOSIS — M25512 Pain in left shoulder: Secondary | ICD-10-CM | POA: Diagnosis not present

## 2016-11-28 DIAGNOSIS — Z79891 Long term (current) use of opiate analgesic: Secondary | ICD-10-CM | POA: Diagnosis not present

## 2016-11-28 DIAGNOSIS — M545 Low back pain: Secondary | ICD-10-CM | POA: Diagnosis not present

## 2016-11-28 DIAGNOSIS — G894 Chronic pain syndrome: Secondary | ICD-10-CM | POA: Diagnosis not present

## 2016-12-12 DIAGNOSIS — M62552 Muscle wasting and atrophy, not elsewhere classified, left thigh: Secondary | ICD-10-CM | POA: Diagnosis not present

## 2016-12-19 ENCOUNTER — Ambulatory Visit (INDEPENDENT_AMBULATORY_CARE_PROVIDER_SITE_OTHER): Payer: Medicare HMO | Admitting: Internal Medicine

## 2016-12-19 ENCOUNTER — Encounter: Payer: Self-pay | Admitting: Internal Medicine

## 2016-12-19 VITALS — BP 122/78 | HR 77 | Temp 98.3°F | Ht 68.5 in | Wt 165.0 lb

## 2016-12-19 DIAGNOSIS — F17200 Nicotine dependence, unspecified, uncomplicated: Secondary | ICD-10-CM | POA: Diagnosis not present

## 2016-12-19 DIAGNOSIS — G8929 Other chronic pain: Secondary | ICD-10-CM | POA: Diagnosis not present

## 2016-12-19 DIAGNOSIS — J449 Chronic obstructive pulmonary disease, unspecified: Secondary | ICD-10-CM | POA: Diagnosis not present

## 2016-12-19 DIAGNOSIS — Z0000 Encounter for general adult medical examination without abnormal findings: Secondary | ICD-10-CM | POA: Diagnosis not present

## 2016-12-19 DIAGNOSIS — E785 Hyperlipidemia, unspecified: Secondary | ICD-10-CM

## 2016-12-19 MED ORDER — TIOTROPIUM BROMIDE MONOHYDRATE 18 MCG IN CAPS: 18.0000 ug | ORAL_CAPSULE | Freq: Every day | RESPIRATORY_TRACT | 3 refills | Status: DC

## 2016-12-19 NOTE — Patient Instructions (Addendum)
Please take all new medication as prescribed - the spiriva  You will be contacted regarding the referral for: Pulmonary  Please continue all other medications as before, and refills have been done if requested.  Please have the pharmacy call with any other refills you may need.  Please continue your efforts at being more active, low cholesterol diet, and weight control.  Please keep your appointments with your specialists as you may have planned  Please return in 6 months, or sooner if needed, with Lab testing done 3-5 days before

## 2016-12-19 NOTE — Assessment & Plan Note (Signed)
D/w pt, encouraged to quit, declines chantix,  to f/u any worsening symptoms or concerns

## 2016-12-19 NOTE — Assessment & Plan Note (Signed)
Ok for spiriva restart, and refer pulmonary per pt request

## 2016-12-19 NOTE — Assessment & Plan Note (Signed)
Stable, to cont pain management

## 2016-12-19 NOTE — Assessment & Plan Note (Signed)
stable overall by history and exam, recent data reviewed with pt, and pt to continue medical treatment as before,  to f/u any worsening symptoms or concerns Lab Results  Component Value Date   CHOL 198 06/20/2016   HDL 41.80 06/20/2016   LDLDIRECT 126.0 06/20/2016   TRIG 236.0 (H) 06/20/2016   CHOLHDL 5 06/20/2016  for lower chol diet

## 2016-12-19 NOTE — Progress Notes (Signed)
Subjective:    Patient ID: Cody Grant, male    DOB: 10-07-1960, 56 y.o.   MRN: [id number]  HPI  Here to f/u; overall doing ok,  Pt denies chest pain, increasing sob or doe, wheezing, orthopnea, PND, increased LE swelling, palpitations, dizziness or syncope, except asks to restart spiriva bc helped before just too expensive.  Pt denies new neurological symptoms such as new headache, or facial or extremity weakness or numbness.  Pt denies polydipsia, polyuria, Still smoking, not yet ready to quit BP Readings from Last 3 Encounters:  12/19/16 122/78  08/02/16 116/78  06/20/16 126/88   Wt Readings from Last 3 Encounters:  12/19/16 165 lb (74.8 kg)  08/02/16 167 lb 2 oz (75.8 kg)  06/20/16 168 lb (76.2 kg)  Going to pain management, also plans to see Dr Tamala Julian for left shoulder pain Past Medical History:  Diagnosis Date  . Allergic rhinitis, cause unspecified   . Asthma    as child  . Chronic lumbar radiculopathy    left with left drop foot/numbness  . Chronic pain    to see Dr Delynn Flavin  . 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 Factor 5 Leiden mutation, heterozygous ([place])   . Left foot drop   . Lumbar disc disease   . Other and unspecified hyperlipidemia 05/20/2013   Past Surgical History:  Procedure Laterality Date  . SPINE SURGERY  12/2010   micro diskectomy    reports that he has been smoking Cigarettes.  He has a 30.00 pack-year smoking history. He has never used smokeless tobacco. He reports that he drinks about 1.2 - 2.4 oz of alcohol per week . He reports that he does not use drugs. family history includes COPD in his mother; Diabetes in his mother; Pulmonary embolism in his father. No Known Allergies Current Outpatient Prescriptions on File Prior to Visit  Medication Sig Dispense Refill  . albuterol (ACCUNEB) 0.63 MG/3ML nebulizer solution Take 3 mLs (0.63 mg total) by nebulization every 6 (six) hours as needed for wheezing. 75 mL 12  . albuterol (PROAIR HFA) 108 (90 Base)  MCG/ACT inhaler INHALE 2 PUFFS INTO THE LUNGS EVERY 6 HOURS AS NEEDED FOR WHEEZING OR SHORTNESS OF BREATH. 8.5 g 11  . budesonide-formoterol (SYMBICORT) 160-4.5 MCG/ACT inhaler Inhale 2 puffs into the lungs 2 (two) times daily. 1 Inhaler 12  . diclofenac (FLECTOR) 1.3 % PTCH Place 1 patch onto the skin as needed. 30 patch 2  . fluocinonide cream (LIDEX) 5.64 % Apply 1 application topically 2 (two) times daily. 30 g 2  . ibuprofen (ADVIL,MOTRIN) 400 MG tablet Take 1 tablet (400 mg total) by mouth every 8 (eight) hours as needed for pain. 90 tablet 1  . NAPROXEN PO Take 1 tablet by mouth as directed.    Marland Kitchen oxyCODONE (OXY IR/ROXICODONE) 5 MG immediate release tablet Take 1 tablet (5 mg total) by mouth every 8 (eight) hours as needed. May take an extra tablet for severe pain occasionally 100 tablet 0  . sildenafil (VIAGRA) 100 MG tablet Take 0.5-1 tablets (50-100 mg total) by mouth daily as needed for erectile dysfunction. 5 tablet 11  . tiZANidine (ZANAFLEX) 4 MG tablet TAKE 1 TABLET(4 MG) BY MOUTH EVERY 8 HOURS AS NEEDED FOR MUSCLE SPASMS 90 tablet 0  . triamcinolone cream (KENALOG) 0.1 % Apply 1 application topically 2 (two) times daily. 30 g 0   No current facility-administered medications on file prior to visit.    Review of Systems  Constitutional: Negative for  other unusual diaphoresis or sweats HENT: Negative for ear discharge or swelling Eyes: Negative for other worsening visual disturbances Respiratory: Negative for stridor or other swelling  Gastrointestinal: Negative for worsening distension or other blood Genitourinary: Negative for retention or other urinary change Musculoskeletal: Negative for other MSK pain or swelling Skin: Negative for color change or other new lesions Neurological: Negative for worsening tremors and other numbness  Psychiatric/Behavioral: Negative for worsening agitation or other fatigue All other system neg per pt    Objective:   Physical Exam BP 122/78    Pulse 77   Temp 98.3 F (36.8 C)   Ht 5' 8.5" (1.74 m)   Wt 165 lb (74.8 kg)   SpO2 98%   BMI 24.72 kg/m  VS noted,  Constitutional: Pt appears in NAD HENT: Head: NCAT.  Right Ear: External ear normal.  Left Ear: External ear normal.  Eyes: . Pupils are equal, round, and reactive to light. Conjunctivae and EOM are normal Nose: without d/c or deformity Neck: Neck supple. Gross normal ROM Cardiovascular: Normal rate and regular rhythm.   Pulmonary/Chest: Effort normal and breath sounds decreased without rales or wheezing.  Abd:  Soft, NT, ND, + BS, no organomegaly Neurological: Pt is alert. At baseline orientation, motor grossly intact Skin: Skin is warm. No rashes, other new lesions, no LE edema Psychiatric: Pt behavior is normal without agitation  No other exam findings    Assessment & Plan:

## 2016-12-25 DIAGNOSIS — Z79891 Long term (current) use of opiate analgesic: Secondary | ICD-10-CM | POA: Diagnosis not present

## 2016-12-25 DIAGNOSIS — M545 Low back pain: Secondary | ICD-10-CM | POA: Diagnosis not present

## 2016-12-25 DIAGNOSIS — G894 Chronic pain syndrome: Secondary | ICD-10-CM | POA: Diagnosis not present

## 2017-01-12 DIAGNOSIS — M62552 Muscle wasting and atrophy, not elsewhere classified, left thigh: Secondary | ICD-10-CM | POA: Diagnosis not present

## 2017-01-25 ENCOUNTER — Ambulatory Visit (INDEPENDENT_AMBULATORY_CARE_PROVIDER_SITE_OTHER)
Admission: RE | Admit: 2017-01-25 | Discharge: 2017-01-25 | Disposition: A | Discharge status: Home / Self Care | Payer: Medicare HMO | Source: Ambulatory Visit | Attending: Internal Medicine | Admitting: Internal Medicine

## 2017-01-25 ENCOUNTER — Encounter: Payer: Self-pay | Admitting: Internal Medicine

## 2017-01-25 ENCOUNTER — Ambulatory Visit (INDEPENDENT_AMBULATORY_CARE_PROVIDER_SITE_OTHER): Payer: Medicare HMO | Admitting: Internal Medicine

## 2017-01-25 VITALS — BP 120/80 | HR 101 | Ht 68.5 in | Wt 167.4 lb

## 2017-01-25 DIAGNOSIS — J449 Chronic obstructive pulmonary disease, unspecified: Secondary | ICD-10-CM

## 2017-01-25 DIAGNOSIS — R0602 Shortness of breath: Secondary | ICD-10-CM | POA: Diagnosis not present

## 2017-01-25 DIAGNOSIS — R05 Cough: Secondary | ICD-10-CM | POA: Diagnosis not present

## 2017-01-25 DIAGNOSIS — F17210 Nicotine dependence, cigarettes, uncomplicated: Secondary | ICD-10-CM

## 2017-01-25 MED ORDER — TIOTROPIUM BROMIDE-OLODATEROL 2.5-2.5 MCG/ACT IN AERS: 2.0000 | INHALATION_SPRAY | Freq: Every day | RESPIRATORY_TRACT | 11 refills | Status: DC

## 2017-01-25 MED ORDER — TIOTROPIUM BROMIDE-OLODATEROL 2.5-2.5 MCG/ACT IN AERS: 2.0000 | INHALATION_SPRAY | Freq: Every day | RESPIRATORY_TRACT | 0 refills | Status: DC

## 2017-01-25 NOTE — Progress Notes (Signed)
Subjective:     Patient ID: Cody Grant, male   DOB: 1961/04/29, 56 y.o.   MRN: [id number]  HPI   23 yowm active smoker with GOLD II copd with large reversible component 2015 referred to pulmonary clinic 01/25/2017 by Dr   Jenny Reichmann   01/25/2017 1st [facility] office visit/ Wert   Chief Complaint  Patient presents with  . Pulmonary Consult    Referred by Dr. Cathlean Cower. Pt dxed with COPD in 2016. He states he has trouble with his breathing with hot/humid weather. He tends to have trouble in the am's, and has a non prod cough. He states he went a period of time without his spiriva due to cost and breathing got worse, but has improved since he started this back recently. He uses proair and also albuterol neb both 1 x per wk on average.   limited more by L radicular back  pain than   sob maint on spiriva dpi which he assoc with upper airway pattern coughing  Recurrent acute cough flares 2 x year then needs neb more then but usually not needing  Much saba unless he overdoes it out in the heat      No obvious patterns  day to day or daytime variability or assoc excess/ purulent sputum or mucus plugs or hemoptysis or cp or chest tightness, subjective wheeze or overt sinus or hb symptoms. No unusual exp hx or h/o childhood pna/ asthma or knowledge of premature birth.  Sleeping ok flat without nocturnal  or early am exacerbation  of respiratory  c/o's or need for noct saba. Also denies any obvious fluctuation of symptoms with weather or environmental changes or other aggravating or alleviating factors except as outlined above   Current Allergies, Complete Past Medical History, Past Surgical History, Family History, and Social History were reviewed in [employer] record.  ROS  The following are not active complaints unless bolded sore throat, dysphagia, dental problems, itching, sneezing,  nasal congestion or disharge of excess mucus or purulent secretions, ear ache,   fever,  chills, sweats, unintended wt loss or wt gain, classically pleuritic or exertional cp,  orthopnea pnd or leg swelling, presyncope, palpitations, abdominal pain, anorexia, nausea, vomiting, diarrhea  or change in bowel habits or bladder habits, change in stools or change in urine, dysuria, hematuria,  rash, arthralgias, visual complaints, headache, numbness, weakness or ataxia or problems with walking or coordination,  change in mood/affect or memory.        Current Meds  Medication Sig  . albuterol (ACCUNEB) 0.63 MG/3ML nebulizer solution Take 3 mLs (0.63 mg total) by nebulization every 6 (six) hours as needed for wheezing.  Marland Kitchen albuterol (PROAIR HFA) 108 (90 Base) MCG/ACT inhaler INHALE 2 PUFFS INTO THE LUNGS EVERY 6 HOURS AS NEEDED FOR WHEEZING OR SHORTNESS OF BREATH.  Marland Kitchen diclofenac (FLECTOR) 1.3 % PTCH Place 1 patch onto the skin as needed.  Marland Kitchen ibuprofen (ADVIL,MOTRIN) 400 MG tablet Take 1 tablet (400 mg total) by mouth every 8 (eight) hours as needed for pain.  Marland Kitchen NAPROXEN PO Take 1 tablet by mouth as directed.  Marland Kitchen oxyCODONE (OXY IR/ROXICODONE) 5 MG immediate release tablet Take 1 tablet (5 mg total) by mouth every 8 (eight) hours as needed. May take an extra tablet for severe pain occasionally  . sildenafil (VIAGRA) 100 MG tablet Take 0.5-1 tablets (50-100 mg total) by mouth daily as needed for erectile dysfunction.  Marland Kitchen tiZANidine (ZANAFLEX) 4 MG tablet TAKE 1 TABLET(4 MG)  BY MOUTH EVERY 8 HOURS AS NEEDED FOR MUSCLE SPASMS  . [DISCONTINUED] tiotropium (SPIRIVA HANDIHALER) 18 MCG inhalation capsule Place 1 capsule (18 mcg total) into inhaler and inhale daily.          Review of Systems     Objective:   Physical Exam    amb hoarse wm freq throat clearing  Wt Readings from Last 3 Encounters:  01/25/17 167 lb 6.4 oz (75.9 kg)  12/19/16 165 lb (74.8 kg)  08/02/16 167 lb 2 oz (75.8 kg)    Vital signs reviewed    HEENT: nl   turbinates bilaterally, and oropharynx.  ear canals without cough  reflex but severe wax  - poor dentition   NECK :  without JVD/Nodes/TM/ nl carotid upstrokes bilaterally   LUNGS: no acc muscle use,  slt barrel chest with distant late exp wheeze bilaterally better with plm   CV:  RRR  no s3 or murmur or increase in P2, and no edema   ABD:  soft and nontender with nl inspiratory excursion in the supine position. No bruits or organomegaly appreciated, bowel sounds nl  MS:  Nl gait/ ext warm without deformities, calf tenderness, cyanosis or clubbing No obvious joint restrictions   SKIN: warm and dry without lesions    NEURO:  alert, approp, nl sensorium with  no motor or cerebellar deficits apparent.    CXR PA and Lateral:   01/25/2017 :    I personally reviewed images and agree with radiology impression as follows:   No acute cardiopulmonary disease.  Assessment:

## 2017-01-25 NOTE — Patient Instructions (Addendum)
Plan A = Automatic = Stiolto 2 pffs each am   Work on inhaler technique:  relax and gently blow all the way out then take a nice smooth deep breath back in, triggering the inhaler at same time you start breathing in.  Hold for up to 5 seconds if you can. Blow out thru nose. Rinse and gargle with water when done      Plan B = Backup Only use your albuterol as a rescue medication to be used if you can't catch your breath by resting or doing a relaxed purse lip breathing pattern.  - The less you use it, the better it will work when you need it. - Ok to use the inhaler up to 2 puffs  every 4 hours if you must but call for appointment if use goes up over your usual need - Don't leave home without it !!  (think of it like the spare tire for your car)   Plan C = Crisis - only use your albuterol nebulizer if you first try Plan B and it fails to help > ok to use the nebulizer up to every 4 hours but if start needing it regularly call for immediate appointment   Please remember to go to the  x-ray department downstairs in the basement  for your tests - we will call you with the results when they are available.  The key is to stop smoking completely before smoking completely stops you!       Please schedule a follow up office visit in 6 weeks, call sooner if needed with pfts

## 2017-01-26 NOTE — Assessment & Plan Note (Addendum)
PFT's   05/20/2013   FEV1 1.95 (54 % ) ratio 59  p 39 % improvement from saba p ? prior to study with DLCO  62 % corrects to 85  % for alv volume   - 01/25/2017  After extensive coaching device effectiveness =   90% with hfa and smi > try stiolto 2 each am > return for full pfts in 6 weeks   He appears to have severe copd but due to limited ambulation from back pain is minimally symptomatic at this point despite ongoing cig use (see separate a/p)     Likely pt is   is Group B or D  in terms of symptom/risk and laba/lama therefore appropriate rx at this point.  Try stiolto if covered by formulary since reports some better on spiriva dpi but finds the powder irritating to the upper airway and that way also his maint and rescue are both inhaled sprays.    Formulary restrictions will be an ongoing challenge for the forseable future and I would be happy to pick an alternative if the pt will first  provide me a list of them but pt  will need to return here for training for any new device that is required eg dpi vs hfa vs respimat.    In meantime we can always provide samples so the patient never runs out of any needed respiratory medications.    Total time devoted to counseling  > 50 % of initial 60 min office visit:  review case with pt/ discussion of options/alternatives/ personally creating written customized instructions  in presence of pt  then going over those specific  Instructions directly with the pt including how to use all of the meds but in particular covering each new medication in detail and the difference between the maintenance= "automatic" meds and the prns using an action plan format for the latter (If this problem/symptom => do that organization reading Left to right).  Please see AVS from this visit for a full list of these instructions which I personally wrote for this pt and  are unique to this visit.

## 2017-01-26 NOTE — Assessment & Plan Note (Signed)

## 2017-01-26 NOTE — Progress Notes (Signed)
Spoke with pt and notified of results per Dr. Wert. Pt verbalized understanding and denied any questions. 

## 2017-01-29 DIAGNOSIS — Z79891 Long term (current) use of opiate analgesic: Secondary | ICD-10-CM | POA: Diagnosis not present

## 2017-01-29 DIAGNOSIS — G894 Chronic pain syndrome: Secondary | ICD-10-CM | POA: Diagnosis not present

## 2017-01-29 DIAGNOSIS — M545 Low back pain: Secondary | ICD-10-CM | POA: Diagnosis not present

## 2017-02-11 DIAGNOSIS — M62552 Muscle wasting and atrophy, not elsewhere classified, left thigh: Secondary | ICD-10-CM | POA: Diagnosis not present

## 2017-02-26 DIAGNOSIS — M545 Low back pain: Secondary | ICD-10-CM | POA: Diagnosis not present

## 2017-02-26 DIAGNOSIS — G894 Chronic pain syndrome: Secondary | ICD-10-CM | POA: Diagnosis not present

## 2017-02-26 DIAGNOSIS — Z79891 Long term (current) use of opiate analgesic: Secondary | ICD-10-CM | POA: Diagnosis not present

## 2017-03-09 ENCOUNTER — Encounter: Payer: Self-pay | Admitting: Internal Medicine

## 2017-03-09 ENCOUNTER — Ambulatory Visit: Payer: Medicare HMO | Admitting: Internal Medicine

## 2017-03-09 ENCOUNTER — Ambulatory Visit (INDEPENDENT_AMBULATORY_CARE_PROVIDER_SITE_OTHER): Payer: Medicare HMO | Admitting: Internal Medicine

## 2017-03-09 VITALS — BP 124/68 | HR 95 | Ht 67.5 in | Wt 166.0 lb

## 2017-03-09 DIAGNOSIS — J449 Chronic obstructive pulmonary disease, unspecified: Secondary | ICD-10-CM

## 2017-03-09 DIAGNOSIS — F17210 Nicotine dependence, cigarettes, uncomplicated: Secondary | ICD-10-CM | POA: Diagnosis not present

## 2017-03-09 LAB — PULMONARY FUNCTION TEST
DL/VA % PRED: 74 %
DL/VA: 3.34 ml/min/mmHg/L
DLCO cor % pred: 57 %
DLCO cor: 16.54 ml/min/mmHg
DLCO unc % pred: 56 %
DLCO unc: 16.4 ml/min/mmHg
FEF 25-75 Post: 0.72 L/sec
FEF 25-75 Pre: 0.75 L/sec
FEF2575-%CHANGE-POST: -4 %
FEF2575-%PRED-POST: 24 %
FEF2575-%Pred-Pre: 25 %
FEV1-%CHANGE-POST: 1 %
FEV1-%PRED-PRE: 47 %
FEV1-%Pred-Post: 48 %
FEV1-PRE: 1.63 L
FEV1-Post: 1.65 L
FEV1FVC-%CHANGE-POST: 3 %
FEV1FVC-%Pred-Pre: 68 %
FEV6-%CHANGE-POST: -2 %
FEV6-%PRED-PRE: 71 %
FEV6-%Pred-Post: 69 %
FEV6-PRE: 3.05 L
FEV6-Post: 2.97 L
FEV6FVC-%Change-Post: 0 %
FEV6FVC-%PRED-PRE: 100 %
FEV6FVC-%Pred-Post: 100 %
FVC-%Change-Post: -2 %
FVC-%PRED-POST: 68 %
FVC-%PRED-PRE: 70 %
FVC-POST: 3.08 L
FVC-PRE: 3.15 L
POST FEV6/FVC RATIO: 97 %
PRE FEV6/FVC RATIO: 97 %
Post FEV1/FVC ratio: 54 %
Pre FEV1/FVC ratio: 52 %
RV % PRED: 115 %
RV: 2.34 L
TLC % pred: 90 %
TLC: 5.84 L

## 2017-03-09 NOTE — Patient Instructions (Addendum)
Plan A = Automatic = Stiolto 2 pffs each am    Plan B = Backup Only use your albuterol as a rescue medication to be used if you can't catch your breath by resting or doing a relaxed purse lip breathing pattern.  - The less you use it, the better it will work when you need it. - Ok to use the inhaler up to 2 puffs  every 4 hours if you must but call for appointment if use goes up over your usual need - Don't leave home without it !!  (think of it like the spare tire for your car)    The key is to stop smoking completely before smoking completely stops you!    Please schedule a follow up visit in 6  months but call sooner if needed

## 2017-03-09 NOTE — Progress Notes (Signed)
PFT done today. 

## 2017-03-09 NOTE — Progress Notes (Signed)
Subjective:     Patient ID: Cody Grant, male   DOB: November 14, 1960,    MRN: [id number]     Brief patient profile:  51 yowm active smoker with GOLD II copd with large reversible component 2015 referred to pulmonary clinic 01/25/2017 by Dr   Cody Grant   01/25/2017 1st [facility] office visit/ Cody Grant   Chief Complaint  Patient presents with  . Pulmonary Consult    Referred by Dr. Cathlean Grant. Pt dxed with COPD in 2016. He states he has trouble with his breathing with hot/humid weather. He tends to have trouble in the am's, and has a non prod cough. He states he went a period of time without his spiriva due to cost and breathing got worse, but has improved since he started this back recently. He uses proair and also albuterol neb both 1 x per wk on average.   limited more by L radicular back  pain than   sob maint on spiriva dpi which he assoc with upper airway pattern coughing  Recurrent acute cough flares 2 x year then needs neb more then but usually not needing  Much saba unless he overdoes it out in the heat   rec Plan A = Automatic = Stiolto 2 pffs each am  Work on inhaler technique:  relax and gently blow all the way out then take a nice smooth deep breath back in, triggering the inhaler at same time you start breathing in.  Hold for up to 5 seconds if you can. Blow out thru nose. Rinse and gargle with water when done Plan B = Backup Only use your albuterol as a rescue medication Plan C = Crisis - only use your albuterol nebulizer if you first try Plan B and it fails to help > ok to use the nebulizer up to every 4 hours but if start needing it regularly call for immediate appointment     03/09/2017  f/u ov/Cody Grant re:  Copd GOLD III // still smoking 7 cigs per day / not using stiolto consistently  Chief Complaint  Patient presents with  . Follow-up    PFT's done today.  He states that he has good days and bad days. He is using his proair 4 x per wk on average.    lot of cough/gagging worse  at hs but usually sleeps/wakes ok  then after coffee starts back up  Cody Grant = can't walk a nl pace on a flat grade s sob but does fine slow and flat eg walking flat/slow  Only started stiolto a few days prior to OV  Since wanted to use up his spiriva supply first   No obvious day to day or daytime variability or assoc excess/ purulent sputum or mucus plugs or hemoptysis or cp or chest tightness, subjective wheeze or overt sinus or hb symptoms. No unusual exp hx or h/o childhood pna/ asthma or knowledge of premature birth.  Sleeping ok flat without nocturnal  exacerbation  of respiratory  c/o's or need for noct saba. Also denies any obvious fluctuation of symptoms with weather or environmental changes or other aggravating or alleviating factors except as outlined above   Current Allergies, Complete Past Medical History, Past Surgical History, Family History, and Social History were reviewed in [employer] record.   ROS  The following are not active complaints unless bolded Hoarseness, sore throat, dysphagia, dental problems, itching, sneezing,  nasal congestion or discharge of excess mucus or purulent secretions, ear ache,   fever,  chills, sweats, unintended wt loss or wt gain, classically pleuritic or exertional cp,  orthopnea pnd or leg swelling, presyncope, palpitations, abdominal pain, anorexia, nausea, vomiting, diarrhea  or change in bowel habits or change in bladder habits, change in stools or change in urine, dysuria, hematuria,  rash, arthralgias, visual complaints, headache, numbness, weakness or ataxia or problems with walking or coordination,  change in mood/affect or memory.        Current Meds  Medication Sig  . albuterol (ACCUNEB) 0.63 MG/3ML nebulizer solution Take 3 mLs (0.63 mg total) by nebulization every 6 (six) hours as needed for wheezing.  Marland Kitchen albuterol (PROAIR HFA) 108 (90 Base) MCG/ACT inhaler INHALE 2 PUFFS INTO THE LUNGS EVERY 6 HOURS AS NEEDED FOR  WHEEZING OR SHORTNESS OF BREATH.  Marland Kitchen diclofenac (FLECTOR) 1.3 % PTCH Place 1 patch onto the skin as needed.  Marland Kitchen ibuprofen (ADVIL,MOTRIN) 400 MG tablet Take 1 tablet (400 mg total) by mouth every 8 (eight) hours as needed for pain.  Marland Kitchen NAPROXEN PO Take 1 tablet by mouth as directed.  Marland Kitchen oxyCODONE (OXY IR/ROXICODONE) 5 MG immediate release tablet Take 1 tablet (5 mg total) by mouth every 8 (eight) hours as needed. May take an extra tablet for severe pain occasionally  . sildenafil (VIAGRA) 100 MG tablet Take 0.5-1 tablets (50-100 mg total) by mouth daily as needed for erectile dysfunction.  Marland Kitchen SPIRIVA HANDIHALER 18 MCG inhalation capsule Place 1 capsule daily into inhaler and inhale.  Marland Kitchen tiZANidine (ZANAFLEX) 4 MG tablet TAKE 1 TABLET(4 MG) BY MOUTH EVERY 8 HOURS AS NEEDED FOR MUSCLE SPASMS                      Objective:   Physical Exam    amb hoarse wm with occ throat clearing   Wt Readings from Last 3 Encounters:  01/25/17 167 lb 6.4 oz (75.9 kg)  12/19/16 165 lb (74.8 kg)  08/02/16 167 lb 2 oz (75.8 kg)    Vital signs reviewed    HEENT: nl   turbinates bilaterally, and oropharynx.  ear canals without cough reflex but severe wax  - poor dentition   NECK :  without JVD/Nodes/TM/ nl carotid upstrokes bilaterally   LUNGS: no acc muscle use,  slt barrel chest with distant late exp wheeze bilaterally better with plm   CV:  RRR  no s3 or murmur or increase in P2, and no edema   ABD:  soft and nontender with nl inspiratory excursion in the supine position. No bruits or organomegaly appreciated, bowel sounds nl  MS:  Nl gait/ ext warm without deformities, calf tenderness, cyanosis or clubbing No obvious joint restrictions   SKIN: warm and dry without lesions    NEURO:  alert, approp, nl sensorium with  no motor or cerebellar deficits apparent.    CXR PA and Lateral:   01/25/2017 :    I personally reviewed images and agree with radiology impression as follows:   No acute  cardiopulmonary disease.      Assessment:

## 2017-03-10 ENCOUNTER — Encounter: Payer: Self-pay | Admitting: Internal Medicine

## 2017-03-10 NOTE — Assessment & Plan Note (Addendum)
>   3 min discussion I reviewed the Fletcher curve with the patient that basically indicates  if you quit smoking when your best day FEV1 is still  preserved (as only relatively true here)  it is highly unlikely you will progress to severe disease and informed the patient there was  no medication on the market that has proven to alter the curve/ its downward trajectory  or the likelihood of progression of their disease(unlike other chronic medical conditions such as atheroclerosis where we do think we can change the natural hx with risk reducing meds)    Therefore stopping smoking and maintaining abstinence are the most important aspects of care, not choice of inhalers or for that matter, doctors.    rx is for symptoms and to prevention exacerbations so consider LABA/LAMA / ICS if has flares on this rx

## 2017-03-10 NOTE — Assessment & Plan Note (Signed)
PFT's   05/20/2013   FEV1 1.95 (54 % ) ratio 59  p 39 % improvement from saba p ? prior to study with DLCO  62 % corrects to 85  % for alv volume   - 01/25/2017  After extensive coaching device effectiveness =   90% with hfa and smi > try stiolto 2 each am   - PFT's  03/09/2017  FEV1 1.65 (48 % ) ratio 54  p 1 % improvement from saba p stiolto  prior to study with DLCO  56/57 % corrects to 74 % for alv volume    Pt is Group B in terms of symptom/risk and laba/lama therefore appropriate rx at this point and hasn't really given the stiolto a fair try yet.   I had an extended discussion with the patient reviewing all relevant studies completed to date and  lasting 15 to 20 minutes of a 25 minute visit    Each maintenance medication was reviewed in detail including most importantly the difference between maintenance and prns and under what circumstances the prns are to be triggered using an action plan format that is not reflected in the computer generated alphabetically organized AVS.    Please see AVS for specific instructions unique to this visit that I personally wrote and verbalized to the the pt in detail and then reviewed with pt  by my nurse highlighting any  changes in therapy recommended at today's visit to their plan of care.

## 2017-03-30 DIAGNOSIS — G894 Chronic pain syndrome: Secondary | ICD-10-CM | POA: Diagnosis not present

## 2017-03-30 DIAGNOSIS — M545 Low back pain: Secondary | ICD-10-CM | POA: Diagnosis not present

## 2017-03-30 DIAGNOSIS — Z79891 Long term (current) use of opiate analgesic: Secondary | ICD-10-CM | POA: Diagnosis not present

## 2017-05-09 DIAGNOSIS — G894 Chronic pain syndrome: Secondary | ICD-10-CM | POA: Diagnosis not present

## 2017-05-09 DIAGNOSIS — Z79891 Long term (current) use of opiate analgesic: Secondary | ICD-10-CM | POA: Diagnosis not present

## 2017-05-09 DIAGNOSIS — M545 Low back pain: Secondary | ICD-10-CM | POA: Diagnosis not present

## 2017-06-06 DIAGNOSIS — G894 Chronic pain syndrome: Secondary | ICD-10-CM | POA: Diagnosis not present

## 2017-06-06 DIAGNOSIS — M545 Low back pain: Secondary | ICD-10-CM | POA: Diagnosis not present

## 2017-06-06 DIAGNOSIS — Z79891 Long term (current) use of opiate analgesic: Secondary | ICD-10-CM | POA: Diagnosis not present

## 2017-06-21 ENCOUNTER — Ambulatory Visit (INDEPENDENT_AMBULATORY_CARE_PROVIDER_SITE_OTHER): Payer: Medicare HMO | Admitting: Internal Medicine

## 2017-06-21 ENCOUNTER — Encounter: Payer: Self-pay | Admitting: Internal Medicine

## 2017-06-21 VITALS — BP 102/68 | HR 92 | Temp 98.2°F | Ht 67.5 in | Wt 169.0 lb

## 2017-06-21 DIAGNOSIS — Z0000 Encounter for general adult medical examination without abnormal findings: Secondary | ICD-10-CM | POA: Diagnosis not present

## 2017-06-21 DIAGNOSIS — R809 Proteinuria, unspecified: Secondary | ICD-10-CM | POA: Diagnosis not present

## 2017-06-21 NOTE — Assessment & Plan Note (Signed)
Mild, noted on last UA, for f/u UA today, consider 24 urine prot

## 2017-06-21 NOTE — Assessment & Plan Note (Signed)

## 2017-06-21 NOTE — Progress Notes (Signed)
Subjective:    Patient ID: Cody Grant, male    DOB: 1960/08/12, 57 y.o.   MRN: [id number]  HPI  Here for wellness and f/u;  Overall doing ok;  Pt denies Chest pain, worsening SOB, DOE, wheezing, orthopnea, PND, worsening LE edema, palpitations, dizziness or syncope.  Pt denies neurological change such as new headache, facial or extremity weakness.  Pt denies polydipsia, polyuria, or low sugar symptoms. Pt states overall good compliance with treatment and medications, good tolerability, and has been trying to follow appropriate diet.  Pt denies worsening depressive symptoms, suicidal ideation or panic. No fever, night sweats, wt loss, loss of appetite, or other constitutional symptoms.  Pt states good ability with ADL's, has low fall risk, home safety reviewed and adequate, no other significant changes in hearing or vision, and only occasionally active with exercise.  Declines Tdap and flu shot.  Never started the statin, does not want this.  No other interval hx or complaints Past Medical History:  Diagnosis Date  . Allergic rhinitis, cause unspecified   . Asthma    as child  . Chronic lumbar radiculopathy    left with left drop foot/numbness  . Chronic pain    to see Dr Delynn Flavin  . COPD, severe ([place]) 06/22/2013  . Factor 5 Leiden mutation, heterozygous ([place])   . Left foot drop   . Lumbar disc disease   . Other and unspecified hyperlipidemia 05/20/2013   Past Surgical History:  Procedure Laterality Date  . SPINE SURGERY  12/2010   micro diskectomy    reports that he has been smoking cigarettes.  He has a 30.00 pack-year smoking history. he has never used smokeless tobacco. He reports that he drinks about 1.2 - 2.4 oz of alcohol per week. He reports that he does not use drugs. family history includes COPD in his mother; Diabetes in his mother; Pulmonary embolism in his father. No Known Allergies Current Outpatient Medications on File Prior to Visit  Medication Sig Dispense Refill  .  albuterol (ACCUNEB) 0.63 MG/3ML nebulizer solution Take 3 mLs (0.63 mg total) by nebulization every 6 (six) hours as needed for wheezing. 75 mL 12  . albuterol (PROAIR HFA) 108 (90 Base) MCG/ACT inhaler INHALE 2 PUFFS INTO THE LUNGS EVERY 6 HOURS AS NEEDED FOR WHEEZING OR SHORTNESS OF BREATH. 8.5 g 11  . diclofenac (FLECTOR) 1.3 % PTCH Place 1 patch onto the skin as needed. 30 patch 2  . ibuprofen (ADVIL,MOTRIN) 400 MG tablet Take 1 tablet (400 mg total) by mouth every 8 (eight) hours as needed for pain. 90 tablet 1  . NAPROXEN PO Take 1 tablet by mouth as directed.    Marland Kitchen oxyCODONE (OXY IR/ROXICODONE) 5 MG immediate release tablet Take 1 tablet (5 mg total) by mouth every 8 (eight) hours as needed. May take an extra tablet for severe pain occasionally 100 tablet 0  . sildenafil (VIAGRA) 100 MG tablet Take 0.5-1 tablets (50-100 mg total) by mouth daily as needed for erectile dysfunction. 5 tablet 11  . Tiotropium Bromide-Olodaterol (STIOLTO RESPIMAT) 2.5-2.5 MCG/ACT AERS Inhale 2 puffs into the lungs daily. 1 Inhaler 11  . tiZANidine (ZANAFLEX) 4 MG tablet TAKE 1 TABLET(4 MG) BY MOUTH EVERY 8 HOURS AS NEEDED FOR MUSCLE SPASMS 90 tablet 0   No current facility-administered medications on file prior to visit.    Review of Systems Constitutional: Negative for other unusual diaphoresis, sweats, appetite or weight changes HENT: Negative for other worsening hearing loss, ear pain, facial swelling,  mouth sores or neck stiffness.   Eyes: Negative for other worsening pain, redness or other visual disturbance.  Respiratory: Negative for other stridor or swelling Cardiovascular: Negative for other palpitations or other chest pain  Gastrointestinal: Negative for worsening diarrhea or loose stools, blood in stool, distention or other pain Genitourinary: Negative for hematuria, flank pain or other change in urine volume.  Musculoskeletal: Negative for myalgias or other joint swelling.  Skin: Negative for other  color change, or other wound or worsening drainage.  Neurological: Negative for other syncope or numbness. Hematological: Negative for other adenopathy or swelling Psychiatric/Behavioral: Negative for hallucinations, other worsening agitation, SI, self-injury, or new decreased concentration All other system neg per pt    Objective:   Physical Exam BP 102/68   Pulse 92   Temp 98.2 F (36.8 C) (Oral)   Ht 5' 7.5" (1.715 m)   Wt 169 lb (76.7 kg)   SpO2 97%   BMI 26.08 kg/m  VS noted,  Constitutional: Pt is oriented to person, place, and time. Appears well-developed and well-nourished, in no significant distress and comfortable Head: Normocephalic and atraumatic  Eyes: Conjunctivae and EOM are normal. Pupils are equal, round, and reactive to light Right Ear: External ear normal without discharge Left Ear: External ear normal without discharge Nose: Nose without discharge or deformity Mouth/Throat: Oropharynx is without other ulcerations and moist  Neck: Normal range of motion. Neck supple. No JVD present. No tracheal deviation present or significant neck LA or mass Cardiovascular: Normal rate, regular rhythm, normal heart sounds and intact distal pulses.   Pulmonary/Chest: WOB normal and breath sounds without rales or wheezing  Abdominal: Soft. Bowel sounds are normal. NT. No HSM  Musculoskeletal: Normal range of motion. Exhibits no edema Lymphadenopathy: Has no other cervical adenopathy.  Neurological: Pt is alert and oriented to person, place, and time. Pt has normal reflexes. No cranial nerve deficit. Motor grossly intact, Gait intact Skin: Skin is warm and dry. No rash noted or new ulcerations Psychiatric:  Has normal mood and affect. Behavior is normal without agitation No other exam findings    Assessment & Plan:

## 2017-06-21 NOTE — Patient Instructions (Signed)

## 2017-07-04 DIAGNOSIS — M545 Low back pain: Secondary | ICD-10-CM | POA: Diagnosis not present

## 2017-07-04 DIAGNOSIS — Z79891 Long term (current) use of opiate analgesic: Secondary | ICD-10-CM | POA: Diagnosis not present

## 2017-07-04 DIAGNOSIS — G894 Chronic pain syndrome: Secondary | ICD-10-CM | POA: Diagnosis not present

## 2017-08-01 DIAGNOSIS — G894 Chronic pain syndrome: Secondary | ICD-10-CM | POA: Diagnosis not present

## 2017-08-01 DIAGNOSIS — Z79891 Long term (current) use of opiate analgesic: Secondary | ICD-10-CM | POA: Diagnosis not present

## 2017-08-01 DIAGNOSIS — M545 Low back pain: Secondary | ICD-10-CM | POA: Diagnosis not present

## 2017-08-18 IMAGING — DX DG LUMBAR SPINE COMPLETE 4+V
5 series · 5 of 5 positions shown · non-contrast
Comparison: None.

CLINICAL DATA: Lumbar radiculitis with worsening for about 3 weeks,
no known injury

EXAM:
LUMBAR SPINE - COMPLETE 4+ VIEW

[l-spine ap]
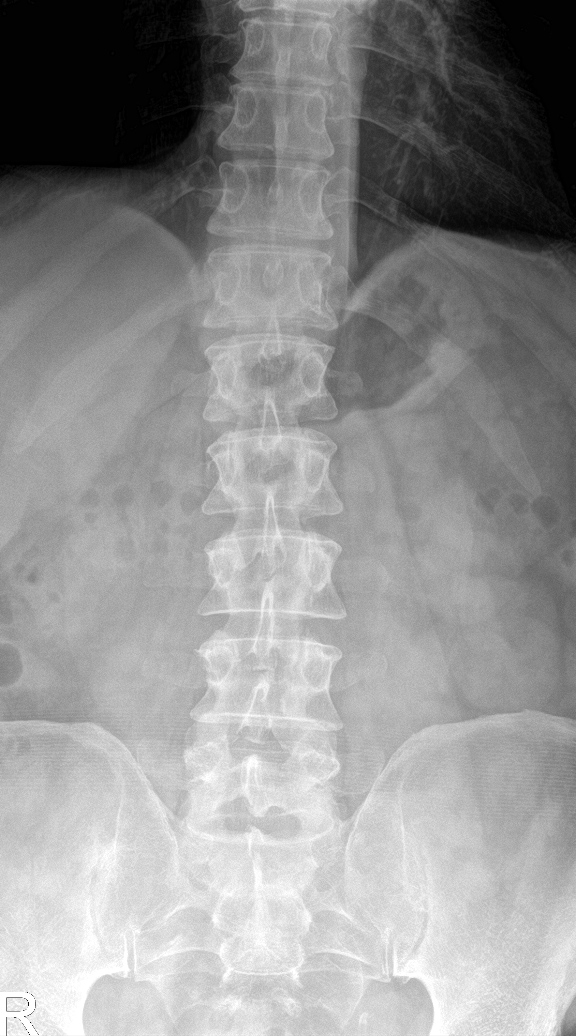

[l-spine obl (1 of 2)]
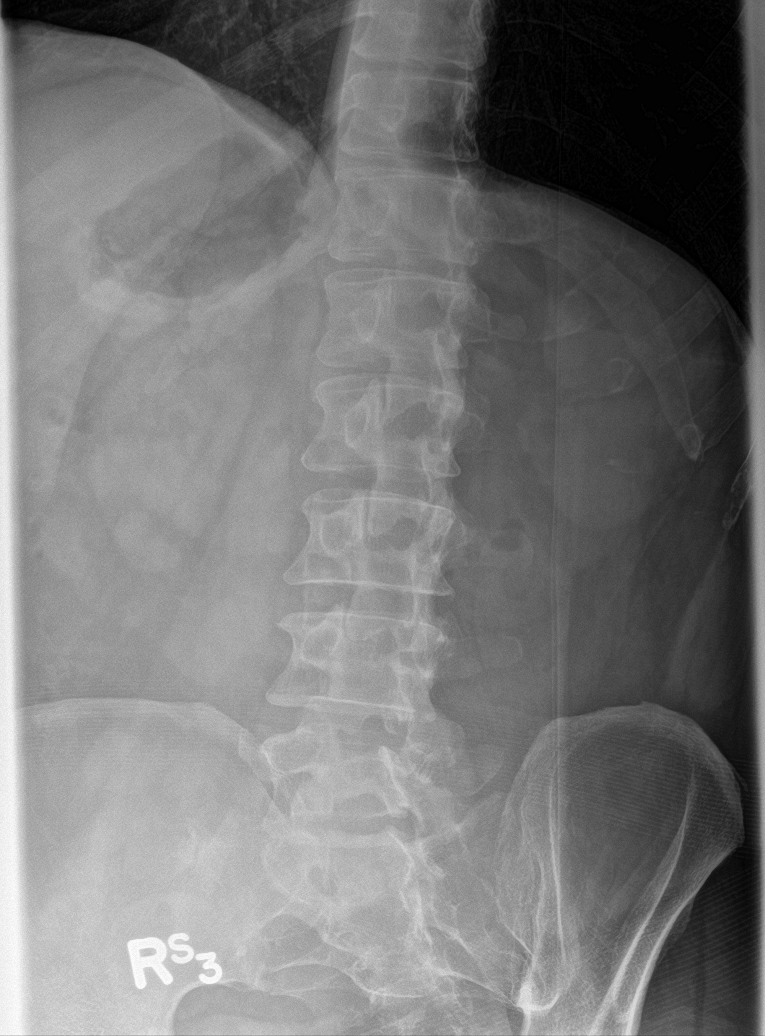

[l-spine obl (2 of 2)]
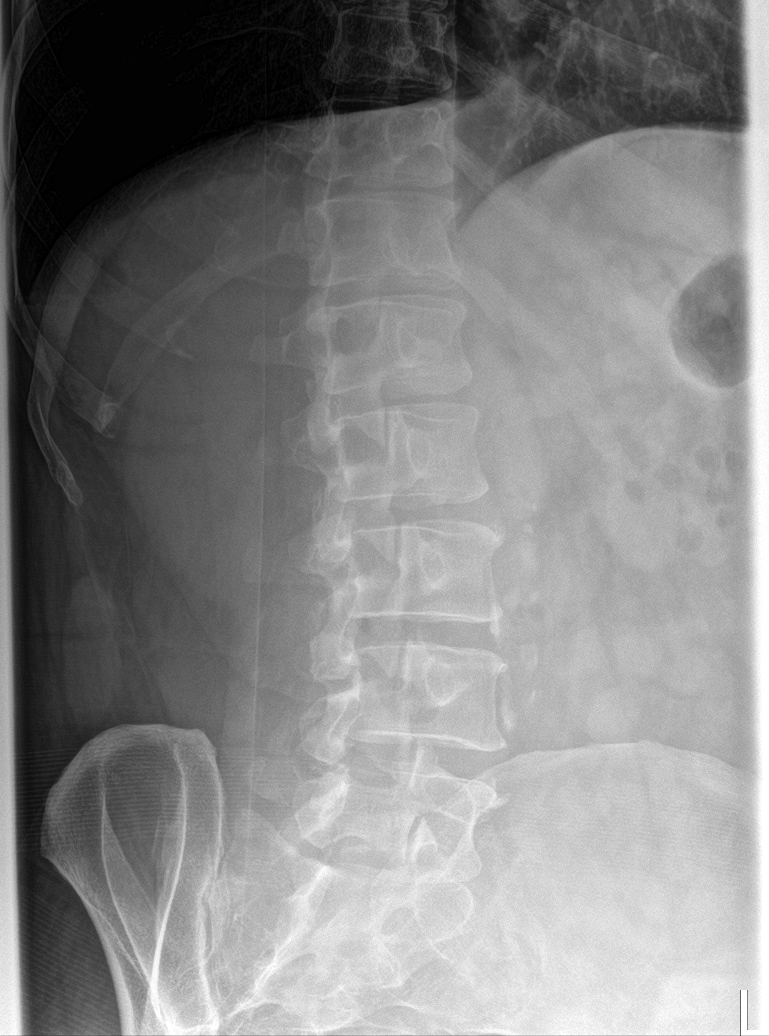

[l-spine lat]
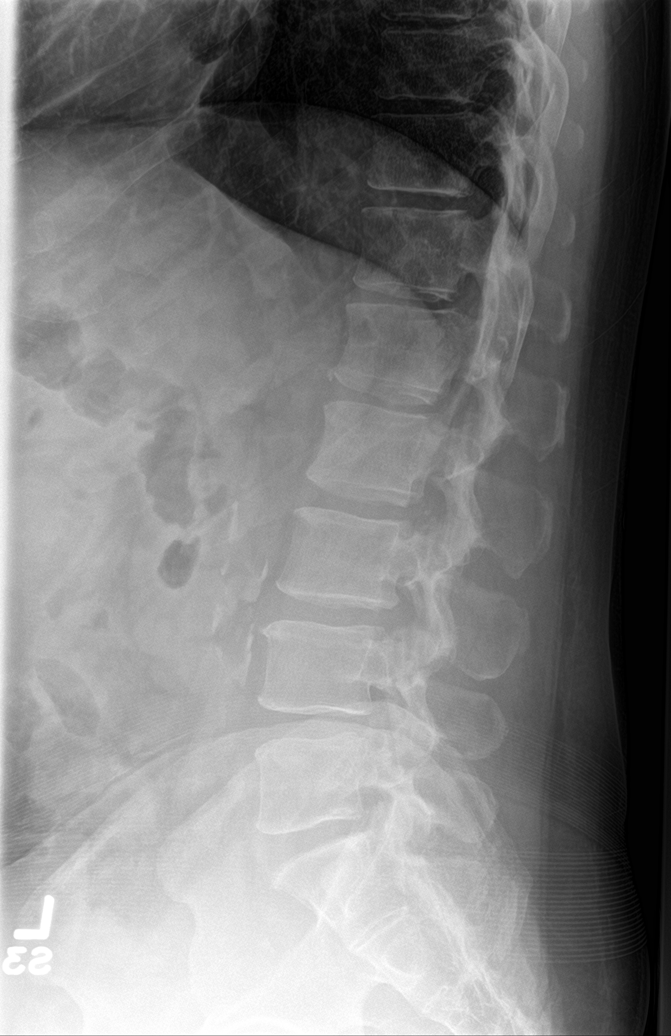

[l-spine spot]
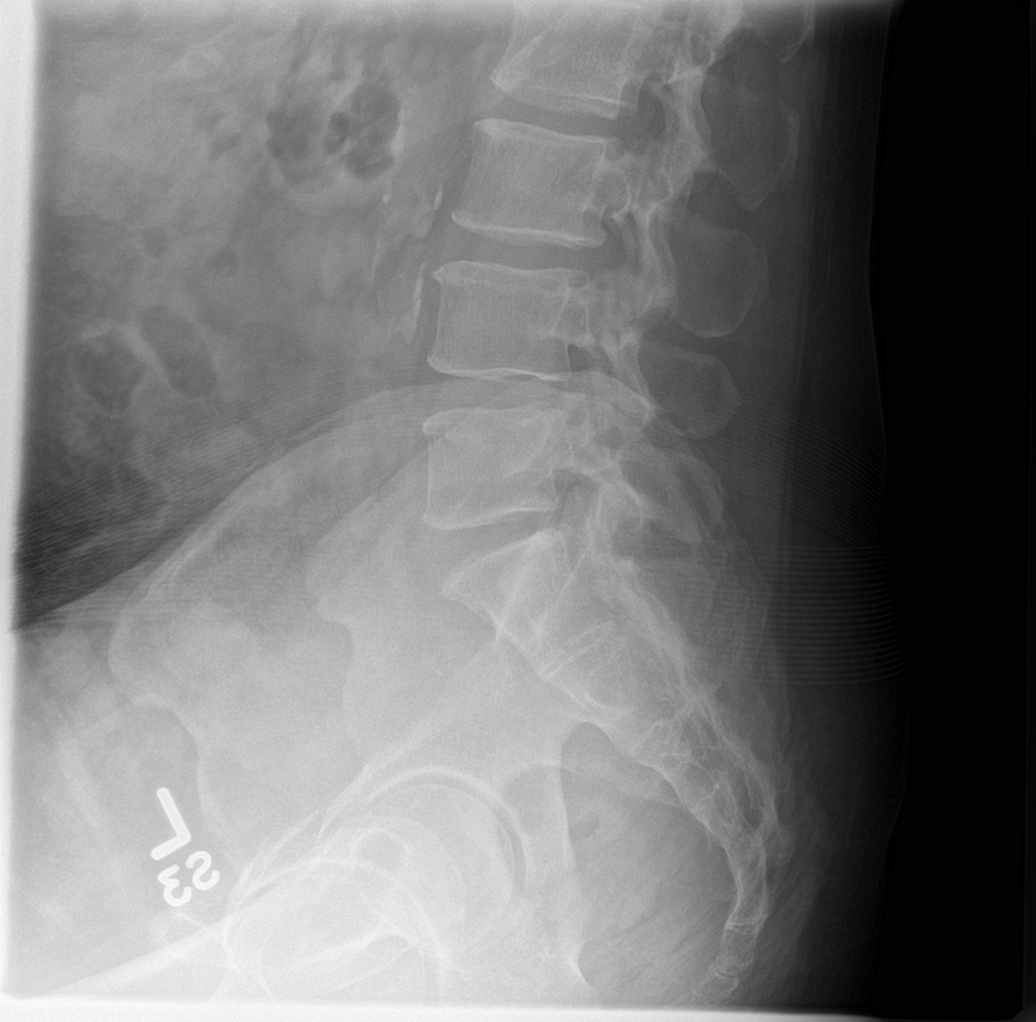

[5 of 5 positions shown; findings below may reference images not displayed]

FINDINGS: Five views of the lumbar spine submitted. No acute fracture or
subluxation. Minimal anterior spurring upper endplate of L4 and L5
vertebral body. Alignment, disc spaces and vertebral body heights
are preserved. Mild atherosclerotic calcifications of abdominal
aorta.
IMPRESSION: 1. No acute fracture or subluxation. Minimal degenerative changes
with anterior spurring upper endplate of L4 and L5 vertebral body.
Alignment, disc spaces and vertebral body heights are preserved.

## 2017-08-29 DIAGNOSIS — G894 Chronic pain syndrome: Secondary | ICD-10-CM | POA: Diagnosis not present

## 2017-08-29 DIAGNOSIS — Z79891 Long term (current) use of opiate analgesic: Secondary | ICD-10-CM | POA: Diagnosis not present

## 2017-08-29 DIAGNOSIS — M545 Low back pain: Secondary | ICD-10-CM | POA: Diagnosis not present

## 2017-09-06 ENCOUNTER — Ambulatory Visit: Payer: Medicare HMO | Admitting: Internal Medicine

## 2017-09-06 ENCOUNTER — Encounter: Payer: Self-pay | Admitting: Internal Medicine

## 2017-09-06 VITALS — BP 116/74 | HR 76 | Ht 67.5 in | Wt 163.0 lb

## 2017-09-06 DIAGNOSIS — J449 Chronic obstructive pulmonary disease, unspecified: Secondary | ICD-10-CM

## 2017-09-06 DIAGNOSIS — F17210 Nicotine dependence, cigarettes, uncomplicated: Secondary | ICD-10-CM

## 2017-09-06 MED ORDER — PREDNISONE 10 MG PO TABS: ORAL_TABLET | ORAL | 0 refills | Status: DC

## 2017-09-06 NOTE — Patient Instructions (Addendum)
Plan A = Automatic = Stiolto 2 pffs each am   Plan B = Backup Only use your albuterol (proair) as a rescue medication to be used if you can't catch your breath by resting or doing a relaxed purse lip breathing pattern.  - The less you use it, the better it will work when you need it. - Ok to use the inhaler up to 2 puffs  every 4 hours if you must but call for appointment if use goes up over your usual need - Don't leave home without it !!  (think of it like the spare tire for your car)   Plan C = Crisis - only use your albuterol nebulizer if you first try Plan B and it fails to help > ok to use the nebulizer up to every 4 hours but if start needing it regularly call for immediate appointment   Plan D = Doctor When you start noticing more symptoms or need for albuterol :  Prednisone 10 mg take  4 each am x 2 days,   2 each am x 2 days,  1 each am x 2 days and stop   The key is to stop smoking completely before smoking completely stops you!    Please schedule a follow up visit in 6 months but call sooner if needed

## 2017-09-06 NOTE — Progress Notes (Signed)
Subjective:     Patient ID: Cody Grant, male   DOB: 1960/09/11,    MRN: [id number]     Brief patient profile:  72 yowm active smoker with GOLD II copd with large reversible component 2015 referred to pulmonary clinic 01/25/2017 by Dr   Jenny Reichmann    History of Present Illness  01/25/2017 1st [facility] office visit/ Cody Grant   Chief Complaint  Patient presents with  . Pulmonary Consult    Referred by Dr. Cathlean Grant. Pt dxed with COPD in 2016. He states he has trouble with his breathing with hot/humid weather. He tends to have trouble in the am's, and has a non prod cough. He states he went a period of time without his spiriva due to cost and breathing got worse, but has improved since he started this back recently. He uses proair and also albuterol neb both 1 x per wk on average.   limited more by L radicular back  pain than   sob maint on spiriva dpi which he assoc with upper airway pattern coughing  Recurrent acute cough flares 2 x year then needs neb more then but usually not needing  Much saba unless he overdoes it out in the heat   rec Plan A = Automatic = Stiolto 2 pffs each am  Work on inhaler technique:  relax and gently blow all the way out then take a nice smooth deep breath back in, triggering the inhaler at same time you start breathing in.  Hold for up to 5 seconds if you can. Blow out thru nose. Rinse and gargle with water when done Plan B = Backup Only use your albuterol as a rescue medication Plan C = Crisis - only use your albuterol nebulizer if you first try Plan B and it fails to help > ok to use the nebulizer up to every 4 hours but if start needing it regularly call for immediate appointment     03/09/2017  f/u ov/[name] re:  Copd GOLD III // still smoking 7 cigs per day / not using stiolto consistently  Chief Complaint  Patient presents with  . Follow-up    PFT's done today.  He states that he has good days and bad days. He is using his proair 4 x per wk on average.     lot of cough/gagging worse at hs but usually sleeps/wakes ok  then after coffee starts back up  [facility] = can't walk a nl pace on a flat grade s sob but does fine slow and flat eg walking flat/slow  Only started stiolto a few days prior to OV  Since wanted to use up his spiriva supply first  rec Plan A = Automatic = Stiolto 2 pffs each am  Plan B = Backup Only use your albuterol as a rescue medication The key is to stop smoking completely before smoking completely stops you!      09/06/2017  f/u ov/Cody Grant re:  GOLD III/ still smoking  Chief Complaint  Patient presents with  . Follow-up    Breathing had been worse here recently- relates to the pollen. He states that this happens every Spring. He rarely uses his albuterol inhaler but he has been using neb about 2 x per day.   Dyspnea:  Still = MMRC2 = can't walk a nl pace on a flat grade s sob but does fine slow and flat  Cough: mostly dry  Sleep: sleep ok once gets to  SABA use:  Was using  a lot with"pollen" including nebs, not now  Wake ups to coffee and cig daily then starts coughing mostly dry  Also cough immediately  at hs  But not waking him up noct or in am    No obvious day to day or daytime variability or assoc excess/ purulent sputum or mucus plugs or hemoptysis or cp or chest tightness, subjective wheeze or overt sinus or hb symptoms. No unusual exposure hx or h/o childhood pna/ asthma or knowledge of premature birth.  Sleeping  Ok one pillow once he gets to sleep after initial cough  without nocturnal  or early am exacerbation  of respiratory  c/o's or need for noct saba. Also denies any obvious fluctuation of symptoms with weather or environmental changes or other aggravating or alleviating factors except as outlined above   Current Allergies, Complete Past Medical History, Past Surgical History, Family History, and Social History were reviewed in [employer] record.  ROS  The following are not active  complaints unless bolded Hoarseness, sore throat, dysphagia, dental problems, itching, sneezing,  nasal congestion or discharge of excess mucus or purulent secretions, ear ache,   fever, chills, sweats, unintended wt loss or wt gain, classically pleuritic or exertional cp,  orthopnea pnd or arm/hand swelling  or leg swelling, presyncope, palpitations, abdominal pain, anorexia, nausea, vomiting, diarrhea  or change in bowel habits or change in bladder habits, change in stools or change in urine, dysuria, hematuria,  rash, arthralgias, visual complaints, headache, numbness, weakness or ataxia or problems with walking or coordination,  change in mood or  memory.        Current Meds  Medication Sig  . albuterol (ACCUNEB) 0.63 MG/3ML nebulizer solution Take 3 mLs (0.63 mg total) by nebulization every 6 (six) hours as needed for wheezing.  Cody Grant albuterol (PROAIR HFA) 108 (90 Base) MCG/ACT inhaler INHALE 2 PUFFS INTO THE LUNGS EVERY 6 HOURS AS NEEDED FOR WHEEZING OR SHORTNESS OF BREATH.  Cody Grant ibuprofen (ADVIL,MOTRIN) 400 MG tablet Take 1 tablet (400 mg total) by mouth every 8 (eight) hours as needed for pain.  Cody Grant NAPROXEN PO Take 1 tablet by mouth as directed.  Cody Grant oxyCODONE (OXY IR/ROXICODONE) 5 MG immediate release tablet Take 1 tablet (5 mg total) by mouth every 8 (eight) hours as needed. May take an extra tablet for severe pain occasionally  . sildenafil (VIAGRA) 100 MG tablet Take 0.5-1 tablets (50-100 mg total) by mouth daily as needed for erectile dysfunction.  . Tiotropium Bromide-Olodaterol (STIOLTO RESPIMAT) 2.5-2.5 MCG/ACT AERS Inhale 2 puffs into the lungs daily.  Cody Grant tiZANidine (ZANAFLEX) 4 MG tablet TAKE 1 TABLET(4 MG) BY MOUTH EVERY 8 HOURS AS NEEDED FOR MUSCLE SPASMS                            Objective:   Physical Exam  amb wm nad   09/06/2017          163   01/25/17 167 lb 6.4 oz (75.9 kg)  12/19/16 165 lb (74.8 kg)  08/02/16 167 lb 2 oz (75.8 kg)     Vital signs reviewed -  Note on arrival 02 sats  93% on RA     09/06/2017' Wax L >>R    Mid exp wheeze bilaterally better with plm   HEENT: nl dentition, turbinates bilaterally, and oropharynx.  external ear canals  Wax L >R without cough reflex   NECK :  without JVD/Nodes/TM/ nl carotid upstrokes bilaterally  LUNGS: no acc muscle use,  Nl contour chest with mid exp wheezing bilaterally without cough on insp or exp maneuvers   CV:  RRR  no s3 or murmur or increase in P2, and no edema   ABD:  soft and nontender with nl inspiratory excursion in the supine position. No bruits or organomegaly appreciated, bowel sounds nl  MS:  Nl gait/ ext warm without deformities, calf tenderness, cyanosis or clubbing No obvious joint restrictions   SKIN: warm and dry without lesions    NEURO:  alert, approp, nl sensorium with  no motor or cerebellar deficits apparent.             Assessment:

## 2017-09-07 ENCOUNTER — Encounter: Payer: Self-pay | Admitting: Internal Medicine

## 2017-09-07 NOTE — Assessment & Plan Note (Signed)
PFT's   05/20/2013   FEV1 1.95 (54 % ) ratio 59  p 39 % improvement from saba p ? prior to study with DLCO  62 % corrects to 85  % for alv volume   - 01/25/2017    try stiolto 2 each am  - PFT's  03/09/2017  FEV1 1.65 (48 % ) ratio 54  p 1 % improvement from saba p stiolto  prior to study with DLCO  56/57 % corrects to 74 % for alv volume   - 09/06/2017  After extensive coaching inhaler device  effectiveness =    90%   Mild flare with pollen exposure/ still smoking against medical advise  (see separate a/p)   rec prednisone x 6 days as "plan D for flares" and consider adding back ICS as pt may be developing a pattern more of a Group D pt esp if finds prednisone short course really helps   I had an extended discussion with the patient reviewing all relevant studies completed to date and  lasting 15 to 20 minutes of a 25 minute visit    See device teaching which extended face to face time for this visit   Each maintenance medication was reviewed in detail including most importantly the difference between maintenance and prns and under what circumstances the prns are to be triggered using an action plan format that is not reflected in the computer generated alphabetically organized AVS.    Please see AVS for specific instructions unique to this visit that I personally wrote and verbalized to the the pt in detail and then reviewed with pt  by my nurse highlighting any  changes in therapy recommended at today's visit to their plan of care.

## 2017-09-07 NOTE — Assessment & Plan Note (Signed)
4-5 min discussion re active cigarette smoking in addition to office E&M  Ask about tobacco use:  ongoing Advise quitting     Discussed the risks and costs (both direct and indirect)  of smoking relative to the benefits of quitting but patient unwilling to commit at this point to a specific quit date  .  Assess willingness not there yet  Assist in quit attempt per Dr Jenny Reichmann Arrange follow up. F/u with Dr Jenny Reichmann

## 2017-09-26 DIAGNOSIS — Z79891 Long term (current) use of opiate analgesic: Secondary | ICD-10-CM | POA: Diagnosis not present

## 2017-09-26 DIAGNOSIS — M545 Low back pain: Secondary | ICD-10-CM | POA: Diagnosis not present

## 2017-09-26 DIAGNOSIS — G894 Chronic pain syndrome: Secondary | ICD-10-CM | POA: Diagnosis not present

## 2017-10-22 DIAGNOSIS — Z79891 Long term (current) use of opiate analgesic: Secondary | ICD-10-CM | POA: Diagnosis not present

## 2017-10-22 DIAGNOSIS — G894 Chronic pain syndrome: Secondary | ICD-10-CM | POA: Diagnosis not present

## 2017-10-22 DIAGNOSIS — M545 Low back pain: Secondary | ICD-10-CM | POA: Diagnosis not present

## 2017-11-26 DIAGNOSIS — M545 Low back pain: Secondary | ICD-10-CM | POA: Diagnosis not present

## 2017-11-26 DIAGNOSIS — G894 Chronic pain syndrome: Secondary | ICD-10-CM | POA: Diagnosis not present

## 2017-11-26 DIAGNOSIS — Z79891 Long term (current) use of opiate analgesic: Secondary | ICD-10-CM | POA: Diagnosis not present

## 2017-12-19 ENCOUNTER — Ambulatory Visit (INDEPENDENT_AMBULATORY_CARE_PROVIDER_SITE_OTHER)
Admission: RE | Admit: 2017-12-19 | Discharge: 2017-12-19 | Disposition: A | Discharge status: Home / Self Care | Payer: Medicare HMO | Source: Ambulatory Visit | Attending: Internal Medicine | Admitting: Internal Medicine

## 2017-12-19 ENCOUNTER — Encounter: Payer: Self-pay | Admitting: Internal Medicine

## 2017-12-19 ENCOUNTER — Ambulatory Visit (INDEPENDENT_AMBULATORY_CARE_PROVIDER_SITE_OTHER): Payer: Medicare HMO | Admitting: Internal Medicine

## 2017-12-19 ENCOUNTER — Other Ambulatory Visit (INDEPENDENT_AMBULATORY_CARE_PROVIDER_SITE_OTHER): Payer: Medicare HMO

## 2017-12-19 VITALS — BP 118/76 | HR 92 | Temp 98.4°F | Ht 67.5 in | Wt 154.0 lb

## 2017-12-19 DIAGNOSIS — G8929 Other chronic pain: Secondary | ICD-10-CM | POA: Diagnosis not present

## 2017-12-19 DIAGNOSIS — J449 Chronic obstructive pulmonary disease, unspecified: Secondary | ICD-10-CM

## 2017-12-19 DIAGNOSIS — F17210 Nicotine dependence, cigarettes, uncomplicated: Secondary | ICD-10-CM | POA: Diagnosis not present

## 2017-12-19 DIAGNOSIS — N320 Bladder-neck obstruction: Secondary | ICD-10-CM

## 2017-12-19 DIAGNOSIS — E785 Hyperlipidemia, unspecified: Secondary | ICD-10-CM | POA: Diagnosis not present

## 2017-12-19 LAB — LIPID PANEL
CHOL/HDL RATIO: 4
CHOLESTEROL: 182 mg/dL (ref 0–200)
HDL: 43.9 mg/dL (ref >39.00)
LDL CALC: 105 mg/dL — AB (ref 0–99)
NonHDL: 138.59
TRIGLYCERIDES: 168 mg/dL — AB (ref 0.0–149.0)
VLDL: 33.6 mg/dL (ref 0.0–40.0)

## 2017-12-19 LAB — HEPATIC FUNCTION PANEL
ALBUMIN: 4.4 g/dL (ref 3.5–5.2)
ALK PHOS: 52 U/L (ref 39–117)
ALT: 11 U/L (ref 0–53)
AST: 12 U/L (ref 0–37)
Bilirubin, Direct: 0.1 mg/dL (ref 0.0–0.3)
TOTAL PROTEIN: 6.9 g/dL (ref 6.0–8.3)
Total Bilirubin: 0.7 mg/dL (ref 0.2–1.2)

## 2017-12-19 LAB — CBC WITH DIFFERENTIAL/PLATELET
BASOS ABS: 0.1 10*3/uL (ref 0.0–0.1)
BASOS PCT: 1.1 % (ref 0.0–3.0)
EOS ABS: 0.2 10*3/uL (ref 0.0–0.7)
Eosinophils Relative: 2.2 % (ref 0.0–5.0)
HEMATOCRIT: 44.5 % (ref 39.0–52.0)
Hemoglobin: 14.9 g/dL (ref 13.0–17.0)
LYMPHS ABS: 1.8 10*3/uL (ref 0.7–4.0)
LYMPHS PCT: 20.8 % (ref 12.0–46.0)
MCHC: 33.6 g/dL (ref 30.0–36.0)
MCV: 92 fl (ref 78.0–100.0)
MONOS PCT: 9.7 % (ref 3.0–12.0)
Monocytes Absolute: 0.9 10*3/uL (ref 0.1–1.0)
NEUTROS ABS: 5.9 10*3/uL (ref 1.4–7.7)
NEUTROS PCT: 66.2 % (ref 43.0–77.0)
PLATELETS: 275 10*3/uL (ref 150.0–400.0)
RBC: 4.84 Mil/uL (ref 4.22–5.81)
RDW: 13.2 % (ref 11.5–15.5)
WBC: 8.9 10*3/uL (ref 4.0–10.5)

## 2017-12-19 LAB — BASIC METABOLIC PANEL
BUN: 12 mg/dL (ref 6–23)
CHLORIDE: 103 meq/L (ref 96–112)
CO2: 29 meq/L (ref 19–32)
Calcium: 9.9 mg/dL (ref 8.4–10.5)
Creatinine, Ser: 0.82 mg/dL (ref 0.40–1.50)
GFR: 102.88 mL/min (ref >60.00)
Glucose, Bld: 97 mg/dL (ref 70–99)
Potassium: 4.8 mEq/L (ref 3.5–5.1)
SODIUM: 139 meq/L (ref 135–145)

## 2017-12-19 LAB — URINALYSIS, ROUTINE W REFLEX MICROSCOPIC
Bilirubin Urine: NEGATIVE
HGB URINE DIPSTICK: NEGATIVE
Ketones, ur: NEGATIVE
Leukocytes, UA: NEGATIVE
Nitrite: NEGATIVE
RBC / HPF: NONE SEEN (ref 0–?)
SPECIFIC GRAVITY, URINE: 1.025 (ref 1.000–1.030)
Total Protein, Urine: 30 — AB
URINE GLUCOSE: NEGATIVE
UROBILINOGEN UA: 0.2 (ref 0.0–1.0)
pH: 7 (ref 5.0–8.0)

## 2017-12-19 LAB — TSH: TSH: 1 u[IU]/mL (ref 0.35–4.50)

## 2017-12-19 LAB — PSA: PSA: 0.98 ng/mL (ref 0.10–4.00)

## 2017-12-19 NOTE — Patient Instructions (Signed)
Please quit smoking  Please continue all other medications as before, and refills have been done if requested.  Please have the pharmacy call with any other refills you may need.  Please continue your efforts at being more active, low cholesterol diet, and weight control  Please keep your appointments with your specialists as you may have planned  Please go to the XRAY Department in the Basement (go straight as you get off the elevator) for the x-ray testing  Please go to the LAB in the Basement (turn left off the elevator) for the tests to be done today  You will be contacted by phone if any changes need to be made immediately.  Otherwise, you will receive a letter about your results with an explanation, but please check with MyChart first.  Please remember to sign up for MyChart if you have not done so, as this will be important to you in the future with finding out test results, communicating by private email, and scheduling acute appointments online when needed.  Please return in 6 months, or sooner if needed

## 2017-12-19 NOTE — Assessment & Plan Note (Signed)
Overall stable, for cxr with ? Worsening cough and recent wt loss,  to f/u any worsening symptoms or concerns

## 2017-12-19 NOTE — Assessment & Plan Note (Signed)
stable overall by history and exam, recent data reviewed with pt, and pt to continue medical treatment as before,  to f/u any worsening symptoms or concerns, for f/u lab today 

## 2017-12-19 NOTE — Assessment & Plan Note (Signed)
To cont f/u with pain management

## 2017-12-19 NOTE — Assessment & Plan Note (Signed)
Urged to finally quit, declines chantix

## 2017-12-19 NOTE — Progress Notes (Signed)
Subjective:    Patient ID: Cody Grant, male    DOB: [birth year], 57 y.o.   MRN: [id number]  HPI  Here to f/u; overall doing ok,  Pt denies chest pain, increasing sob or doe, wheezing, orthopnea, PND, increased LE swelling, palpitations, dizziness or syncope.  Pt denies new neurological symptoms such as new headache, or facial or extremity weakness or numbness.  Pt denies polydipsia, polyuria, or low sugar episode.  Pt states overall good compliance with meds, mostly trying to follow appropriate diet, with wt overall stable,  but no regular exercise however.  Has been disabled since 2011 with low back pain. Now smoking now down to 3-4 cigs per day; still with chronic cough ? Worse recently, asks for cxr. Has lost wt with better diet.   Forgot to have labs done last visit Wt Readings from Last 3 Encounters:  12/19/17 154 lb (69.9 kg)  09/06/17 163 lb (73.9 kg)  06/21/17 169 lb (76.7 kg)   Past Medical History:  Diagnosis Date  . Allergic rhinitis, cause unspecified   . Asthma    as child  . Chronic lumbar radiculopathy    left with left drop foot/numbness  . Chronic pain    to see Dr Delynn Flavin  . 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 Factor 5 Leiden mutation, heterozygous ([place])   . Left foot drop   . Lumbar disc disease   . Other and unspecified hyperlipidemia 05/20/2013   Past Surgical History:  Procedure Laterality Date  . SPINE SURGERY  12/2010   micro diskectomy    reports that he has been smoking cigarettes. He has a 30.00 pack-year smoking history. He has never used smokeless tobacco. He reports that he drinks about 2.0 - 4.0 standard drinks of alcohol per week. He reports that he does not use drugs. family history includes COPD in his mother; Diabetes in his mother; Pulmonary embolism in his father. No Known Allergies Current Outpatient Medications on File Prior to Visit  Medication Sig Dispense Refill  . albuterol (ACCUNEB) 0.63 MG/3ML nebulizer solution Take 3 mLs (0.63 mg  total) by nebulization every 6 (six) hours as needed for wheezing. 75 mL 12  . albuterol (PROAIR HFA) 108 (90 Base) MCG/ACT inhaler INHALE 2 PUFFS INTO THE LUNGS EVERY 6 HOURS AS NEEDED FOR WHEEZING OR SHORTNESS OF BREATH. 8.5 g 11  . ibuprofen (ADVIL,MOTRIN) 400 MG tablet Take 1 tablet (400 mg total) by mouth every 8 (eight) hours as needed for pain. 90 tablet 1  . NAPROXEN PO Take 1 tablet by mouth as directed.    Marland Kitchen oxyCODONE (OXY IR/ROXICODONE) 5 MG immediate release tablet Take 1 tablet (5 mg total) by mouth every 8 (eight) hours as needed. May take an extra tablet for severe pain occasionally 100 tablet 0  . predniSONE (DELTASONE) 10 MG tablet Take  4 each am x 2 days,   2 each am x 2 days,  1 each am x 2 days and stop 14 tablet 0  . sildenafil (VIAGRA) 100 MG tablet Take 0.5-1 tablets (50-100 mg total) by mouth daily as needed for erectile dysfunction. 5 tablet 11  . Tiotropium Bromide-Olodaterol (STIOLTO RESPIMAT) 2.5-2.5 MCG/ACT AERS Inhale 2 puffs into the lungs daily. 1 Inhaler 11  . tiZANidine (ZANAFLEX) 4 MG tablet TAKE 1 TABLET(4 MG) BY MOUTH EVERY 8 HOURS AS NEEDED FOR MUSCLE SPASMS 90 tablet 0   No current facility-administered medications on file prior to visit.    Review of Systems  Constitutional: Negative  for other unusual diaphoresis or sweats HENT: Negative for ear discharge or swelling Eyes: Negative for other worsening visual disturbances Respiratory: Negative for stridor or other swelling  Gastrointestinal: Negative for worsening distension or other blood Genitourinary: Negative for retention or other urinary change Musculoskeletal: Negative for other MSK pain or swelling Skin: Negative for color change or other new lesions Neurological: Negative for worsening tremors and other numbness  Psychiatric/Behavioral: Negative for worsening agitation or other fatigue All other system neg per pt    Objective:   Physical Exam BP 118/76   Pulse 92   Temp 98.4 F (36.9 C)  (Oral)   Ht 5' 7.5" (1.715 m)   Wt 154 lb (69.9 kg)   SpO2 96%   BMI 23.76 kg/m  VS noted,  Constitutional: Pt appears in NAD HENT: Head: NCAT.  Right Ear: External ear normal.  Left Ear: External ear normal.  Eyes: . Pupils are equal, round, and reactive to light. Conjunctivae and EOM are normal Nose: without d/c or deformity Neck: Neck supple. Gross normal ROM Cardiovascular: Normal rate and regular rhythm.   Pulmonary/Chest: Effort normal and breath sounds decreased without rales or wheezing.  Abd:  Soft, NT, ND, + BS, no organomegaly Neurological: Pt is alert. At baseline orientation, motor grossly intact Skin: Skin is warm. No rashes, other new lesions, no LE edema Psychiatric: Pt behavior is normal without agitation  No other exam findings  Lab Results  Component Value Date   WBC 8.9 06/20/2016   HGB 15.0 06/20/2016   HCT 44.4 06/20/2016   PLT 241.0 06/20/2016   GLUCOSE 87 06/20/2016   CHOL 198 06/20/2016   TRIG 236.0 (H) 06/20/2016   HDL 41.80 06/20/2016   LDLDIRECT 126.0 06/20/2016   ALT 20 06/20/2016   AST 18 06/20/2016   NA 138 06/20/2016   K 4.6 06/20/2016   CL 106 06/20/2016   CREATININE 0.73 06/20/2016   BUN 11 06/20/2016   CO2 28 06/20/2016   TSH 1.00 06/20/2016   PSA 1.03 06/20/2016        Assessment & Plan:

## 2017-12-24 DIAGNOSIS — Z79891 Long term (current) use of opiate analgesic: Secondary | ICD-10-CM | POA: Diagnosis not present

## 2017-12-24 DIAGNOSIS — G894 Chronic pain syndrome: Secondary | ICD-10-CM | POA: Diagnosis not present

## 2017-12-24 DIAGNOSIS — M545 Low back pain: Secondary | ICD-10-CM | POA: Diagnosis not present

## 2018-01-21 DIAGNOSIS — M545 Low back pain: Secondary | ICD-10-CM | POA: Diagnosis not present

## 2018-01-21 DIAGNOSIS — G894 Chronic pain syndrome: Secondary | ICD-10-CM | POA: Diagnosis not present

## 2018-01-21 DIAGNOSIS — Z79891 Long term (current) use of opiate analgesic: Secondary | ICD-10-CM | POA: Diagnosis not present

## 2018-03-11 ENCOUNTER — Encounter: Payer: Self-pay | Admitting: Internal Medicine

## 2018-03-11 ENCOUNTER — Ambulatory Visit: Payer: Medicare HMO | Admitting: Internal Medicine

## 2018-03-11 VITALS — BP 138/80 | HR 68 | Ht 67.5 in | Wt 161.6 lb

## 2018-03-11 DIAGNOSIS — F17210 Nicotine dependence, cigarettes, uncomplicated: Secondary | ICD-10-CM

## 2018-03-11 DIAGNOSIS — J449 Chronic obstructive pulmonary disease, unspecified: Secondary | ICD-10-CM | POA: Diagnosis not present

## 2018-03-11 MED ORDER — PREDNISONE 10 MG PO TABS: ORAL_TABLET | ORAL | 11 refills | Status: DC

## 2018-03-11 MED ORDER — TIOTROPIUM BROMIDE-OLODATEROL 2.5-2.5 MCG/ACT IN AERS: 2.0000 | INHALATION_SPRAY | Freq: Every day | RESPIRATORY_TRACT | 11 refills | Status: DC

## 2018-03-11 MED ORDER — BUDESONIDE-FORMOTEROL FUMARATE 160-4.5 MCG/ACT IN AERO: 2.0000 | INHALATION_SPRAY | Freq: Two times a day (BID) | RESPIRATORY_TRACT | 6 refills | Status: DC

## 2018-03-11 MED ORDER — TIOTROPIUM BROMIDE MONOHYDRATE 18 MCG IN CAPS: 18.0000 ug | ORAL_CAPSULE | Freq: Every day | RESPIRATORY_TRACT | 0 refills | Status: DC

## 2018-03-11 NOTE — Patient Instructions (Addendum)
Plan A = Automatic = Stiolto 2 pffs each am  (symbicort 160 x 2 every 12 hours plus Spiriva 1.25 2 puffs every 12 hours until you use it up)    Plan B = Backup Only use your albuterol (proair) as a rescue medication to be used if you can't catch your breath by resting or doing a relaxed purse lip breathing pattern.  - The less you use it, the better it will work when you need it. - Ok to use the inhaler up to 2 puffs  every 4 hours if you must but call for appointment if use goes up over your usual need - Don't leave home without it !!  (think of it like the spare tire for your car)   Plan C = Crisis - only use your albuterol nebulizer if you first try Plan B and it fails to help > ok to use the nebulizer up to every 4 hours but if start needing it regularly call for immediate appointment   Plan D = Deltasone  When you start noticing more symptoms or need for albuterol :  Prednisone 10 mg take  4 each am x 2 days,   2 each am x 2 days,  1 each am x 2 days and stop   For cough >  Take  mucinex dm up to 600 mg x 1200 12 hours and supplement if needed with oxyir  up to 2 every 4 hours to suppress the urge to cough. Swallowing water and/or using ice chips/non mint and menthol containing candies (such as lifesavers or sugarless jolly ranchers) are also effective.  You should rest your voice and avoid activities that you know make you cough.  Once you have eliminated the cough for 3 straight days try reducing the oxyir   then the delsym as tolerated.     The key is to stop smoking completely before smoking completely stops you!      Although I certainly  don't endorse regular use of e cigs   I emphasized they should be considered a quick "one-way bridge" off all tobacco products and that he only use the commercial brands in the very short term and stop immediately if any resp symptoms worsen   Please schedule a follow up office visit in 6 weeks, call sooner if needed

## 2018-03-11 NOTE — Progress Notes (Addendum)
Subjective:     Patient ID: Cody Grant, male   DOB: 10-24-1960,    MRN: [id number]    Brief patient profile:  85 yowm active smoker with GOLD II copd with large reversible component 2015 referred to pulmonary clinic 01/25/2017 by Dr   Jenny Reichmann    History of Present Illness  01/25/2017 1st [facility] office visit/ Cody Grant   Chief Complaint  Patient presents with  . Pulmonary Consult    Referred by Dr. Cathlean Cower. Pt dxed with COPD in 2016. He states he has trouble with his breathing with hot/humid weather. He tends to have trouble in the am's, and has a non prod cough. He states he went a period of time without his spiriva due to cost and breathing got worse, but has improved since he started this back recently. He uses proair and also albuterol neb both 1 x per wk on average.   limited more by L radicular back  pain than   sob maint on spiriva dpi which he assoc with upper airway pattern coughing  Recurrent acute cough flares 2 x year then needs neb more then but usually not needing  Much saba unless he overdoes it out in the heat   rec Plan A = Automatic = Stiolto 2 pffs each am  Work on inhaler technique:  relax and gently blow all the way out then take a nice smooth deep breath back in, triggering the inhaler at same time you start breathing in.  Hold for up to 5 seconds if you can. Blow out thru nose. Rinse and gargle with water when done Plan B = Backup Only use your albuterol as a rescue medication Plan C = Crisis - only use your albuterol nebulizer if you first try Plan B and it fails to help > ok to use the nebulizer up to every 4 hours but if start needing it regularly call for immediate appointment     03/09/2017  f/u ov/Cody Grant re:  Copd GOLD III // still smoking 7 cigs per day / not using stiolto consistently  Chief Complaint  Patient presents with  . Follow-up    PFT's done today.  He states that he has good days and bad days. He is using his proair 4 x per wk on average.     lot of cough/gagging worse at hs but usually sleeps/wakes ok  then after coffee starts back up  [facility] = can't walk a nl pace on a flat grade s sob but does fine slow and flat eg walking flat/slow  Only started stiolto a few days prior to OV  Since wanted to use up his spiriva supply first  rec Plan A = Automatic = Stiolto 2 pffs each am  Plan B = Backup Only use your albuterol as a rescue medication The key is to stop smoking completely before smoking completely stops you!      09/06/2017  f/u ov/Cody Grant re:  GOLD III/ still smoking  Chief Complaint  Patient presents with  . Follow-up    Breathing had been worse here recently- relates to the pollen. He states that this happens every Spring. He rarely uses his albuterol inhaler but he has been using neb about 2 x per day.   Dyspnea:  Still = MMRC2 = can't walk a nl pace on a flat grade s sob but does fine slow and flat  Cough: mostly dry  Sleep: sleep ok once gets to  SABA use:  Was using a  lot with"pollen" including nebs, not now  Wake ups to coffee and cig daily then starts coughing mostly dry  Also cough immediately  at hs  But not waking him up noct or in am  rec Plan A = Automatic = Stiolto 2 pffs each am  Plan B = Backup Only use your albuterol (proair) as a rescue medication Plan C = Crisis - only use your albuterol nebulizer if you first try Plan B and it fails to help > ok to use the nebulizer up to every 4 hours but if start needing it regularly call for immediate appointment Plan D = Doctor When you start noticing more symptoms or need for albuterol :  Prednisone 10 mg take  4 each am x 2 days,   2 each am x 2 days,  1 each am x 2 days and stop   03/11/2018  f/u ov/Cody Grant re:  COPD III/ still smoking  Chief Complaint  Patient presents with  . Follow-up    Cough since "change in the weather"- non prod and tends to be worse at night. He rarely uses his albuterol inhaler or neb.   Dyspnea:  MMRC2 = can't walk a nl pace on a  flat grade s sob but does fine slow and flat  Cough: hs and then in am almost goes into fit gagging x 20 min  Then better p coughing up thick white mucus   Sleeping: flat/ on side / one pillow  SABA use: minimum 02: no    No obvious day to day or daytime variability or assoc  purulent sputum  or hemoptysis or cp or chest tightness, subjective wheeze or overt sinus or hb symptoms.     Also denies any obvious fluctuation of symptoms with weather or environmental changes or other aggravating or alleviating factors except as outlined above   No unusual exposure hx or h/o childhood pna/ asthma or knowledge of premature birth.  Current Allergies, Complete Past Medical History, Past Surgical History, Family History, and Social History were reviewed in [employer] record.  ROS  The following are not active complaints unless bolded Hoarseness, sore throat, dysphagia, dental problems, itching, sneezing,  nasal congestion or discharge of excess mucus or purulent secretions, ear ache,   fever, chills, sweats, unintended wt loss or wt gain, classically pleuritic or exertional cp,  orthopnea pnd or arm/hand swelling  or leg swelling, presyncope, palpitations, abdominal pain, anorexia, nausea, vomiting, diarrhea  or change in bowel habits or change in bladder habits, change in stools or change in urine, dysuria, hematuria,  rash, arthralgias, visual complaints, headache, numbness, weakness or ataxia or problems with walking or coordination,  change in mood or  memory.        Current Meds  Medication Sig  . albuterol (ACCUNEB) 0.63 MG/3ML nebulizer solution Take 3 mLs (0.63 mg total) by nebulization every 6 (six) hours as needed for wheezing.  Marland Kitchen albuterol (PROAIR HFA) 108 (90 Base) MCG/ACT inhaler INHALE 2 PUFFS INTO THE LUNGS EVERY 6 HOURS AS NEEDED FOR WHEEZING OR SHORTNESS OF BREATH.  Marland Kitchen ibuprofen (ADVIL,MOTRIN) 400 MG tablet Take 1 tablet (400 mg total) by mouth every 8 (eight) hours  as needed for pain.  Marland Kitchen NAPROXEN PO Take 1 tablet by mouth as directed.  Marland Kitchen oxyCODONE (OXY IR/ROXICODONE) 5 MG immediate release tablet Take 1 tablet (5 mg total) by mouth every 8 (eight) hours as needed. May take an extra tablet for severe pain occasionally  . sildenafil (VIAGRA)  100 MG tablet Take 0.5-1 tablets (50-100 mg total) by mouth daily as needed for erectile dysfunction.  . Tiotropium Bromide-Olodaterol (STIOLTO RESPIMAT) 2.5-2.5 MCG/ACT AERS Inhale 2 puffs into the lungs daily.  Marland Kitchen tiZANidine (ZANAFLEX) 4 MG tablet TAKE 1 TABLET(4 MG) BY MOUTH EVERY 8 HOURS AS NEEDED FOR MUSCLE SPASMS  .                      Objective:   Physical Exam   amb seems easily confused with details of care     03/11/2018      161  09/06/2017          163   01/25/17 167 lb 6.4 oz (75.9 kg)  12/19/16 165 lb (74.8 kg)  08/02/16 167 lb 2 oz (75.8 kg)     Vital signs reviewed - Note on arrival 02 sats  96% on RA      HEENT: nl dentition, turbinates bilaterally, and oropharynx.  external ear canals  Wax  Mod wax       NECK :  without JVD/Nodes/TM/ nl carotid upstrokes bilaterally   LUNGS: no acc muscle use,  Mild barrel  contour chest wall with bilateral  Distant bs s audible wheeze and  without cough on insp or exp maneuver and mild  Hyperresonant  to  percussion bilaterally     CV:  RRR  no s3 or murmur or increase in P2, and no edema   ABD:  soft and nontender with pos mid insp Hoover's  in the supine position. No bruits or organomegaly appreciated, bowel sounds nl  MS:   Nl gait/  ext warm without deformities, calf tenderness, cyanosis or clubbing No obvious joint restrictions   SKIN: warm and dry without lesions    NEURO:  alert, approp, nl sensorium with  no motor or cerebellar deficits apparent.         I personally reviewed images and agree with radiology impression as follows:  CXR:   12/19/17 COPD. No pneumonia, CHF, nor other acute cardiopulmonary abnormality.          Assessment:

## 2018-03-12 ENCOUNTER — Encounter: Payer: Self-pay | Admitting: Internal Medicine

## 2018-03-12 NOTE — Assessment & Plan Note (Signed)
4-5 min discussion re active cigarette smoking in addition to office E&M  Ask about tobacco use:   ongoing Advise quitting   > 3 min Discussed the risks and costs (both direct and indirect)  of smoking relative to the benefits of quitting but patient unwilling to commit at this point to a specific quit date.   Although I certainly  don't endorse regular use of e cigs   I emphasized they should be considered a quick "one-way bridge" off all tobacco products and that he only use the commercial brands in the very short term and stop immediately if any resp symptoms worsen  Assess willingness:  Not committed at this point Assist in quit attempt:  Per PCP when ready Arrange follow up:   Follow up per Primary Care planned

## 2018-03-12 NOTE — Assessment & Plan Note (Signed)
PFT's   05/20/2013   FEV1 1.95 (54 % ) ratio 59  p 39 % improvement from saba p ? prior to study with DLCO  62 % corrects to 85  % for alv volume   - 01/25/2017    try stiolto 2 each am  - PFT's  03/09/2017  FEV1 1.65 (48 % ) ratio 54  p 1 % improvement from saba p stiolto  prior to study with DLCO  56/57 % corrects to 74 % for alv volume      - 03/11/2018 added prn prednisone x 6 days for refractory symptoms - 03/11/2018  After extensive coaching inhaler device,  effectiveness =    90% with smi and hfa    DDX of  difficult airways management almost all start with A and  include Adherence, Ace Inhibitors, Acid Reflux, Active Sinus Disease, Alpha 1 Antitripsin deficiency, Anxiety masquerading as Airways dz,  ABPA,  Allergy(esp in young), Aspiration (esp in elderly), Adverse effects of meds,  Active smoking or vaping, A bunch of PE's (a small clot burden can't cause this syndrome unless there is already severe underlying pulm or vascular dz with poor reserve) plus two Bs  = Bronchiectasis and Beta blocker use..and one C= CHF  Adherence is always the initial "prime suspect" and is a multilayered concern that requires a "trust but verify" approach in every patient - starting with knowing how to use medications, especially inhalers, correctly, keeping up with refills and understanding the fundamental difference between maintenance and prns vs those medications only taken for a very short course and then stopped and not refilled.  - see hfa teaching  - very shaky on details of care and issues of drug formularies. Formulary restrictions will be an ongoing challenge for the forseable future and I would be happy to pick an alternative if the pt will first  provide me a list of them but pt  will need to return here for training for any new device that is required eg dpi vs hfa vs respimat. In meantime we can always provide samples so the patient never runs out of any needed respiratory medications.  - return with  all meds in hand using a trust but verify approach to confirm accurate Medication  Reconciliation The principal here is that until we are certain that the  patients are doing what we've asked, it makes no sense to ask them to do more.    Active smoking (see separate a/p)   ? Allergy/ asthma > added pred x 6 d prn flare and if freq flares need to shift to spiriva/symbicort 160 (or if stiolto no longer covered)     I had an extended discussion with the patient reviewing all relevant studies completed to date and  lasting 15 to 20 minutes of a 25 minute visit    See device teaching which extended face to face time for this visit.  Each maintenance medication was reviewed in detail including emphasizing most importantly the difference between maintenance and prns and under what circumstances the prns are to be triggered using an action plan format that is not reflected in the computer generated alphabetically organized AVS which I have not found useful in most complex patients, especially with respiratory illnesses  Please see AVS for specific instructions unique to this visit that I personally wrote and verbalized to the the pt in detail and then reviewed with pt  by my nurse highlighting any  changes in therapy recommended at today's visit  to their plan of care.

## 2018-03-20 DIAGNOSIS — G894 Chronic pain syndrome: Secondary | ICD-10-CM | POA: Diagnosis not present

## 2018-03-20 DIAGNOSIS — Z79891 Long term (current) use of opiate analgesic: Secondary | ICD-10-CM | POA: Diagnosis not present

## 2018-03-20 DIAGNOSIS — E785 Hyperlipidemia, unspecified: Secondary | ICD-10-CM | POA: Diagnosis not present

## 2018-03-20 DIAGNOSIS — M545 Low back pain: Secondary | ICD-10-CM | POA: Diagnosis not present

## 2018-04-10 ENCOUNTER — Encounter: Payer: Self-pay | Admitting: Internal Medicine

## 2018-04-10 ENCOUNTER — Ambulatory Visit: Payer: Medicare HMO | Admitting: Internal Medicine

## 2018-04-10 VITALS — BP 130/82 | HR 88 | Ht 68.0 in | Wt 162.0 lb

## 2018-04-10 DIAGNOSIS — F17210 Nicotine dependence, cigarettes, uncomplicated: Secondary | ICD-10-CM | POA: Diagnosis not present

## 2018-04-10 DIAGNOSIS — J449 Chronic obstructive pulmonary disease, unspecified: Secondary | ICD-10-CM | POA: Diagnosis not present

## 2018-04-10 NOTE — Progress Notes (Signed)
Subjective:     Patient ID: Cody Grant, male   DOB: 1961-02-01,    MRN: [id number]    Brief patient profile:  53 yowm active smoker with GOLD II copd with large reversible component 2015 referred to pulmonary clinic 01/25/2017 by Dr   Cody Grant    History of Present Illness  01/25/2017 1st [place] [facility] office visit/ Cody Grant   Chief Complaint  Patient presents with  . Pulmonary Consult    Referred by Dr. Cathlean Grant. Pt dxed with COPD in 2016. He states he has trouble with his breathing with hot/humid weather. He tends to have trouble in the am's, and has a non prod cough. He states he went a period of time without his spiriva due to cost and breathing got worse, but has improved since he started this back recently. He uses proair and also albuterol neb both 1 x per wk on average.   limited more by L radicular back  pain than   sob maint on spiriva dpi which he assoc with upper airway pattern coughing  Recurrent acute cough flares 2 x year then needs neb more then but usually not needing  Much saba unless he overdoes it out in the heat   rec Plan A = Automatic = Stiolto 2 pffs each am  Work on inhaler technique:  relax and gently blow all the way out then take a nice smooth deep breath back in, triggering the inhaler at same time you start breathing in.  Hold for up to 5 seconds if you can. Blow out thru nose. Rinse and gargle with water when done Plan B = Backup Only use your albuterol as a rescue medication Plan C = Crisis - only use your albuterol nebulizer if you first try Plan B and it fails to help > ok to use the nebulizer up to every 4 hours but if start needing it regularly call for immediate appointment     03/09/2017  f/u ov/Cody Grant re:  Copd GOLD III // still smoking 7 cigs per day / not using stiolto consistently  Chief Complaint  Patient presents with  . Follow-up    PFT's done today.  He states that he has good days and bad days. He is using his proair 4 x per wk on average.     lot of cough/gagging worse at hs but usually sleeps/wakes ok  then after coffee starts back up  [facility] = can't walk a nl pace on a flat grade s sob but does fine slow and flat eg walking flat/slow  Only started stiolto a few days prior to OV  Since wanted to use up his spiriva supply first  rec Plan A = Automatic = Stiolto 2 pffs each am  Plan B = Backup Only use your albuterol as a rescue medication The key is to stop smoking completely before smoking completely stops you!      09/06/2017  f/u ov/Cody Grant re:  GOLD III/ still smoking  Chief Complaint  Patient presents with  . Follow-up    Breathing had been worse here recently- relates to the pollen. He states that this happens every Spring. He rarely uses his albuterol inhaler but he has been using neb about 2 x per day.   Dyspnea:  Still = MMRC2 = can't walk a nl pace on a flat grade s sob but does fine slow and flat  Cough: mostly dry  Sleep: sleep ok once gets to  SABA use:  Was using a  lot with"pollen" including nebs, not now  Wake ups to coffee and cig daily then starts coughing mostly dry  Also cough immediately  at hs  But not waking him up noct or in am  rec Plan A = Automatic = Stiolto 2 pffs each am  Plan B = Backup Only use your albuterol (proair) as a rescue medication Plan C = Crisis - only use your albuterol nebulizer if you first try Plan B and it fails to help > ok to use the nebulizer up to every 4 hours but if start needing it regularly call for immediate appointment Plan D = Doctor When you start noticing more symptoms or need for albuterol :  Prednisone 10 mg take  4 each am x 2 days,   2 each am x 2 days,  1 each am x 2 days and stop    03/11/2018  f/u ov/Cody Grant re:  COPD III/ still smoking  Chief Complaint  Patient presents with  . Follow-up    Cough since "change in the weather"- non prod and tends to be worse at night. He rarely uses his albuterol inhaler or neb.   Dyspnea:  MMRC2 = can't walk a nl pace on a  flat grade s sob but does fine slow and flat  Cough: hs and then in am almost goes into fit gagging x 20 min  Then better p coughing up thick white mucus   Sleeping: flat/ on side / one pillow  SABA use: minimum 02: no  rec  Plan A = Automatic = Stiolto 2 pffs each am  (symbicort 160 x 2 every 12 hours plus Spiriva 1.25 2 puffs every 12 hours until you use it up)   Plan B = Backup Only use your albuterol (proair)  Plan C = Crisis - only use your albuterol nebulizer if you first try Plan B Plan D = Deltasone  When you start noticing more symptoms or need for albuterol :  Prednisone 10 mg take  4 each am x 2 days,   2 each am x 2 days,  1 each am x 2 days and stop Take  mucinex dm up to 600 mg x 1200 12 hours and supplement if needed with oxyir  up to 2 every 4 hours  The key is to stop smoking completely before smoking completely stops you!  Although I certainly  don't endorse regular use of e cigs   I emphasized they should be considered a quick "one-way bridge" off all tobacco products and that he only use the commercial brands in the very short term and stop immediately if any resp symptoms worsen        04/10/2018  f/u ov/Cody Grant re:  COPD GOLD III / still smoking Chief Complaint  Patient presents with  . Follow-up    Cough has improved some. He is cutting back on his smoking and plans to quit. He rarely uses his proair or neb.   Dyspnea:  MMRC2 = can't walk a nl pace on a flat grade s sob but does fine slow and flat e  Cough: some each am clear minimum mucus x 10 min worse in cold weather  Sleeping: on side on pillow  SABA use: min/ no pred either  02: no    No obvious day to day or daytime variability or assoc excess/ purulent sputum or mucus plugs or hemoptysis or cp or chest tightness, subjective wheeze or overt sinus or hb symptoms.   Sleeping as  above  without nocturnal  or early am exacerbation  of respiratory  c/o's or need for noct saba. Also denies any obvious fluctuation  of symptoms with weather or environmental changes or other aggravating or alleviating factors except as outlined above   No unusual exposure hx or h/o childhood pna/ asthma or knowledge of premature birth.  Current Allergies, Complete Past Medical History, Past Surgical History, Family History, and Social History were reviewed in [employer] record.  ROS  The following are not active complaints unless bolded Hoarseness, sore throat, dysphagia, dental problems, itching, sneezing,  nasal congestion or discharge of excess mucus or purulent secretions, ear ache,   fever, chills, sweats, unintended wt loss or wt gain, classically pleuritic or exertional cp,  orthopnea pnd or arm/hand swelling  or leg swelling, presyncope, palpitations, abdominal pain, anorexia, nausea, vomiting, diarrhea  or change in bowel habits or change in bladder habits, change in stools or change in urine, dysuria, hematuria,  rash, arthralgias, visual complaints, headache, numbness, weakness or ataxia or problems with walking or coordination,  change in mood or  memory.        Current Meds  Medication Sig  . albuterol (ACCUNEB) 0.63 MG/3ML nebulizer solution Take 3 mLs (0.63 mg total) by nebulization every 6 (six) hours as needed for wheezing.  Marland Kitchen albuterol (PROAIR HFA) 108 (90 Base) MCG/ACT inhaler INHALE 2 PUFFS INTO THE LUNGS EVERY 6 HOURS AS NEEDED FOR WHEEZING OR SHORTNESS OF BREATH.  Marland Kitchen ibuprofen (ADVIL,MOTRIN) 400 MG tablet Take 1 tablet (400 mg total) by mouth every 8 (eight) hours as needed for pain.  Marland Kitchen NAPROXEN PO Take 1 tablet by mouth as directed.  Marland Kitchen oxyCODONE (OXY IR/ROXICODONE) 5 MG immediate release tablet Take 1 tablet (5 mg total) by mouth every 8 (eight) hours as needed. May take an extra tablet for severe pain occasionally  . sildenafil (VIAGRA) 100 MG tablet Take 0.5-1 tablets (50-100 mg total) by mouth daily as needed for erectile dysfunction.  . Tiotropium Bromide-Olodaterol (STIOLTO  RESPIMAT) 2.5-2.5 MCG/ACT AERS Inhale 2 puffs into the lungs daily.  Marland Kitchen tiZANidine (ZANAFLEX) 4 MG tablet TAKE 1 TABLET(4 MG) BY MOUTH EVERY 8 HOURS AS NEEDED FOR MUSCLE SPASMS                      Objective:   Physical Exam    amb wm nad    04/10/2018       162  03/11/2018      161  09/06/2017          163   01/25/17 167 lb 6.4 oz (75.9 kg)  12/19/16 165 lb (74.8 kg)  08/02/16 167 lb 2 oz (75.8 kg)      Vital signs reviewed - Note on arrival 02 sats  95% on RA      HEENT: nl dentition / oropharynx. Nl external ear canals without cough reflex -  Mild bilateral non-specific turbinate edema  / mod wax both ext canals   NECK :  without JVD/Nodes/TM/ nl carotid upstrokes bilaterally   LUNGS: no acc muscle use,  Mild barrel  contour chest wall with bilateral  Distant bs s trace end exp  wheeze and  without cough on insp or exp maneuver and mild   Hyperresonant  to  percussion bilaterally     CV:  RRR  no s3 or murmur or increase in P2, and no edema   ABD:  soft and nontender with pos late  insp  Hoover's  in the supine position. No bruits or organomegaly appreciated, bowel sounds nl  MS:   Nl gait/  ext warm without deformities, calf tenderness, cyanosis or clubbing No obvious joint restrictions   SKIN: warm and dry without lesions    NEURO:  alert, approp, nl sensorium with  no motor or cerebellar deficits apparent.                      Assessment:

## 2018-04-10 NOTE — Assessment & Plan Note (Signed)
Getting there but not quite yet - e cigs make him cough > f/u pcp planned   Counseling to stop smoking did not reach time threshold for separate billing.    I had an extended discussion with the patient   reviewing all relevant studies completed to date and  lasting 15 to 20 minutes of a 25 minute visit  which included directly observing ambulatory 02 saturation study documented in a/p section of  today's  office note.  Each maintenance medication was reviewed in detail including most importantly the difference between maintenance and prns and under what circumstances the prns are to be triggered using an action plan format that is not reflected in the computer generated alphabetically organized AVS.     Please see AVS for specific instructions unique to this visit that I personally wrote and verbalized to the the pt in detail and then reviewed with pt  by my nurse highlighting any changes in therapy recommended at today's visit .

## 2018-04-10 NOTE — Patient Instructions (Signed)
No change in recommendations   Please schedule a follow up visit in 6  months but call sooner if needed    

## 2018-04-10 NOTE — Assessment & Plan Note (Addendum)
PFT's   05/20/2013   FEV1 1.95 (54 % ) ratio 59  p 39 % improvement from saba p ? prior to study with DLCO  62 % corrects to 85  % for alv volume   - 01/25/2017    try stiolto 2 each am  - PFT's  03/09/2017  FEV1 1.65 (48 % ) ratio 54  p 1 % improvement from saba p stiolto  prior to study with DLCO  56/57 % corrects to 74 % for alv volume   - 03/11/2018 added prn prednisone x 6 days for refractory symptoms - 03/11/2018  After extensive coaching inhaler device,  effectiveness =    90% with smi and hfa   - 04/10/2018   Walked RA  2 laps @ 284ft each @ avg pace  stopped due to end of study  No desats   Pt is Group B in terms of symptom/risk and laba/lama therefore appropriate rx at this point and doing well on stiolto which is covered under current plan   Reviewed: Formulary restrictions will be an ongoing challenge for the forseable future and I would be happy to pick an alternative if the pt will first  provide me a list of them but pt  will need to return here for training for any new device that is required eg dpi vs hfa vs respimat.    In meantime we can always provide samples so the patient never runs out of any needed respiratory medications

## 2018-05-29 DIAGNOSIS — G894 Chronic pain syndrome: Secondary | ICD-10-CM | POA: Diagnosis not present

## 2018-05-29 DIAGNOSIS — M545 Low back pain: Secondary | ICD-10-CM | POA: Diagnosis not present

## 2018-05-29 DIAGNOSIS — Z79891 Long term (current) use of opiate analgesic: Secondary | ICD-10-CM | POA: Diagnosis not present

## 2018-06-21 ENCOUNTER — Ambulatory Visit: Payer: Self-pay | Admitting: Internal Medicine

## 2018-06-25 ENCOUNTER — Ambulatory Visit (INDEPENDENT_AMBULATORY_CARE_PROVIDER_SITE_OTHER): Payer: Medicare HMO | Admitting: Internal Medicine

## 2018-06-25 ENCOUNTER — Encounter: Payer: Self-pay | Admitting: Internal Medicine

## 2018-06-25 VITALS — BP 136/88 | HR 106 | Temp 98.7°F | Ht 68.0 in | Wt 163.0 lb

## 2018-06-25 DIAGNOSIS — R21 Rash and other nonspecific skin eruption: Secondary | ICD-10-CM | POA: Insufficient documentation

## 2018-06-25 DIAGNOSIS — Z0000 Encounter for general adult medical examination without abnormal findings: Secondary | ICD-10-CM | POA: Diagnosis not present

## 2018-06-25 DIAGNOSIS — F17210 Nicotine dependence, cigarettes, uncomplicated: Secondary | ICD-10-CM

## 2018-06-25 NOTE — Patient Instructions (Signed)
Please quit smoking  You will be contacted regarding the referral for: Dermatology  Please continue all other medications as before, and refills have been done if requested.  Please have the pharmacy call with any other refills you may need.  Please continue your efforts at being more active, low cholesterol diet, and weight control.  You are otherwise up to date with prevention measures today.  Please keep your appointments with your specialists as you may have planned  Please return in 6 months, or sooner if needed

## 2018-06-25 NOTE — Assessment & Plan Note (Signed)
?   Psoriasis, for derm referral per pt request

## 2018-06-25 NOTE — Assessment & Plan Note (Signed)
urged to quit 

## 2018-06-25 NOTE — Progress Notes (Signed)
Subjective:    Patient ID: Cody Grant, male    DOB: [birth year], 58 y.o.   MRN: [id number]  HPI  Here for wellness and f/u;  Overall doing ok;  Pt denies Chest pain, worsening SOB, DOE, wheezing, orthopnea, PND, worsening LE edema, palpitations, dizziness or syncope.  Pt denies neurological change such as new headache, facial or extremity weakness.  Pt denies polydipsia, polyuria, or low sugar symptoms. Pt states overall good compliance with treatment and medications, good tolerability, and has been trying to follow appropriate diet.  Pt denies worsening depressive symptoms, suicidal ideation or panic. No fever, night sweats, wt loss, loss of appetite, or other constitutional symptoms.  Pt states good ability with ADL's, has low fall risk, home safety reviewed and adequate, no other significant changes in hearing or vision, and only occasionally active with exercise.  Trying to finally quit smoking, not quite ready yet, does not want chantix.  Has persistent rash itchy to bilat arm extensor areas, asks for derm referral Past Medical History:  Diagnosis Date  . Allergic rhinitis, cause unspecified   . Asthma    as child  . Chronic lumbar radiculopathy    left with left drop foot/numbness  . Chronic pain    to see Dr Delynn Flavin  . COPD, severe ([place]) 06/22/2013  . Factor 5 Leiden mutation, heterozygous ([place])   . Left foot drop   . Lumbar disc disease   . Other and unspecified hyperlipidemia 05/20/2013   Past Surgical History:  Procedure Laterality Date  . SPINE SURGERY  12/2010   micro diskectomy    reports that he has been smoking cigarettes. He has a 30.00 pack-year smoking history. He has never used smokeless tobacco. He reports current alcohol use of about 2.0 - 4.0 standard drinks of alcohol per week. He reports that he does not use drugs. family history includes COPD in his mother; Diabetes in his mother; Pulmonary embolism in his father. No Known Allergies Current Outpatient  Medications on File Prior to Visit  Medication Sig Dispense Refill  . albuterol (ACCUNEB) 0.63 MG/3ML nebulizer solution Take 3 mLs (0.63 mg total) by nebulization every 6 (six) hours as needed for wheezing. 75 mL 12  . albuterol (PROAIR HFA) 108 (90 Base) MCG/ACT inhaler INHALE 2 PUFFS INTO THE LUNGS EVERY 6 HOURS AS NEEDED FOR WHEEZING OR SHORTNESS OF BREATH. 8.5 g 11  . ibuprofen (ADVIL,MOTRIN) 400 MG tablet Take 1 tablet (400 mg total) by mouth every 8 (eight) hours as needed for pain. 90 tablet 1  . NAPROXEN PO Take 1 tablet by mouth as directed.    Marland Kitchen oxyCODONE (OXY IR/ROXICODONE) 5 MG immediate release tablet Take 1 tablet (5 mg total) by mouth every 8 (eight) hours as needed. May take an extra tablet for severe pain occasionally 100 tablet 0  . predniSONE (DELTASONE) 10 MG tablet Take  4 each am x 2 days,   2 each am x 2 days,  1 each am x 2 days and stop 14 tablet 11  . sildenafil (VIAGRA) 100 MG tablet Take 0.5-1 tablets (50-100 mg total) by mouth daily as needed for erectile dysfunction. 5 tablet 11  . Tiotropium Bromide-Olodaterol (STIOLTO RESPIMAT) 2.5-2.5 MCG/ACT AERS Inhale 2 puffs into the lungs daily. 1 Inhaler 11  . tiZANidine (ZANAFLEX) 4 MG tablet TAKE 1 TABLET(4 MG) BY MOUTH EVERY 8 HOURS AS NEEDED FOR MUSCLE SPASMS 90 tablet 0   No current facility-administered medications on file prior to visit.    Review  of Systems Constitutional: Negative for other unusual diaphoresis, sweats, appetite or weight changes HENT: Negative for other worsening hearing loss, ear pain, facial swelling, mouth sores or neck stiffness.   Eyes: Negative for other worsening pain, redness or other visual disturbance.  Respiratory: Negative for other stridor or swelling Cardiovascular: Negative for other palpitations or other chest pain  Gastrointestinal: Negative for worsening diarrhea or loose stools, blood in stool, distention or other pain Genitourinary: Negative for hematuria, flank pain or other  change in urine volume.  Musculoskeletal: Negative for myalgias or other joint swelling.  Skin: Negative for other color change, or other wound or worsening drainage.  Neurological: Negative for other syncope or numbness. Hematological: Negative for other adenopathy or swelling Psychiatric/Behavioral: Negative for hallucinations, other worsening agitation, SI, self-injury, or new decreased concentration All other system neg per pt    Objective:   Physical Exam BP 136/88   Pulse (!) 106   Temp 98.7 F (37.1 C) (Oral)   Ht 5\' 8"  (1.727 m)   Wt 163 lb (73.9 kg)   SpO2 93%   BMI 24.78 kg/m  VS noted,  Constitutional: Pt is oriented to person, place, and time. Appears well-developed and well-nourished, in no significant distress and comfortable Head: Normocephalic and atraumatic  Eyes: Conjunctivae and EOM are normal. Pupils are equal, round, and reactive to light Right Ear: External ear normal without discharge Left Ear: External ear normal without discharge Nose: Nose without discharge or deformity Mouth/Throat: Oropharynx is without other ulcerations and moist  Neck: Normal range of motion. Neck supple. No JVD present. No tracheal deviation present or significant neck LA or mass Cardiovascular: Normal rate, regular rhythm, normal heart sounds and intact distal pulses.   Pulmonary/Chest: WOB normal and breath sounds without rales or wheezing  Abdominal: Soft. Bowel sounds are normal. NT. No HSM  Musculoskeletal: Normal range of motion. Exhibits no edema Lymphadenopathy: Has no other cervical adenopathy.  Neurological: Pt is alert and oriented to person, place, and time. Pt has normal reflexes. No cranial nerve deficit. Motor grossly intact, Gait intact Skin: Skin is warm and dry. No rash noted or new ulcerations Psychiatric:  Has normal mood and affect. Behavior is normal without agitation, mid nervous Lab Results  Component Value Date   WBC 8.9 12/19/2017   HGB 14.9 12/19/2017    HCT 44.5 12/19/2017   PLT 275.0 12/19/2017   GLUCOSE 97 12/19/2017   CHOL 182 12/19/2017   TRIG 168.0 (H) 12/19/2017   HDL 43.90 12/19/2017   LDLDIRECT 126.0 06/20/2016   LDLCALC 105 (H) 12/19/2017   ALT 11 12/19/2017   AST 12 12/19/2017   NA 139 12/19/2017   K 4.8 12/19/2017   CL 103 12/19/2017   CREATININE 0.82 12/19/2017   BUN 12 12/19/2017   CO2 29 12/19/2017   TSH 1.00 12/19/2017   PSA 0.98 12/19/2017   Declines further labs    Assessment & Plan:

## 2018-06-25 NOTE — Assessment & Plan Note (Signed)

## 2018-07-26 DIAGNOSIS — G894 Chronic pain syndrome: Secondary | ICD-10-CM | POA: Diagnosis not present

## 2018-07-26 DIAGNOSIS — Z79891 Long term (current) use of opiate analgesic: Secondary | ICD-10-CM | POA: Diagnosis not present

## 2018-07-26 DIAGNOSIS — M545 Low back pain: Secondary | ICD-10-CM | POA: Diagnosis not present

## 2018-08-20 ENCOUNTER — Encounter: Payer: Self-pay | Admitting: Internal Medicine

## 2018-09-25 DIAGNOSIS — Z79891 Long term (current) use of opiate analgesic: Secondary | ICD-10-CM | POA: Diagnosis not present

## 2018-09-25 DIAGNOSIS — G894 Chronic pain syndrome: Secondary | ICD-10-CM | POA: Diagnosis not present

## 2018-09-25 DIAGNOSIS — M545 Low back pain: Secondary | ICD-10-CM | POA: Diagnosis not present

## 2018-10-10 ENCOUNTER — Ambulatory Visit: Payer: Self-pay | Admitting: Internal Medicine

## 2018-11-21 DIAGNOSIS — Z79891 Long term (current) use of opiate analgesic: Secondary | ICD-10-CM | POA: Diagnosis not present

## 2018-11-21 DIAGNOSIS — M545 Low back pain: Secondary | ICD-10-CM | POA: Diagnosis not present

## 2018-11-21 DIAGNOSIS — G894 Chronic pain syndrome: Secondary | ICD-10-CM | POA: Diagnosis not present

## 2018-12-24 ENCOUNTER — Ambulatory Visit: Payer: Self-pay | Admitting: Internal Medicine

## 2018-12-24 DIAGNOSIS — Z0289 Encounter for other administrative examinations: Secondary | ICD-10-CM

## 2019-01-22 DIAGNOSIS — M545 Low back pain: Secondary | ICD-10-CM | POA: Diagnosis not present

## 2019-01-22 DIAGNOSIS — G894 Chronic pain syndrome: Secondary | ICD-10-CM | POA: Diagnosis not present

## 2019-01-22 DIAGNOSIS — Z79891 Long term (current) use of opiate analgesic: Secondary | ICD-10-CM | POA: Diagnosis not present

## 2019-02-24 ENCOUNTER — Ambulatory Visit (INDEPENDENT_AMBULATORY_CARE_PROVIDER_SITE_OTHER): Payer: Medicare HMO | Admitting: Internal Medicine

## 2019-02-24 ENCOUNTER — Encounter: Payer: Self-pay | Admitting: Internal Medicine

## 2019-02-24 ENCOUNTER — Other Ambulatory Visit: Payer: Self-pay

## 2019-02-24 ENCOUNTER — Other Ambulatory Visit (INDEPENDENT_AMBULATORY_CARE_PROVIDER_SITE_OTHER): Payer: Medicare HMO

## 2019-02-24 VITALS — BP 164/104 | HR 89 | Temp 98.5°F | Ht 68.0 in | Wt 160.0 lb

## 2019-02-24 DIAGNOSIS — E785 Hyperlipidemia, unspecified: Secondary | ICD-10-CM

## 2019-02-24 DIAGNOSIS — J449 Chronic obstructive pulmonary disease, unspecified: Secondary | ICD-10-CM

## 2019-02-24 DIAGNOSIS — J45909 Unspecified asthma, uncomplicated: Secondary | ICD-10-CM | POA: Diagnosis not present

## 2019-02-24 DIAGNOSIS — R21 Rash and other nonspecific skin eruption: Secondary | ICD-10-CM | POA: Diagnosis not present

## 2019-02-24 DIAGNOSIS — N320 Bladder-neck obstruction: Secondary | ICD-10-CM

## 2019-02-24 LAB — CBC WITH DIFFERENTIAL/PLATELET
Basophils Absolute: 0.1 10*3/uL (ref 0.0–0.1)
Basophils Relative: 1 % (ref 0.0–3.0)
Eosinophils Absolute: 0.2 10*3/uL (ref 0.0–0.7)
Eosinophils Relative: 2.1 % (ref 0.0–5.0)
HCT: 42.7 % (ref 39.0–52.0)
Hemoglobin: 14.5 g/dL (ref 13.0–17.0)
Lymphocytes Relative: 15.6 % (ref 12.0–46.0)
Lymphs Abs: 1.4 10*3/uL (ref 0.7–4.0)
MCHC: 33.9 g/dL (ref 30.0–36.0)
MCV: 92.1 fl (ref 78.0–100.0)
Monocytes Absolute: 0.8 10*3/uL (ref 0.1–1.0)
Monocytes Relative: 9.5 % (ref 3.0–12.0)
Neutro Abs: 6.4 10*3/uL (ref 1.4–7.7)
Neutrophils Relative %: 71.8 % (ref 43.0–77.0)
Platelets: 248 10*3/uL (ref 150.0–400.0)
RBC: 4.64 Mil/uL (ref 4.22–5.81)
RDW: 13 % (ref 11.5–15.5)
WBC: 8.9 10*3/uL (ref 4.0–10.5)

## 2019-02-24 LAB — LDL CHOLESTEROL, DIRECT: Direct LDL: 137 mg/dL

## 2019-02-24 LAB — URINALYSIS, ROUTINE W REFLEX MICROSCOPIC
Bilirubin Urine: NEGATIVE
Hgb urine dipstick: NEGATIVE
Ketones, ur: NEGATIVE
Leukocytes,Ua: NEGATIVE
Nitrite: NEGATIVE
RBC / HPF: NONE SEEN (ref 0–?)
Specific Gravity, Urine: 1.01 (ref 1.000–1.030)
Total Protein, Urine: NEGATIVE
Urine Glucose: NEGATIVE
Urobilinogen, UA: 0.2 (ref 0.0–1.0)
WBC, UA: NONE SEEN (ref 0–?)
pH: 7 (ref 5.0–8.0)

## 2019-02-24 LAB — BASIC METABOLIC PANEL
BUN: 12 mg/dL (ref 6–23)
CO2: 29 mEq/L (ref 19–32)
Calcium: 9.2 mg/dL (ref 8.4–10.5)
Chloride: 103 mEq/L (ref 96–112)
Creatinine, Ser: 1.02 mg/dL (ref 0.40–1.50)
GFR: 74.93 mL/min (ref >60.00)
Glucose, Bld: 85 mg/dL (ref 70–99)
Potassium: 4.2 mEq/L (ref 3.5–5.1)
Sodium: 138 mEq/L (ref 135–145)

## 2019-02-24 LAB — HEPATIC FUNCTION PANEL
ALT: 15 U/L (ref 0–53)
AST: 18 U/L (ref 0–37)
Albumin: 4.5 g/dL (ref 3.5–5.2)
Alkaline Phosphatase: 55 U/L (ref 39–117)
Bilirubin, Direct: 0.1 mg/dL (ref 0.0–0.3)
Total Bilirubin: 0.6 mg/dL (ref 0.2–1.2)
Total Protein: 6.7 g/dL (ref 6.0–8.3)

## 2019-02-24 LAB — PSA: PSA: 1.08 ng/mL (ref 0.10–4.00)

## 2019-02-24 LAB — LIPID PANEL
Cholesterol: 213 mg/dL — ABNORMAL HIGH (ref 0–200)
HDL: 50.6 mg/dL (ref >39.00)
NonHDL: 162.73
Total CHOL/HDL Ratio: 4
Triglycerides: 205 mg/dL — ABNORMAL HIGH (ref 0.0–149.0)
VLDL: 41 mg/dL — ABNORMAL HIGH (ref 0.0–40.0)

## 2019-02-24 LAB — TSH: TSH: 1.41 u[IU]/mL (ref 0.35–4.50)

## 2019-02-24 MED ORDER — TRIAMCINOLONE ACETONIDE 0.1 % EX CREA: 1.0000 "application " | TOPICAL_CREAM | Freq: Two times a day (BID) | CUTANEOUS | 1 refills | Status: AC

## 2019-02-24 NOTE — Progress Notes (Signed)
Subjective:    Patient ID: Cody Grant, male    DOB: [birth year], 58 y.o.   MRN: [id number]  HPI  Here to f/u; overall doing ok,  Pt denies chest pain, increasing sob or doe, wheezing, orthopnea, PND, increased LE swelling, palpitations, dizziness or syncope.  Pt denies new neurological symptoms such as new headache, or facial or extremity weakness or numbness.  Pt denies polydipsia, polyuria, or low sugar episode.  Pt states overall good compliance with meds, mostly trying to follow appropriate diet, with wt overall stable,  but little exercise however. Breathing improvedin last few months, stil smoking but less and not quite ready to quit. Inhaler contins to help. More stress lately with mother dental problems. Also has a mild itchy red rash to left mid medial leg for several wks. Pt continues to have recurring LBP with left sciatica, but no bowel or bladder change, fever, wt loss,  worsening LE numbness/weakness, gait change or falls. Past Medical History:  Diagnosis Date  . Allergic rhinitis, cause unspecified   . Asthma    as child  . Chronic lumbar radiculopathy    left with left drop foot/numbness  . Chronic pain    to see Dr Delynn Flavin  . COPD, severe ([place]) 06/22/2013  . Factor 5 Leiden mutation, heterozygous ([place])   . Left foot drop   . Lumbar disc disease   . Other and unspecified hyperlipidemia 05/20/2013   Past Surgical History:  Procedure Laterality Date  . SPINE SURGERY  12/2010   micro diskectomy    reports that he has been smoking cigarettes. He has a 30.00 pack-year smoking history. He has never used smokeless tobacco. He reports current alcohol use of about 2.0 - 4.0 standard drinks of alcohol per week. He reports that he does not use drugs. family history includes COPD in his mother; Diabetes in his mother; Pulmonary embolism in his father. No Known Allergies Current Outpatient Medications on File Prior to Visit  Medication Sig Dispense Refill  . albuterol (ACCUNEB)  0.63 MG/3ML nebulizer solution Take 3 mLs (0.63 mg total) by nebulization every 6 (six) hours as needed for wheezing. 75 mL 12  . albuterol (PROAIR HFA) 108 (90 Base) MCG/ACT inhaler INHALE 2 PUFFS INTO THE LUNGS EVERY 6 HOURS AS NEEDED FOR WHEEZING OR SHORTNESS OF BREATH. 8.5 g 11  . ibuprofen (ADVIL,MOTRIN) 400 MG tablet Take 1 tablet (400 mg total) by mouth every 8 (eight) hours as needed for pain. 90 tablet 1  . NAPROXEN PO Take 1 tablet by mouth as directed.    Marland Kitchen oxyCODONE (OXY IR/ROXICODONE) 5 MG immediate release tablet Take 1 tablet (5 mg total) by mouth every 8 (eight) hours as needed. May take an extra tablet for severe pain occasionally 100 tablet 0  . predniSONE (DELTASONE) 10 MG tablet Take  4 each am x 2 days,   2 each am x 2 days,  1 each am x 2 days and stop 14 tablet 11  . sildenafil (VIAGRA) 100 MG tablet Take 0.5-1 tablets (50-100 mg total) by mouth daily as needed for erectile dysfunction. 5 tablet 11  . Tiotropium Bromide-Olodaterol (STIOLTO RESPIMAT) 2.5-2.5 MCG/ACT AERS Inhale 2 puffs into the lungs daily. 1 Inhaler 11  . tiZANidine (ZANAFLEX) 4 MG tablet TAKE 1 TABLET(4 MG) BY MOUTH EVERY 8 HOURS AS NEEDED FOR MUSCLE SPASMS 90 tablet 0   No current facility-administered medications on file prior to visit.    Review of Systems  Constitutional: Negative for other unusual  diaphoresis or sweats HENT: Negative for ear discharge or swelling Eyes: Negative for other worsening visual disturbances Respiratory: Negative for stridor or other swelling  Gastrointestinal: Negative for worsening distension or other blood Genitourinary: Negative for retention or other urinary change Musculoskeletal: Negative for other MSK pain or swelling Skin: Negative for color change or other new lesions Neurological: Negative for worsening tremors and other numbness  Psychiatric/Behavioral: Negative for worsening agitation or other fatigue All otherwise neg per pt     Objective:   Physical Exam  BP (!) 164/104   Pulse 89   Temp 98.5 F (36.9 C) (Oral)   Ht 5\' 8"  (1.727 m)   Wt 160 lb (72.6 kg)   SpO2 96%   BMI 24.33 kg/m  VS noted,  Constitutional: Pt appears in NAD HENT: Head: NCAT.  Right Ear: External ear normal.  Left Ear: External ear normal.  Eyes: . Pupils are equal, round, and reactive to light. Conjunctivae and EOM are normal Nose: without d/c or deformity Neck: Neck supple. Gross normal ROM Cardiovascular: Normal rate and regular rhythm.   Pulmonary/Chest: Effort normal and breath sounds without rales or wheezing.  Abd:  Soft, NT, ND, + BS, no organomegaly Neurological: Pt is alert. At baseline orientation, motor grossly intact Skin: Skin is warm. Has erythem scaly nontender rash about 2 x 2 cm left mid medial leg, no other new lesions, no LE edema Psychiatric: Pt behavior is normal without agitation  All otherwise neg per pt  Lab Results  Component Value Date   WBC 8.9 02/24/2019   HGB 14.5 02/24/2019   HCT 42.7 02/24/2019   PLT 248.0 02/24/2019   GLUCOSE 85 02/24/2019   CHOL 213 (H) 02/24/2019   TRIG 205.0 (H) 02/24/2019   HDL 50.60 02/24/2019   LDLDIRECT 137.0 02/24/2019   LDLCALC 105 (H) 12/19/2017   ALT 15 02/24/2019   AST 18 02/24/2019   NA 138 02/24/2019   K 4.2 02/24/2019   CL 103 02/24/2019   CREATININE 1.02 02/24/2019   BUN 12 02/24/2019   CO2 29 02/24/2019   TSH 1.41 02/24/2019   PSA 1.08 02/24/2019       Assessment & Plan:

## 2019-02-24 NOTE — Patient Instructions (Signed)
Please take all new medication as prescribed - the steroid cream for the left leg rash  Please send in the cologuard as you mentioned  Please stop smoking  Please continue all other medications as before, and refills have been done if requested.  Please have the pharmacy call with any other refills you may need.  Please continue your efforts at being more active, low cholesterol diet, and weight control.  Please keep your appointments with your specialists as you may have planned - Dr Melvyn Novas   Please go to the LAB in the Basement (turn left off the elevator) for the tests to be done today  You will be contacted by phone if any changes need to be made immediately.  Otherwise, you will receive a letter about your results with an explanation, but please check with MyChart first.  Please remember to sign up for MyChart if you have not done so, as this will be important to you in the future with finding out test results, communicating by private email, and scheduling acute appointments online when needed.  Please return in 6 months, or sooner if needed

## 2019-02-25 ENCOUNTER — Other Ambulatory Visit: Payer: Self-pay | Admitting: Internal Medicine

## 2019-02-25 MED ORDER — ATORVASTATIN CALCIUM 10 MG PO TABS: 10.0000 mg | ORAL_TABLET | Freq: Every day | ORAL | 3 refills | Status: DC

## 2019-03-01 ENCOUNTER — Encounter: Payer: Self-pay | Admitting: Internal Medicine

## 2019-03-01 NOTE — Assessment & Plan Note (Signed)
Mild dermatitis, for topical steroid cr pr,  to f/u any worsening symptoms or concerns

## 2019-03-01 NOTE — Assessment & Plan Note (Signed)
stable overall by history and exam, recent data reviewed with pt, and pt to continue medical treatment as before,  to f/u any worsening symptoms or concerns  

## 2019-03-24 DIAGNOSIS — G894 Chronic pain syndrome: Secondary | ICD-10-CM | POA: Diagnosis not present

## 2019-03-24 DIAGNOSIS — M545 Low back pain: Secondary | ICD-10-CM | POA: Diagnosis not present

## 2019-03-24 DIAGNOSIS — Z79891 Long term (current) use of opiate analgesic: Secondary | ICD-10-CM | POA: Diagnosis not present

## 2019-05-21 DIAGNOSIS — Z79891 Long term (current) use of opiate analgesic: Secondary | ICD-10-CM | POA: Diagnosis not present

## 2019-05-21 DIAGNOSIS — G894 Chronic pain syndrome: Secondary | ICD-10-CM | POA: Diagnosis not present

## 2019-05-21 DIAGNOSIS — M545 Low back pain: Secondary | ICD-10-CM | POA: Diagnosis not present

## 2019-06-02 ENCOUNTER — Telehealth: Payer: Self-pay | Admitting: Internal Medicine

## 2019-06-02 MED ORDER — STIOLTO RESPIMAT 2.5-2.5 MCG/ACT IN AERS: 2.0000 | INHALATION_SPRAY | Freq: Every day | RESPIRATORY_TRACT | 2 refills | Status: DC

## 2019-06-02 NOTE — Telephone Encounter (Signed)
Caller needs refill stiolto repimat 2.70mcg 2.5mg  per accusation. Reports he takes 2 puss daily. [facility] [address]:

## 2019-06-02 NOTE — Telephone Encounter (Signed)
Dr. Jenny Reichmann this patient is requesting a refill on this medication, but you were not the original prescribing provider.   Is it ok for the patient to receive a refill?

## 2019-06-02 NOTE — Telephone Encounter (Signed)
Done erx 

## 2019-06-03 NOTE — Telephone Encounter (Signed)
Called patient unable to leave voicemail on cell phone. Called home number no one answer

## 2019-07-04 ENCOUNTER — Telehealth: Payer: Self-pay | Admitting: Internal Medicine

## 2019-07-04 MED ORDER — STIOLTO RESPIMAT 2.5-2.5 MCG/ACT IN AERS: 2.0000 | INHALATION_SPRAY | Freq: Every day | RESPIRATORY_TRACT | 1 refills | Status: DC

## 2019-07-04 NOTE — Telephone Encounter (Signed)
Spoke with patient. He stated that he needs a refill on his Stiolto. I advised him that the refills were denied due to him being overdue for an OV. He stated that his last OV was cancelled due to [place]. He has been scheduled for a televisit on 3/19 at 315pm. Refill has been sent to [facility] on [address].   Nothing further needed at time of call.

## 2019-07-18 ENCOUNTER — Ambulatory Visit (INDEPENDENT_AMBULATORY_CARE_PROVIDER_SITE_OTHER): Payer: Medicare HMO | Admitting: Internal Medicine

## 2019-07-18 ENCOUNTER — Other Ambulatory Visit: Payer: Self-pay

## 2019-07-18 ENCOUNTER — Telehealth: Payer: Self-pay | Admitting: Internal Medicine

## 2019-07-18 DIAGNOSIS — J449 Chronic obstructive pulmonary disease, unspecified: Secondary | ICD-10-CM

## 2019-07-18 DIAGNOSIS — F17210 Nicotine dependence, cigarettes, uncomplicated: Secondary | ICD-10-CM | POA: Diagnosis not present

## 2019-07-18 MED ORDER — STIOLTO RESPIMAT 2.5-2.5 MCG/ACT IN AERS: 2.0000 | INHALATION_SPRAY | Freq: Every day | RESPIRATORY_TRACT | 11 refills | Status: DC

## 2019-07-18 MED ORDER — ALBUTEROL SULFATE HFA 108 (90 BASE) MCG/ACT IN AERS: INHALATION_SPRAY | RESPIRATORY_TRACT | 11 refills | Status: DC

## 2019-07-18 NOTE — Patient Instructions (Signed)
We will refer you  to Eric Form NP for lung cancer screening  I strongly recommend you get the covid 19 vaccine as soon as it is offered as it may save both your life and those of your close contacts  Please schedule a follow up visit in 12  months but call sooner if needed

## 2019-07-18 NOTE — Telephone Encounter (Signed)
Called and spoke with pt. Pt stated they missed a phone call but does not know who it was from other than someone at our office.  Pt is scheduled for a televisit at 3:15 with MW. Dr. Melvyn Novas, please advise if you might have tried to call pt early to do the televisit as he is unsure who tried to call him.

## 2019-07-18 NOTE — Progress Notes (Signed)
Subjective:     Patient ID: Cody Grant, male   DOB: Jun 30, 1960,    MRN: [id number]    Brief patient profile:  40  yowm active smoker with GOLD II copd with large reversible component 2015 referred to pulmonary clinic 01/25/2017 by Dr   Jenny Reichmann    History of Present Illness  01/25/2017 1st [facility] office visit/ Cody Grant   Chief Complaint  Patient presents with  . Pulmonary Consult    Referred by Dr. Cathlean Cower. Pt dxed with COPD in 2016. He states he has trouble with his breathing with hot/humid weather. He tends to have trouble in the am's, and has a non prod cough. He states he went a period of time without his spiriva due to cost and breathing got worse, but has improved since he started this back recently. He uses proair and also albuterol neb both 1 x per wk on average.   limited more by L radicular back  pain than   sob maint on spiriva dpi which he assoc with upper airway pattern coughing  Recurrent acute cough flares 2 x year then needs neb more then but usually not needing  Much saba unless he overdoes it out in the heat   rec Plan A = Automatic = Stiolto 2 pffs each am  Work on inhaler technique:  relax and gently blow all the way out then take a nice smooth deep breath back in, triggering the inhaler at same time you start breathing in.  Hold for up to 5 seconds if you can. Blow out thru nose. Rinse and gargle with water when done Plan B = Backup Only use your albuterol as a rescue medication Plan C = Crisis - only use your albuterol nebulizer if you first try Plan B and it fails to help > ok to use the nebulizer up to every 4 hours but if start needing it regularly call for immediate appointment     03/09/2017  f/u ov/Cody Grant re:  Copd GOLD III // still smoking 7 cigs per day / not using stiolto consistently  Chief Complaint  Patient presents with  . Follow-up    PFT's done today.  He states that he has good days and bad days. He is using his proair 4 x per wk on average.     lot of cough/gagging worse at hs but usually sleeps/wakes ok  then after coffee starts back up  [facility] = can't walk a nl pace on a flat grade s sob but does fine slow and flat eg walking flat/slow  Only started stiolto a few days prior to OV  Since wanted to use up his spiriva supply first  rec Plan A = Automatic = Stiolto 2 pffs each am  Plan B = Backup Only use your albuterol as a rescue medication The key is to stop smoking completely before smoking completely stops you!      09/06/2017  f/u ov/Cody Grant re:  GOLD III/ still smoking  Chief Complaint  Patient presents with  . Follow-up    Breathing had been worse here recently- relates to the pollen. He states that this happens every Spring. He rarely uses his albuterol inhaler but he has been using neb about 2 x per day.   Dyspnea:  Still = MMRC2 = can't walk a nl pace on a flat grade s sob but does fine slow and flat  Cough: mostly dry  Sleep: sleep ok once gets to  SABA use:  Was using  a lot with"pollen" including nebs, not now  Wake ups to coffee and cig daily then starts coughing mostly dry  Also cough immediately  at hs  But not waking him up noct or in am  rec Plan A = Automatic = Stiolto 2 pffs each am  Plan B = Backup Only use your albuterol (proair) as a rescue medication Plan C = Crisis - only use your albuterol nebulizer if you first try Plan B and it fails to help > ok to use the nebulizer up to every 4 hours but if start needing it regularly call for immediate appointment Plan D = Doctor When you start noticing more symptoms or need for albuterol :  Prednisone 10 mg take  4 each am x 2 days,   2 each am x 2 days,  1 each am x 2 days and stop    03/11/2018  f/u ov/Cody Grant re:  COPD III/ still smoking  Chief Complaint  Patient presents with  . Follow-up    Cough since "change in the weather"- non prod and tends to be worse at night. He rarely uses his albuterol inhaler or neb.   Dyspnea:  MMRC2 = can't walk a nl pace on a  flat grade s sob but does fine slow and flat  Cough: hs and then in am almost goes into fit gagging x 20 min  Then better p coughing up thick white mucus   Sleeping: flat/ on side / one pillow  SABA use: minimum 02: no  rec  Plan A = Automatic = Stiolto 2 pffs each am  (symbicort 160 x 2 every 12 hours plus Spiriva 1.25 2 puffs every 12 hours until you use it up)   Plan B = Backup Only use your albuterol (proair)  Plan C = Crisis - only use your albuterol nebulizer if you first try Plan B Plan D = Deltasone  When you start noticing more symptoms or need for albuterol :  Prednisone 10 mg take  4 each am x 2 days,   2 each am x 2 days,  1 each am x 2 days and stop Take  mucinex dm up to 600 mg x 1200 12 hours and supplement if needed with oxyir  up to 2 every 4 hours  The key is to stop smoking completely before smoking completely stops you!  Although I certainly  don't endorse regular use of e cigs   I emphasized they should be considered a quick "one-way bridge" off all tobacco products and that he only use the commercial brands in the very short term and stop immediately if any resp symptoms worsen        04/10/2018  f/u ov/Cody Grant re:  COPD GOLD III / still smoking Chief Complaint  Patient presents with  . Follow-up    Cough has improved some. He is cutting back on his smoking and plans to quit. He rarely uses his proair or neb.   Dyspnea:  MMRC2 = can't walk a nl pace on a flat grade s sob but does fine slow and flat   Cough: some each am clear minimum mucus x 10 min worse in cold weather  Sleeping: on side on pillow  SABA use: min/ no pred either  02: no  rec No change rx   Virtual Visit via Telephone Note 07/18/2019   I connected with Cody Grant on 07/18/19 at  3:15 PM EDT by telephone and verified that I am speaking with the  correct person using two identifiers.   I discussed the limitations, risks, security and privacy concerns of performing an evaluation and management  service by telephone and the availability of in person appointments. I also discussed with the patient that there may be a patient responsible charge related to this service. The patient expressed understanding and agreed to proceed.   History of Present Illness: COPD  GOLD III/ still smoking - rxStiolto  Dyspnea:  MMRC2 = can't walk a nl pace on a flat grade s sob but does fine slow and flat / worse if real cold/hot  Cough: min am mucoid Sleeping: one pillow / flat bed  SABA use: variable hfa/ never neb 02: none   No obvious day to day or daytime variability or assoc excess/ purulent sputum or mucus plugs or hemoptysis or cp or chest tightness, subjective wheeze or overt sinus or hb symptoms.    Also denies any obvious fluctuation of symptoms with weather or environmental changes or other aggravating or alleviating factors except as outlined above.   Meds reviewed/ med reconciliation completed         Observations/Objective: Speaking full sentences, no sob, good voice texture  Assessment and Plan: See problem list for active a/p's   Follow Up Instructions: See avs for instructions unique to this ov which includes revised/ updated med list     I discussed the assessment and treatment plan with the patient. The patient was provided an opportunity to ask questions and all were answered. The patient agreed with the plan and demonstrated an understanding of the instructions.   The patient was advised to call back or seek an in-person evaluation if the symptoms worsen or if the condition fails to improve as anticipated.  I provided 15 minutes of non-face-to-face time during this encounter.   Christinia Gully, MD

## 2019-07-20 ENCOUNTER — Encounter: Payer: Self-pay | Admitting: Internal Medicine

## 2019-07-20 NOTE — Assessment & Plan Note (Signed)
Active smoker PFT's   05/20/2013   FEV1 1.95 (54 % ) ratio 59  p 39 % improvement from saba p ? prior to study with DLCO  62 % corrects to 85  % for alv volume   - 01/25/2017    try stiolto 2 each am  - PFT's  03/09/2017  FEV1 1.65 (48 % ) ratio 54  p 1 % improvement from saba p stiolto  prior to study with DLCO  56/57 % corrects to 74 % for alv volume   - 03/11/2018 added prn prednisone x 6 days for refractory symptoms - 03/11/2018  After extensive coaching inhaler device,  effectiveness =    90% with smi and hfa  - 04/10/2018   Walked RA  2 laps @ 26ft each @ avg pace  stopped due to end of study no desats  Pt is Group B in terms of symptom/risk and laba/lama therefore appropriate rx at this point >>>  Continue anoro    Low-dose CT lung cancer screening is recommended for patients who are 72-86 years of age with a 30+ pack-year history of smoking, and who are currently smoking or quit <=15 years ago.   >>> referred to Eric Form NP for shared decision making

## 2019-07-20 NOTE — Assessment & Plan Note (Signed)
Counseled re importance of smoking cessation but did not meet time criteria for separate billing    Each maintenance medication was reviewed in detail including most importantly the difference between maintenance and as needed and under what circumstances the prns are to be used.  Please see AVS for specific  Instructions which are unique to this visit and I personally typed out  which were reviewed in detail over the phone with the patient and a copy provided via MyChart   >>>> f/u yearly

## 2019-07-21 NOTE — Telephone Encounter (Signed)
televist done same date - see notes

## 2019-07-23 DIAGNOSIS — M545 Low back pain: Secondary | ICD-10-CM | POA: Diagnosis not present

## 2019-07-23 DIAGNOSIS — G894 Chronic pain syndrome: Secondary | ICD-10-CM | POA: Diagnosis not present

## 2019-07-23 DIAGNOSIS — Z79891 Long term (current) use of opiate analgesic: Secondary | ICD-10-CM | POA: Diagnosis not present

## 2019-08-25 ENCOUNTER — Other Ambulatory Visit: Payer: Self-pay

## 2019-08-25 ENCOUNTER — Encounter: Payer: Self-pay | Admitting: Internal Medicine

## 2019-08-25 ENCOUNTER — Ambulatory Visit (INDEPENDENT_AMBULATORY_CARE_PROVIDER_SITE_OTHER): Payer: Medicare HMO | Admitting: Internal Medicine

## 2019-08-25 VITALS — BP 160/82 | HR 95 | Temp 98.2°F | Ht 68.0 in | Wt 154.0 lb

## 2019-08-25 DIAGNOSIS — G8929 Other chronic pain: Secondary | ICD-10-CM

## 2019-08-25 DIAGNOSIS — J449 Chronic obstructive pulmonary disease, unspecified: Secondary | ICD-10-CM | POA: Diagnosis not present

## 2019-08-25 DIAGNOSIS — H9193 Unspecified hearing loss, bilateral: Secondary | ICD-10-CM

## 2019-08-25 DIAGNOSIS — Z0001 Encounter for general adult medical examination with abnormal findings: Secondary | ICD-10-CM

## 2019-08-25 DIAGNOSIS — E785 Hyperlipidemia, unspecified: Secondary | ICD-10-CM

## 2019-08-25 DIAGNOSIS — Z0000 Encounter for general adult medical examination without abnormal findings: Secondary | ICD-10-CM | POA: Diagnosis not present

## 2019-08-25 DIAGNOSIS — J45909 Unspecified asthma, uncomplicated: Secondary | ICD-10-CM | POA: Diagnosis not present

## 2019-08-25 DIAGNOSIS — I10 Essential (primary) hypertension: Secondary | ICD-10-CM | POA: Diagnosis not present

## 2019-08-25 HISTORY — DX: Essential (primary) hypertension: I10

## 2019-08-25 MED ORDER — SILDENAFIL CITRATE 100 MG PO TABS: 50.0000 mg | ORAL_TABLET | Freq: Every day | ORAL | 11 refills | Status: DC | PRN

## 2019-08-25 MED ORDER — ALBUTEROL SULFATE HFA 108 (90 BASE) MCG/ACT IN AERS: 2.0000 | INHALATION_SPRAY | Freq: Four times a day (QID) | RESPIRATORY_TRACT | 3 refills | Status: DC | PRN

## 2019-08-25 NOTE — Assessment & Plan Note (Signed)
Suspect essential HTN, but pt declines further tx for now, to work on lifestyle changes

## 2019-08-25 NOTE — Assessment & Plan Note (Signed)
stable overall by history and exam, recent data reviewed with pt, and pt to continue medical treatment as before,  to f/u any worsening symptoms or concerns  

## 2019-08-25 NOTE — Assessment & Plan Note (Addendum)
Improved with irrigation,  to f/u any worsening symptoms or concerns  I spent 31 minutes in addition to time for CPX wellness examination in preparing to see the patient by review of recent labs, imaging and procedures, obtaining and reviewing separately obtained history, communicating with the patient and family or caregiver, ordering medications, tests or procedures, and documenting clinical information in the EHR including the differential Dx, treatment, and any further evaluation and other management of bilateral hearing loss, hTN uncontrolled, hLD uncontrolled, copd, chronic pain, asthma

## 2019-08-25 NOTE — Assessment & Plan Note (Signed)
Stable, conts followed per pain management

## 2019-08-25 NOTE — Assessment & Plan Note (Signed)
Declines statin 

## 2019-08-25 NOTE — Progress Notes (Signed)
Subjective:    Patient ID: Cody Grant, male    DOB: 1961/01/23, 59 y.o.   MRN: [id number]  HPI  Here for wellness and f/u;  Overall doing ok;  Pt denies Chest pain, worsening SOB, DOE, wheezing, orthopnea, PND, worsening LE edema, palpitations, dizziness or syncope.  Pt denies neurological change such as new headache, facial or extremity weakness.  Pt denies polydipsia, polyuria, or low sugar symptoms. Pt states overall good compliance with treatment and medications, good tolerability, and has been trying to follow appropriate diet.  Pt denies worsening depressive symptoms, suicidal ideation or panic. No fever, night sweats, wt loss, loss of appetite, or other constitutional symptoms.  Pt states good ability with ADL's, has low fall risk, home safety reviewed and adequate, no other significant changes in hearing or vision, and only occasionally active with exercise. BP Readings from Last 3 Encounters:  08/25/19 (!) 160/82  02/24/19 (!) 164/104  06/25/18 136/88  Does not want statin or BP med today - wants to work on lifestyle changes.  Aso c/o bilateral hearing loss for the past wk, has had trouble with wax in the past, no pain, fever, vertigo, ST, cough or HA.   Past Medical History:  Diagnosis Date  . Allergic rhinitis, cause unspecified   . Asthma    as child  . Chronic lumbar radiculopathy    left with left drop foot/numbness  . Chronic pain    to see Dr Delynn Flavin  . COPD, severe ([place]) 06/22/2013  . Factor 5 Leiden mutation, heterozygous ([place])   . HTN (hypertension) 08/25/2019  . Left foot drop   . Lumbar disc disease   . Other and unspecified hyperlipidemia 05/20/2013   Past Surgical History:  Procedure Laterality Date  . SPINE SURGERY  12/2010   micro diskectomy    reports that he has been smoking cigarettes. He has a 30.00 pack-year smoking history. He has never used smokeless tobacco. He reports current alcohol use of about 2.0 - 4.0 standard drinks of alcohol per week. He  reports that he does not use drugs. family history includes COPD in his mother; Diabetes in his mother; Pulmonary embolism in his father. No Known Allergies Current Outpatient Medications on File Prior to Visit  Medication Sig Dispense Refill  . albuterol (ACCUNEB) 0.63 MG/3ML nebulizer solution Take 3 mLs (0.63 mg total) by nebulization every 6 (six) hours as needed for wheezing. 75 mL 12  . atorvastatin (LIPITOR) 10 MG tablet Take 1 tablet (10 mg total) by mouth daily. 90 tablet 3  . ibuprofen (ADVIL,MOTRIN) 400 MG tablet Take 1 tablet (400 mg total) by mouth every 8 (eight) hours as needed for pain. 90 tablet 1  . NAPROXEN PO Take 1 tablet by mouth as directed.    Marland Kitchen oxyCODONE (OXY IR/ROXICODONE) 5 MG immediate release tablet Take 1 tablet (5 mg total) by mouth every 8 (eight) hours as needed. May take an extra tablet for severe pain occasionally 100 tablet 0  . Tiotropium Bromide-Olodaterol (STIOLTO RESPIMAT) 2.5-2.5 MCG/ACT AERS Inhale 2 puffs into the lungs daily. 4 g 11  . tiZANidine (ZANAFLEX) 4 MG tablet TAKE 1 TABLET(4 MG) BY MOUTH EVERY 8 HOURS AS NEEDED FOR MUSCLE SPASMS 90 tablet 0  . triamcinolone cream (KENALOG) 0.1 % Apply 1 application topically 2 (two) times daily. 30 g 1   No current facility-administered medications on file prior to visit.   Review of Systems All otherwise neg per pt     Objective:   Physical  Exam BP (!) 160/82 (BP Location: Left Arm, Patient Position: Sitting, Cuff Size: Small)   Pulse 95   Temp 98.2 F (36.8 C) (Oral)   Ht 5\' 8"  (1.727 m)   Wt 154 lb (69.9 kg)   SpO2 96%   BMI 23.42 kg/m  VS noted,  Constitutional: Pt appears in NAD HENT: Head: NCAT.  Right Ear: External ear normal.  Left Ear: External ear normal.  bilat wax impactions irrigated and removed- hearing improved Eyes: . Pupils are equal, round, and reactive to light. Conjunctivae and EOM are normal Nose: without d/c or deformity Neck: Neck supple. Gross normal  ROM Cardiovascular: Normal rate and regular rhythm.   Pulmonary/Chest: Effort normal and breath sounds without rales or wheezing.  Abd:  Soft, NT, ND, + BS, no organomegaly Neurological: Pt is alert. At baseline orientation, motor grossly intact Skin: Skin is warm. No rashes, other new lesions, no LE edema Psychiatric: Pt behavior is normal without agitation  All otherwise neg per pt Lab Results  Component Value Date   WBC 8.9 02/24/2019   HGB 14.5 02/24/2019   HCT 42.7 02/24/2019   PLT 248.0 02/24/2019   GLUCOSE 85 02/24/2019   CHOL 213 (H) 02/24/2019   TRIG 205.0 (H) 02/24/2019   HDL 50.60 02/24/2019   LDLDIRECT 137.0 02/24/2019   LDLCALC 105 (H) 12/19/2017   ALT 15 02/24/2019   AST 18 02/24/2019   NA 138 02/24/2019   K 4.2 02/24/2019   CL 103 02/24/2019   CREATININE 1.02 02/24/2019   BUN 12 02/24/2019   CO2 29 02/24/2019   TSH 1.41 02/24/2019   PSA 1.08 02/24/2019       Assessment & Plan:

## 2019-08-25 NOTE — Assessment & Plan Note (Signed)
stable overall by history and exam, recent data reviewed with pt, and pt to continue medical treatment as before,  to f/u any worsening symptoms or concerns, also followed per pulm

## 2019-08-25 NOTE — Assessment & Plan Note (Addendum)

## 2019-08-25 NOTE — Patient Instructions (Signed)
Your ears were irrigated of wax today  Please continue all other medications as before, and refills have been done if requested.  Please have the pharmacy call with any other refills you may need.  Please continue your efforts at being more active, low cholesterol diet, and weight control.  You are otherwise up to date with prevention measures today.  Please keep your appointments with your specialists as you may have planned  Please make an Appointment to return in 6 months, or sooner if needed

## 2019-09-03 ENCOUNTER — Other Ambulatory Visit: Payer: Self-pay | Admitting: *Deleted

## 2019-09-03 MED ORDER — STIOLTO RESPIMAT 2.5-2.5 MCG/ACT IN AERS: 2.0000 | INHALATION_SPRAY | Freq: Every day | RESPIRATORY_TRACT | 11 refills | Status: DC

## 2019-09-19 DIAGNOSIS — Z79891 Long term (current) use of opiate analgesic: Secondary | ICD-10-CM | POA: Diagnosis not present

## 2019-09-19 DIAGNOSIS — M545 Low back pain: Secondary | ICD-10-CM | POA: Diagnosis not present

## 2019-09-19 DIAGNOSIS — G894 Chronic pain syndrome: Secondary | ICD-10-CM | POA: Diagnosis not present

## 2019-11-06 DIAGNOSIS — M62830 Muscle spasm of back: Secondary | ICD-10-CM | POA: Diagnosis not present

## 2019-11-06 DIAGNOSIS — Z79899 Other long term (current) drug therapy: Secondary | ICD-10-CM | POA: Diagnosis not present

## 2019-11-06 DIAGNOSIS — M546 Pain in thoracic spine: Secondary | ICD-10-CM | POA: Diagnosis not present

## 2019-11-06 DIAGNOSIS — F17210 Nicotine dependence, cigarettes, uncomplicated: Secondary | ICD-10-CM | POA: Diagnosis not present

## 2019-11-06 DIAGNOSIS — M542 Cervicalgia: Secondary | ICD-10-CM | POA: Diagnosis not present

## 2019-11-06 DIAGNOSIS — M5416 Radiculopathy, lumbar region: Secondary | ICD-10-CM | POA: Diagnosis not present

## 2019-12-18 DIAGNOSIS — F17210 Nicotine dependence, cigarettes, uncomplicated: Secondary | ICD-10-CM | POA: Diagnosis not present

## 2019-12-18 DIAGNOSIS — M5416 Radiculopathy, lumbar region: Secondary | ICD-10-CM | POA: Diagnosis not present

## 2019-12-18 DIAGNOSIS — M546 Pain in thoracic spine: Secondary | ICD-10-CM | POA: Diagnosis not present

## 2019-12-18 DIAGNOSIS — Z79899 Other long term (current) drug therapy: Secondary | ICD-10-CM | POA: Diagnosis not present

## 2019-12-18 DIAGNOSIS — M542 Cervicalgia: Secondary | ICD-10-CM | POA: Diagnosis not present

## 2019-12-18 DIAGNOSIS — M62830 Muscle spasm of back: Secondary | ICD-10-CM | POA: Diagnosis not present

## 2020-01-16 DIAGNOSIS — Z79899 Other long term (current) drug therapy: Secondary | ICD-10-CM | POA: Diagnosis not present

## 2020-01-16 DIAGNOSIS — M542 Cervicalgia: Secondary | ICD-10-CM | POA: Diagnosis not present

## 2020-01-16 DIAGNOSIS — M546 Pain in thoracic spine: Secondary | ICD-10-CM | POA: Diagnosis not present

## 2020-01-16 DIAGNOSIS — F17210 Nicotine dependence, cigarettes, uncomplicated: Secondary | ICD-10-CM | POA: Diagnosis not present

## 2020-01-16 DIAGNOSIS — M129 Arthropathy, unspecified: Secondary | ICD-10-CM | POA: Diagnosis not present

## 2020-01-16 DIAGNOSIS — Z1159 Encounter for screening for other viral diseases: Secondary | ICD-10-CM | POA: Diagnosis not present

## 2020-01-16 DIAGNOSIS — E559 Vitamin D deficiency, unspecified: Secondary | ICD-10-CM | POA: Diagnosis not present

## 2020-01-16 DIAGNOSIS — Z20822 Contact with and (suspected) exposure to covid-19: Secondary | ICD-10-CM | POA: Diagnosis not present

## 2020-02-13 DIAGNOSIS — F17210 Nicotine dependence, cigarettes, uncomplicated: Secondary | ICD-10-CM | POA: Diagnosis not present

## 2020-02-13 DIAGNOSIS — M542 Cervicalgia: Secondary | ICD-10-CM | POA: Diagnosis not present

## 2020-02-13 DIAGNOSIS — Z79899 Other long term (current) drug therapy: Secondary | ICD-10-CM | POA: Diagnosis not present

## 2020-02-13 DIAGNOSIS — M5416 Radiculopathy, lumbar region: Secondary | ICD-10-CM | POA: Diagnosis not present

## 2020-02-13 DIAGNOSIS — M546 Pain in thoracic spine: Secondary | ICD-10-CM | POA: Diagnosis not present

## 2020-02-24 ENCOUNTER — Encounter: Payer: Self-pay | Admitting: Internal Medicine

## 2020-02-24 ENCOUNTER — Ambulatory Visit (INDEPENDENT_AMBULATORY_CARE_PROVIDER_SITE_OTHER): Payer: Medicare HMO | Admitting: Internal Medicine

## 2020-02-24 ENCOUNTER — Other Ambulatory Visit: Payer: Self-pay

## 2020-02-24 VITALS — BP 150/100 | HR 92 | Temp 98.5°F | Ht 68.0 in | Wt 144.0 lb

## 2020-02-24 DIAGNOSIS — F419 Anxiety disorder, unspecified: Secondary | ICD-10-CM | POA: Diagnosis not present

## 2020-02-24 DIAGNOSIS — I10 Essential (primary) hypertension: Secondary | ICD-10-CM | POA: Diagnosis not present

## 2020-02-24 DIAGNOSIS — E785 Hyperlipidemia, unspecified: Secondary | ICD-10-CM | POA: Diagnosis not present

## 2020-02-24 DIAGNOSIS — F17210 Nicotine dependence, cigarettes, uncomplicated: Secondary | ICD-10-CM | POA: Diagnosis not present

## 2020-02-24 DIAGNOSIS — E559 Vitamin D deficiency, unspecified: Secondary | ICD-10-CM

## 2020-02-24 DIAGNOSIS — J45909 Unspecified asthma, uncomplicated: Secondary | ICD-10-CM | POA: Diagnosis not present

## 2020-02-24 DIAGNOSIS — J449 Chronic obstructive pulmonary disease, unspecified: Secondary | ICD-10-CM

## 2020-02-24 DIAGNOSIS — G8929 Other chronic pain: Secondary | ICD-10-CM

## 2020-02-24 NOTE — Progress Notes (Addendum)
Subjective:    Patient ID: Cody Grant, male    DOB: 05-28-1960, 59 y.o.   MRN: [id number]  HPI  Here to f/u; overall doing ok,  Pt denies chest pain, increasing sob or doe, wheezing, orthopnea, PND, increased LE swelling, palpitations, dizziness or syncope.  Pt denies new neurological symptoms such as new headache, or facial or extremity weakness or numbness.  Pt denies polydipsia, polyuria, or low sugar episode.  Pt states overall good compliance with meds, mostly trying to follow appropriate diet, with wt overall stable,  but little exercise however. Did not take the statin, wants to continue diet alone.  BP at home < 140/90 but not checked recently, but has had ongoing pain, stress and does not want BP med change Past Medical History:  Diagnosis Date   Allergic rhinitis, cause unspecified    Asthma    as child   Chronic lumbar radiculopathy    left with left drop foot/numbness   Chronic pain    to see Dr Delynn Flavin   COPD, severe ([place]) 06/22/2013   Factor 5 Leiden mutation, heterozygous ([place])    HTN (hypertension) 08/25/2019   Left foot drop    Lumbar disc disease    Other and unspecified hyperlipidemia 05/20/2013   Past Surgical History:  Procedure Laterality Date   SPINE SURGERY  12/2010   micro diskectomy    reports that he has been smoking cigarettes. He has a 30.00 pack-year smoking history. He has never used smokeless tobacco. He reports current alcohol use of about 2.0 - 4.0 standard drinks of alcohol per week. He reports that he does not use drugs. family history includes COPD in his mother; Diabetes in his mother; Pulmonary embolism in his father. No Known Allergies Current Outpatient Medications on File Prior to Visit  Medication Sig Dispense Refill   albuterol (ACCUNEB) 0.63 MG/3ML nebulizer solution Take 3 mLs (0.63 mg total) by nebulization every 6 (six) hours as needed for wheezing. 75 mL 12   albuterol (VENTOLIN HFA) 108 (90 Base) MCG/ACT inhaler Inhale  2 puffs into the lungs every 6 (six) hours as needed for wheezing or shortness of breath. 54 g 3   atorvastatin (LIPITOR) 10 MG tablet Take 1 tablet (10 mg total) by mouth daily. 90 tablet 3   ibuprofen (ADVIL,MOTRIN) 400 MG tablet Take 1 tablet (400 mg total) by mouth every 8 (eight) hours as needed for pain. 90 tablet 1   NAPROXEN PO Take 1 tablet by mouth as directed.     NARCAN 4 MG/0.1ML LIQD nasal spray kit      oxyCODONE (OXY IR/ROXICODONE) 5 MG immediate release tablet Take 1 tablet (5 mg total) by mouth every 8 (eight) hours as needed. May take an extra tablet for severe pain occasionally 100 tablet 0   sildenafil (VIAGRA) 100 MG tablet Take 0.5-1 tablets (50-100 mg total) by mouth daily as needed for erectile dysfunction. 5 tablet 11   Tiotropium Bromide-Olodaterol (STIOLTO RESPIMAT) 2.5-2.5 MCG/ACT AERS Inhale 2 puffs into the lungs daily. 4 g 11   tiZANidine (ZANAFLEX) 4 MG tablet TAKE 1 TABLET(4 MG) BY MOUTH EVERY 8 HOURS AS NEEDED FOR MUSCLE SPASMS 90 tablet 0   triamcinolone cream (KENALOG) 0.1 % Apply 1 application topically 2 (two) times daily. 30 g 1   Vitamin D, Ergocalciferol, (DRISDOL) 1.25 MG (50000 UNIT) CAPS capsule      No current facility-administered medications on file prior to visit.   Review of Systems All otherwise neg per pt  Objective:   Physical Exam BP (!) 150/100 (BP Location: Left Arm, Patient Position: Sitting, Cuff Size: Large)    Pulse 92    Temp 98.5 F (36.9 C) (Oral)    Ht _0  (1.727 m)    Wt 144 lb (65.3 kg)    SpO2 94%    BMI 21.90 kg/m  VS noted,  Constitutional: Pt appears in NAD HENT: Head: NCAT.  Right Ear: External ear normal.  Left Ear: External ear normal.  Eyes: . Pupils are equal, round, and reactive to light. Conjunctivae and EOM are normal Nose: without d/c or deformity Neck: Neck supple. Gross normal ROM Cardiovascular: Normal rate and regular rhythm.   Pulmonary/Chest: Effort normal and breath sounds without rales  or wheezing.  Abd:  Soft, NT, ND, + BS, no organomegaly Neurological: Pt is alert. At baseline orientation, motor grossly intact Skin: Skin is warm. No rashes, other new lesions, no LE edema Psychiatric: Pt behavior is normal without agitation  All otherwise neg per pt Lab Results  Component Value Date   WBC 8.9 02/24/2019   HGB 14.5 02/24/2019   HCT 42.7 02/24/2019   PLT 248.0 02/24/2019   GLUCOSE 85 02/24/2019   CHOL 213 (H) 02/24/2019   TRIG 205.0 (H) 02/24/2019   HDL 50.60 02/24/2019   LDLDIRECT 137.0 02/24/2019   LDLCALC 105 (H) 12/19/2017   ALT 15 02/24/2019   AST 18 02/24/2019   NA 138 02/24/2019   K 4.2 02/24/2019   CL 103 02/24/2019   CREATININE 1.02 02/24/2019   BUN 12 02/24/2019   CO2 29 02/24/2019   TSH 1.41 02/24/2019   PSA 1.08 02/24/2019      Assessment & Plan:

## 2020-02-24 NOTE — Assessment & Plan Note (Signed)
Counseled to quit 

## 2020-02-24 NOTE — Assessment & Plan Note (Signed)
Declines statin,  to f/u any worsening symptoms or concerns

## 2020-02-24 NOTE — Assessment & Plan Note (Signed)
Worse recently but no panic, declines change in tx

## 2020-02-24 NOTE — Assessment & Plan Note (Signed)
Cont pain management at [facility]

## 2020-02-24 NOTE — Patient Instructions (Signed)
Please continue all other medications as before, and refills have been done if requested.  Please have the pharmacy call with any other refills you may need.  Please continue your efforts at being more active, low cholesterol diet, and weight control.  Please keep your appointments with your specialists as you may have planned  You will be contacted regarding the referral for: the "LDCT" low dose CT scan program  Please make an Appointment to return in 6 months, or sooner if needed

## 2020-02-24 NOTE — Assessment & Plan Note (Signed)
Possibly reactive or white coat, for f/u BP at home and next visit

## 2020-02-24 NOTE — Assessment & Plan Note (Signed)
stable overall by history and exam, recent data reviewed with pt, and pt to continue medical treatment as before,  to f/u any worsening symptoms or concerns  

## 2020-02-24 NOTE — Assessment & Plan Note (Addendum)
stable overall by history and exam, recent data reviewed with pt, and pt to continue medical treatment as before,  to f/u any worsening symptoms or concerns, but for refer pulm for LDCT screening  I spent 41 minutes in preparing to see the patient by review of recent labs, imaging and procedures, obtaining and reviewing separately obtained history, communicating with the patient and family or caregiver, ordering medications, tests or procedures, and documenting clinical information in the EHR including the differential Dx, treatment, and any further evaluation and other management of copd, chronic pain, anxiety, asthma, htn, hld, vit d def, smoking

## 2020-02-24 NOTE — Assessment & Plan Note (Signed)
Cont oral replacement 

## 2020-02-25 ENCOUNTER — Telehealth: Payer: Self-pay | Admitting: Internal Medicine

## 2020-02-26 ENCOUNTER — Other Ambulatory Visit: Payer: Self-pay

## 2020-02-26 ENCOUNTER — Ambulatory Visit: Payer: Medicare HMO | Admitting: Internal Medicine

## 2020-02-26 ENCOUNTER — Encounter: Payer: Self-pay | Admitting: Internal Medicine

## 2020-02-26 DIAGNOSIS — J449 Chronic obstructive pulmonary disease, unspecified: Secondary | ICD-10-CM | POA: Diagnosis not present

## 2020-02-26 DIAGNOSIS — F17210 Nicotine dependence, cigarettes, uncomplicated: Secondary | ICD-10-CM

## 2020-02-26 NOTE — Assessment & Plan Note (Addendum)
Counseled re importance of smoking cessation but did not meet time criteria for separate billing    Low-dose CT lung cancer screening is recommended for patients who are 21-59 years of age with a 30+ pack-year history of smoking, and who are currently smoking or quit <=15 years ago > referred for early decision making   Also: Pt informed of the seriousness of COVID 19 infection as a direct risk to lung health  and safey and to close contacts and should continue to wear a facemask in public and minimize exposure to public locations but especially avoid any area or activity where non-close contacts are not observing distancing or wearing an appropriate face mask.  I strongly recommended she take either of the vaccines available through local drugstores based on updated information on millions of Americans treated with the [place] products  which have proven both safe and  effective even against the new delta variant.    He has bad case of excess intake of misinformation of risk/beneif of the vaccine vs getting the virus and wants antibodies checked before reconsidering as thinks he's already had Covid and banking on naturally acquired immunity > agreed to check IgG then readdress this issue over the phone.          Each maintenance medication was reviewed in detail including emphasizing most importantly the difference between maintenance and prns and under what circumstances the prns are to be triggered using an action plan format where appropriate.  Total time for H and P, chart review, counseling, reviewing smi device and generating customized AVS unique to this office visit / charting = 20 min

## 2020-02-26 NOTE — Patient Instructions (Addendum)
Sample of stiolto   Please remember to go to the lab department   for your tests - we will call you with the results when they are available.  We will refer you  to Eric Form NP for lung cancer screening   We will have a pharmacist soon who is working with [place] to help patients find affordable alternatives to stiolto   Please schedule a follow up visit in 12 months but call sooner if needed

## 2020-02-26 NOTE — Assessment & Plan Note (Signed)
Active smoker PFT's   05/20/2013   FEV1 1.95 (54 % ) ratio 59  p 39 % improvement from saba p ? prior to study with DLCO  62 % corrects to 85  % for alv volume   - 01/25/2017    try stiolto 2 each am  - PFT's  03/09/2017  FEV1 1.65 (48 % ) ratio 54  p 1 % improvement from saba p stiolto  prior to study with DLCO  56/57 % corrects to 74 % for alv volume   - 03/11/2018 added prn prednisone x 6 days for refractory symptoms - 03/11/2018  After extensive coaching inhaler device,  effectiveness =    90% with smi and hfa  - 04/10/2018   Walked RA  2 laps @ 252ft each @ avg pace  stopped due to end of study no desats -  [facility] AT screen 02/26/2020 >>>   Active smoker who  is Group B in terms of symptom/risk and laba/lama therefore appropriate rx at this point >>>  stiolto approp but having trouble affording it due to the donut hole  Advised:  formulary restrictions will be an ongoing challenge for the forseable future and I would be happy to pick an alternative if the pt will first  provide me a list of them -  pt  will need to return here for training for any new device that is required eg dpi vs hfa vs respimat.    In the meantime we can always provide samples so that the patient never runs out of any needed respiratory medications.   Also: I spent extra time with pt today reviewing appropriate use of albuterol for prn use on exertion with the following points: 1) saba is for relief of sob that does not improve by walking a slower pace or resting but rather if the pt does not improve after trying this first. 2) If the pt is convinced, as many are, that saba helps recover from activity faster then it's easy to tell if this is the case by re-challenging : ie stop, take the inhaler, then p 5 minutes try the exact same activity (intensity of workload) that just caused the symptoms and see if they are substantially diminished or not after saba 3) if there is an activity that reproducibly causes the symptoms,  try the saba 15 min before the activity on alternate days   If in fact the saba really does help, then fine to continue to use it prn but advised may need to look closer at the maintenance regimen being used to achieve better control of airways disease with exertion.

## 2020-02-26 NOTE — Progress Notes (Signed)
Subjective:     Patient ID: Cody Grant, male   DOB: 30-Aug-1960,    MRN: [id number]    Brief patient profile:  51  yowm from [facility]  active smoker with GOLD II copd with large reversible component 2015 referred to pulmonary clinic 01/25/2017 by Dr   Jonny Ruiz    History of Present Illness  01/25/2017 1st [facility] office visit/ Cody Grant   Chief Complaint  Patient presents with  . Pulmonary Consult    Referred by Dr. Oliver Barre. Pt dxed with COPD in 2016. He states he has trouble with his breathing with hot/humid weather. He tends to have trouble in the am's, and has a non prod cough. He states he went a period of time without his spiriva due to cost and breathing got worse, but has improved since he started this back recently. He uses proair and also albuterol neb both 1 x per wk on average.   limited more by L radicular back  pain than   sob maint on spiriva dpi which he assoc with upper airway pattern coughing  Recurrent acute cough flares 2 x year then needs neb more then but usually not needing  Much saba unless he overdoes it out in the heat   rec Plan A = Automatic = Stiolto 2 pffs each am  Work on inhaler technique:  relax and gently blow all the way out then take a nice smooth deep breath back in, triggering the inhaler at same time you start breathing in.  Hold for up to 5 seconds if you can. Blow out thru nose. Rinse and gargle with water when done Plan B = Backup Only use your albuterol as a rescue medication Plan C = Crisis - only use your albuterol nebulizer if you first try Plan B and it fails to help > ok to use the nebulizer up to every 4 hours but if start needing it regularly call for immediate appointment     03/09/2017  f/u ov/Cody Grant re:  Copd GOLD III // still smoking 7 cigs per day / not using stiolto consistently  Chief Complaint  Patient presents with  . Follow-up    PFT's done today.  He states that he has good days and bad days. He is using his proair 4 x per wk  on average.    lot of cough/gagging worse at hs but usually sleeps/wakes ok  then after coffee starts back up  [facility] = can't walk a nl pace on a flat grade s sob but does fine slow and flat eg walking flat/slow  Only started stiolto a few days prior to OV  Since wanted to use up his spiriva supply first  rec Plan A = Automatic = Stiolto 2 pffs each am  Plan B = Backup Only use your albuterol as a rescue medication The key is to stop smoking completely before smoking completely stops you!      09/06/2017  f/u ov/Cody Grant re:  GOLD III/ still smoking  Chief Complaint  Patient presents with  . Follow-up    Breathing had been worse here recently- relates to the pollen. He states that this happens every Spring. He rarely uses his albuterol inhaler but he has been using neb about 2 x per day.   Dyspnea:  Still = MMRC2 = can't walk a nl pace on a flat grade s sob but does fine slow and flat  Cough: mostly dry  Sleep: sleep ok once gets to  SABA use:  Was using a lot with"pollen" including nebs, not now  Wake ups to coffee and cig daily then starts coughing mostly dry  Also cough immediately  at hs  But not waking him up noct or in am  rec Plan A = Automatic = Stiolto 2 pffs each am  Plan B = Backup Only use your albuterol (proair) as a rescue medication Plan C = Crisis - only use your albuterol nebulizer if you first try Plan B and it fails to help > ok to use the nebulizer up to every 4 hours but if start needing it regularly call for immediate appointment Plan D = Doctor When you start noticing more symptoms or need for albuterol :  Prednisone 10 mg take  4 each am x 2 days,   2 each am x 2 days,  1 each am x 2 days and stop    03/11/2018  f/u ov/Cody Grant re:  COPD III/ still smoking  Chief Complaint  Patient presents with  . Follow-up    Cough since "change in the weather"- non prod and tends to be worse at night. He rarely uses his albuterol inhaler or neb.   Dyspnea:  MMRC2 = can't walk a  nl pace on a flat grade s sob but does fine slow and flat  Cough: hs and then in am almost goes into fit gagging x 20 min  Then better p coughing up thick white mucus   Sleeping: flat/ on side / one pillow  SABA use: minimum 02: no  rec  Plan A = Automatic = Stiolto 2 pffs each am  (symbicort 160 x 2 every 12 hours plus Spiriva 1.25 2 puffs every 12 hours until you use it up)   Plan B = Backup Only use your albuterol (proair)  Plan C = Crisis - only use your albuterol nebulizer if you first try Plan B Plan D = Deltasone  When you start noticing more symptoms or need for albuterol :  Prednisone 10 mg take  4 each am x 2 days,   2 each am x 2 days,  1 each am x 2 days and stop Take  mucinex dm up to 600 mg x 1200 12 hours and supplement if needed with oxyir  up to 2 every 4 hours  The key is to stop smoking completely before smoking completely stops you!  Although I certainly  don't endorse regular use of e cigs   I emphasized they should be considered a quick "one-way bridge" off all tobacco products and that he only use the commercial brands in the very short term and stop immediately if any resp symptoms worsen     televist  07/18/19  rec We will refer you  to Cody Form NP for lung cancer screening I strongly recommend you get the covid 19 vaccine as soon as it is offered as it may save both your life and those of your close contacts Please schedule a follow up visit in 12  months but call sooner if needed    02/26/2020  f/u ov/Cody Grant re: copd gold III/ still smoking/ can't afford stiolto / ?  needs  Chief Complaint  Patient presents with  . Follow-up    no complaints   Dyspnea:  MMRC2 = can't walk a nl pace on a flat grade s sob but does fine slow and flat  Cough: none  Sleeping: on side / one pillow SABA use: min recently as long as has access to  Stiolto  02: none    No obvious day to day or daytime variability or assoc excess/ purulent sputum or mucus plugs or hemoptysis or cp  or chest tightness, subjective wheeze or overt sinus or hb symptoms.   Sleeping  without nocturnal  or early am exacerbation  of respiratory  c/o's or need for noct saba. Also denies any obvious fluctuation of symptoms with weather or environmental changes or other aggravating or alleviating factors except as outlined above   No unusual exposure hx or h/o childhood pna/ asthma or knowledge of premature birth.  Current Allergies, Complete Past Medical History, Past Surgical History, Family History, and Social History were reviewed in [employer] record.  ROS  The following are not active complaints unless bolded Hoarseness, sore throat, dysphagia, dental problems, itching, sneezing,  nasal congestion or discharge of excess mucus or purulent secretions, ear ache,   fever, chills, sweats, unintended wt loss or wt gain, classically pleuritic or exertional cp,  orthopnea pnd or arm/hand swelling  or leg swelling, presyncope, palpitations, abdominal pain, anorexia, nausea, vomiting, diarrhea  or change in bowel habits or change in bladder habits, change in stools or change in urine, dysuria, hematuria,  rash, arthralgias, visual complaints, headache, numbness, weakness or ataxia or problems with walking or coordination,  change in mood or  memory.        Current Meds  Medication Sig  . albuterol (ACCUNEB) 0.63 MG/3ML nebulizer solution Take 3 mLs (0.63 mg total) by nebulization every 6 (six) hours as needed for wheezing.  Marland Kitchen albuterol (VENTOLIN HFA) 108 (90 Base) MCG/ACT inhaler Inhale 2 puffs into the lungs every 6 (six) hours as needed for wheezing or shortness of breath.  Marland Kitchen ibuprofen (ADVIL,MOTRIN) 400 MG tablet Take 1 tablet (400 mg total) by mouth every 8 (eight) hours as needed for pain.  Marland Kitchen NAPROXEN PO Take 1 tablet by mouth as directed.  Marland Kitchen NARCAN 4 MG/0.1ML LIQD nasal spray kit   . oxyCODONE (OXY IR/ROXICODONE) 5 MG immediate release tablet Take 1 tablet (5 mg total) by  mouth every 8 (eight) hours as needed. May take an extra tablet for severe pain occasionally  . sildenafil (VIAGRA) 100 MG tablet Take 0.5-1 tablets (50-100 mg total) by mouth daily as needed for erectile dysfunction.  . Tiotropium Bromide-Olodaterol (STIOLTO RESPIMAT) 2.5-2.5 MCG/ACT AERS Inhale 2 puffs into the lungs daily.  . Vitamin D, Ergocalciferol, (DRISDOL) 1.25 MG (50000 UNIT) CAPS capsule                      Objective:   Physical Exam   amb wm nad   02/26/2020      144  04/10/2018       162  03/11/2018      161  09/06/2017          163   01/25/17 167 lb 6.4 oz (75.9 kg)  12/19/16 165 lb (74.8 kg)  08/02/16 167 lb 2 oz (75.8 kg)    Vital signs reviewed  02/26/2020  - Note at rest 02 sats  94% on RA     HEENT : pt wearing mask not removed for exam due to covid - 19 concerns.    NECK :  without JVD/Nodes/TM/ nl carotid upstrokes bilaterally   LUNGS: no acc muscle use,  Mild barrel  contour chest wall with bilateral  Distant bs s audible wheeze and  without cough on insp or exp maneuvers  and mild  Hyperresonant  to  percussion bilaterally     CV:  RRR  no s3 or murmur or increase in P2, and no edema   ABD:  soft and nontender with pos end  insp Hoover's  in the supine position. No bruits or organomegaly appreciated, bowel sounds nl  MS:   Nl gait/  ext warm without deformities, calf tenderness, cyanosis or clubbing No obvious joint restrictions   SKIN: warm and dry without lesions    NEURO:  alert, approp, nl sensorium with  no motor or cerebellar deficits apparent.        Labs ordered 02/26/2020  :    alpha one AT phenotype           Assessment:

## 2020-03-10 ENCOUNTER — Telehealth: Payer: Self-pay | Admitting: Internal Medicine

## 2020-03-10 DIAGNOSIS — J449 Chronic obstructive pulmonary disease, unspecified: Secondary | ICD-10-CM

## 2020-03-11 LAB — QUANTIFERON-TB GOLD PLUS
Mitogen-NIL: 7.88 IU/mL
NIL: 0.01 IU/mL
QuantiFERON-TB Gold Plus: NEGATIVE
TB1-NIL: 0 IU/mL
TB2-NIL: 0.01 IU/mL

## 2020-03-11 LAB — ALPHA-1 ANTITRYPSIN PHENOTYPE: A-1 Antitrypsin, Ser: 148 mg/dL (ref 83–199)

## 2020-03-11 NOTE — Telephone Encounter (Signed)
Spoke with the pt's spouse and notified of response per Dr Melvyn Novas  She verbalized understanding  States pt will come first thing tomorrow to have this drawn

## 2020-03-11 NOTE — Telephone Encounter (Signed)
Call patient : Studies are unremarkable, no change in recs  -------------  Spoke with pt, aware of recs.  Pt wants to know why covid antibodies were not tested in his labwork.  Per note it was discussed that an IGG could be drawn to test this, but this was never ordered.   MW please advise- do you want pt to come in to have these redrawn?  Thanks!

## 2020-03-11 NOTE — Telephone Encounter (Signed)
Apologize to him but the test I want him to have is the best test and needs to be done by about 9am it's sent in the morning batch  - I can order it now if he prefers or he can certainly talk this over with his PCP along with all the other health maintenance issues (his other vaccinations/ preventative measures)  I'll put in in computer in case he wants to do it here

## 2020-03-11 NOTE — Telephone Encounter (Signed)
Attempted to call pt but unable to reach. Unable to leave VM due to mailbox being full. Will try to call back later. 

## 2020-03-19 DIAGNOSIS — M546 Pain in thoracic spine: Secondary | ICD-10-CM | POA: Diagnosis not present

## 2020-03-19 DIAGNOSIS — F17210 Nicotine dependence, cigarettes, uncomplicated: Secondary | ICD-10-CM | POA: Diagnosis not present

## 2020-03-19 DIAGNOSIS — M542 Cervicalgia: Secondary | ICD-10-CM | POA: Diagnosis not present

## 2020-03-19 DIAGNOSIS — G8929 Other chronic pain: Secondary | ICD-10-CM | POA: Diagnosis not present

## 2020-03-19 DIAGNOSIS — Z79899 Other long term (current) drug therapy: Secondary | ICD-10-CM | POA: Diagnosis not present

## 2020-03-24 ENCOUNTER — Telehealth: Payer: Self-pay | Admitting: Internal Medicine

## 2020-03-24 DIAGNOSIS — F17210 Nicotine dependence, cigarettes, uncomplicated: Secondary | ICD-10-CM

## 2020-03-24 DIAGNOSIS — Z87891 Personal history of nicotine dependence: Secondary | ICD-10-CM

## 2020-03-24 NOTE — Telephone Encounter (Signed)
Spoke with pt and scheduled SDMV 05/03/20 2:00 CT ordered Nothing further needed

## 2020-03-31 ENCOUNTER — Telehealth: Payer: Self-pay | Admitting: Internal Medicine

## 2020-04-01 MED ORDER — STIOLTO RESPIMAT 2.5-2.5 MCG/ACT IN AERS: 2.0000 | INHALATION_SPRAY | Freq: Every day | RESPIRATORY_TRACT | 0 refills | Status: DC

## 2020-04-01 NOTE — Telephone Encounter (Signed)
Called spoke with patient. States he is in the donut hole with his Stiolto. He will be out of it Jan2022. I let him know we had a 2 week sample for him and he said that would help. I placed sample up front for pick up. Verified with patient that otherwise it was affordable and it is working nothing further needed.

## 2020-04-14 DIAGNOSIS — F17210 Nicotine dependence, cigarettes, uncomplicated: Secondary | ICD-10-CM | POA: Diagnosis not present

## 2020-04-14 DIAGNOSIS — M5416 Radiculopathy, lumbar region: Secondary | ICD-10-CM | POA: Diagnosis not present

## 2020-04-14 DIAGNOSIS — Z79899 Other long term (current) drug therapy: Secondary | ICD-10-CM | POA: Diagnosis not present

## 2020-04-14 DIAGNOSIS — M546 Pain in thoracic spine: Secondary | ICD-10-CM | POA: Diagnosis not present

## 2020-04-14 DIAGNOSIS — M62830 Muscle spasm of back: Secondary | ICD-10-CM | POA: Diagnosis not present

## 2020-04-14 DIAGNOSIS — R030 Elevated blood-pressure reading, without diagnosis of hypertension: Secondary | ICD-10-CM | POA: Diagnosis not present

## 2020-04-14 DIAGNOSIS — M542 Cervicalgia: Secondary | ICD-10-CM | POA: Diagnosis not present

## 2020-05-03 ENCOUNTER — Other Ambulatory Visit: Payer: Self-pay

## 2020-05-03 ENCOUNTER — Encounter: Payer: Self-pay | Admitting: Acute Care

## 2020-05-03 ENCOUNTER — Ambulatory Visit (INDEPENDENT_AMBULATORY_CARE_PROVIDER_SITE_OTHER)
Admission: RE | Admit: 2020-05-03 | Discharge: 2020-05-03 | Disposition: A | Discharge status: Home / Self Care | Payer: Medicare HMO | Source: Ambulatory Visit

## 2020-05-03 ENCOUNTER — Ambulatory Visit: Payer: Medicare HMO | Admitting: Acute Care

## 2020-05-03 VITALS — BP 124/82 | HR 68 | Temp 97.6°F | Ht 68.0 in | Wt 147.4 lb

## 2020-05-03 DIAGNOSIS — F17210 Nicotine dependence, cigarettes, uncomplicated: Secondary | ICD-10-CM | POA: Diagnosis not present

## 2020-05-03 DIAGNOSIS — Z122 Encounter for screening for malignant neoplasm of respiratory organs: Secondary | ICD-10-CM

## 2020-05-03 DIAGNOSIS — Z87891 Personal history of nicotine dependence: Secondary | ICD-10-CM | POA: Diagnosis not present

## 2020-05-03 NOTE — Patient Instructions (Signed)
Thank you for participating in the [place] [facility]. It was our pleasure to meet you today. We will call you with the results of your scan within the next few days. Your scan will be assigned a Lung RADS category score by the physicians reading the scans.  This Lung RADS score determines follow up scanning.  See below for description of categories, and follow up screening recommendations. We will be in touch to schedule your follow up screening annually or based on recommendations of our providers. We will fax a copy of your scan results to your Primary Care Physician, or the physician who referred you to the program, to ensure they have the results. Please call the office if you have any questions or concerns regarding your scanning experience or results.  Our office number is [phone]. Please speak with Denise Phelps, RN. She is our Lung Cancer Screening RN. If she is unavailable when you call, please have the office staff send her a message. She will return your call at her earliest convenience. Remember, if your scan is normal, we will scan you annually as long as you continue to meet the criteria for the program. (Age 55-77, Current smoker or smoker who has quit within the last 15 years). If you are a smoker, remember, quitting is the single most powerful action that you can take to decrease your risk of lung cancer and other pulmonary, breathing related problems. We know quitting is hard, and we are here to help.  Please let us know if there is anything we can do to help you meet your goal of quitting. If you are a former smoker, congratulations. We are proud of you! Remain smoke free! Remember you can refer friends or family members through the number above.  We will screen them to make sure they meet criteria for the program. Thank you for helping us take better care of you by participating in Lung Screening.  Lung RADS Categories:  Lung RADS 1: no nodules  or definitely non-concerning nodules.  Recommendation is for a repeat annual scan in 12 months.  Lung RADS 2:  nodules that are non-concerning in appearance and behavior with a very low likelihood of becoming an active cancer. Recommendation is for a repeat annual scan in 12 months.  Lung RADS 3: nodules that are probably non-concerning , includes nodules with a low likelihood of becoming an active cancer.  Recommendation is for a 6-month repeat screening scan. Often noted after an upper respiratory illness. We will be in touch to make sure you have no questions, and to schedule your 6-month scan.  Lung RADS 4 A: nodules with concerning findings, recommendation is most often for a follow up scan in 3 months or additional testing based on our provider's assessment of the scan. We will be in touch to make sure you have no questions and to schedule the recommended 3 month follow up scan.  Lung RADS 4 B:  indicates findings that are concerning. We will be in touch with you to schedule additional diagnostic testing based on our provider's  assessment of the scan.   

## 2020-05-03 NOTE — Progress Notes (Signed)
Shared Decision Making Visit Lung Cancer Screening Program [phone])   Eligibility:  Age 60 y.o.  Pack Years Smoking History Calculation 67 pack year smoking history (# packs/per year x # years smoked)  Recent History of coughing up blood  no  Unexplained weight loss? no ( >Than 15 pounds within the last 6 months )  Prior History Lung / other cancer no (Diagnosis within the last 5 years already requiring surveillance chest CT Scans).  Smoking Status Current Smoker  Former Smokers: Years since quit: NA  Quit Date: NA  Visit Components:  Discussion included one or more decision making aids. yes  Discussion included risk/benefits of screening. yes  Discussion included potential follow up diagnostic testing for abnormal scans. yes  Discussion included meaning and risk of over diagnosis. yes  Discussion included meaning and risk of False Positives. yes  Discussion included meaning of total radiation exposure. yes  Counseling Included:  Importance of adherence to annual lung cancer LDCT screening. yes  Impact of comorbidities on ability to participate in the program. yes  Ability and willingness to under diagnostic treatment. yes  Smoking Cessation Counseling:  Current Smokers:   Discussed importance of smoking cessation. yes  Information about tobacco cessation classes and interventions provided to patient. yes  Patient provided with "ticket" for LDCT Scan. yes  Symptomatic Patient. no  CounselingNA  Diagnosis Code: Tobacco Use Z72.0  Asymptomatic Patient yes  Counseling (Intermediate counseling: > three minutes counseling) Z5638  Former Smokers:   Discussed the importance of maintaining cigarette abstinence. yes  Diagnosis Code: Personal History of Nicotine Dependence. V56.433  Information about tobacco cessation classes and interventions provided to patient. Yes  Patient provided with "ticket" for LDCT Scan. yes  Written Order for Lung Cancer  Screening with LDCT placed in Epic. Yes (CT Chest Lung Cancer Screening Low Dose W/O CM) [id number] Z12.2-Screening of respiratory organs Z87.891-Personal history of nicotine dependence  I have spent 25 minutes of face to face time with Mr. Osorto discussing the risks and benefits of lung cancer screening. We viewed a power point together that explained in detail the above noted topics. We paused at intervals to allow for questions to be asked and answered to ensure understanding.We discussed that the single most powerful action that he can take to decrease his risk of developing lung cancer is to quit smoking. We discussed whether or not he is ready to commit to setting a quit date. We discussed options for tools to aid in quitting smoking including nicotine replacement therapy, non-nicotine medications, support groups, Quit Smart classes, and behavior modification. We discussed that often times setting smaller, more achievable goals, such as eliminating 1 cigarette a day for a week and then 2 cigarettes a day for a week can be helpful in slowly decreasing the number of cigarettes smoked. This allows for a sense of accomplishment as well as providing a clinical benefit. I gave him the " Be Stronger Than Your Excuses" card with contact information for community resources, classes, free nicotine replacement therapy, and access to mobile apps, text messaging, and on-line smoking cessation help. I have also given him my card and contact information in the event he needs to contact me. We discussed the time and location of the scan, and that either Abigail Miyamoto RN or I will call with the results within 24-48 hours of receiving them. I have offered him  a copy of the power point we viewed  as a resource in the event they need reinforcement of  the concepts we discussed today in the office. The patient verbalized understanding of all of  the above and had no further questions upon leaving the office. They have my  contact information in the event they have any further questions.  I spent 4 minutes counseling on smoking cessation and the health risks of continued tobacco abuse.  I explained to the patient that there has been a high incidence of coronary artery disease noted on these exams. I explained that this is a non-gated exam therefore degree or severity cannot be determined. This patient is currently on statin therapy. I have asked the patient to follow-up with their PCP regarding any incidental finding of coronary artery disease and management with diet or medication as their PCP  feels is clinically indicated. The patient verbalized understanding of the above and had no further questions upon completion of the visit.      Magdalen Spatz, NP 05/03/2020

## 2020-05-06 ENCOUNTER — Other Ambulatory Visit: Payer: Self-pay | Admitting: *Deleted

## 2020-05-06 ENCOUNTER — Encounter: Payer: Self-pay | Admitting: Internal Medicine

## 2020-05-06 DIAGNOSIS — I700 Atherosclerosis of aorta: Secondary | ICD-10-CM | POA: Insufficient documentation

## 2020-05-06 DIAGNOSIS — Z87891 Personal history of nicotine dependence: Secondary | ICD-10-CM

## 2020-05-06 DIAGNOSIS — F17210 Nicotine dependence, cigarettes, uncomplicated: Secondary | ICD-10-CM

## 2020-05-06 NOTE — Progress Notes (Signed)
Please call patient and let them  know their  low dose Ct was read as a Lung RADS 1, negative study: no nodules or definitely benign nodules. Radiology recommendation is for a repeat LDCT in 12 months. .Please let them  know we will order and schedule their  annual screening scan for 05/2021. Please let them  know there was notation of CAD on their  scan.  Please remind the patient  that this is a non-gated exam therefore degree or severity of disease  cannot be determined. Please have them  follow up with their PCP regarding potential risk factor modification, dietary therapy or pharmacologic therapy if clinically indicated. Pt.  is  currently on statin therapy. Please place order for annual  screening scan for  05/2021 and fax results to PCP. Thanks so much. 

## 2020-05-14 DIAGNOSIS — M546 Pain in thoracic spine: Secondary | ICD-10-CM | POA: Diagnosis not present

## 2020-05-14 DIAGNOSIS — Z6822 Body mass index (BMI) 22.0-22.9, adult: Secondary | ICD-10-CM | POA: Diagnosis not present

## 2020-05-14 DIAGNOSIS — F17210 Nicotine dependence, cigarettes, uncomplicated: Secondary | ICD-10-CM | POA: Diagnosis not present

## 2020-05-14 DIAGNOSIS — F17200 Nicotine dependence, unspecified, uncomplicated: Secondary | ICD-10-CM | POA: Diagnosis not present

## 2020-05-14 DIAGNOSIS — M5416 Radiculopathy, lumbar region: Secondary | ICD-10-CM | POA: Diagnosis not present

## 2020-05-14 DIAGNOSIS — Z79899 Other long term (current) drug therapy: Secondary | ICD-10-CM | POA: Diagnosis not present

## 2020-05-14 DIAGNOSIS — M62830 Muscle spasm of back: Secondary | ICD-10-CM | POA: Diagnosis not present

## 2020-05-14 DIAGNOSIS — M542 Cervicalgia: Secondary | ICD-10-CM | POA: Diagnosis not present

## 2020-06-11 ENCOUNTER — Other Ambulatory Visit: Payer: Self-pay

## 2020-06-11 MED ORDER — STIOLTO RESPIMAT 2.5-2.5 MCG/ACT IN AERS
2.0000 | INHALATION_SPRAY | Freq: Every day | RESPIRATORY_TRACT | 11 refills | Status: DC
Start: 2020-06-11 — End: 2021-06-28

## 2020-06-17 DIAGNOSIS — M542 Cervicalgia: Secondary | ICD-10-CM | POA: Diagnosis not present

## 2020-06-17 DIAGNOSIS — F17200 Nicotine dependence, unspecified, uncomplicated: Secondary | ICD-10-CM | POA: Diagnosis not present

## 2020-06-17 DIAGNOSIS — M5416 Radiculopathy, lumbar region: Secondary | ICD-10-CM | POA: Diagnosis not present

## 2020-06-17 DIAGNOSIS — Z6822 Body mass index (BMI) 22.0-22.9, adult: Secondary | ICD-10-CM | POA: Diagnosis not present

## 2020-06-17 DIAGNOSIS — F17210 Nicotine dependence, cigarettes, uncomplicated: Secondary | ICD-10-CM | POA: Diagnosis not present

## 2020-06-17 DIAGNOSIS — M546 Pain in thoracic spine: Secondary | ICD-10-CM | POA: Diagnosis not present

## 2020-06-17 DIAGNOSIS — M62830 Muscle spasm of back: Secondary | ICD-10-CM | POA: Diagnosis not present

## 2020-06-17 DIAGNOSIS — Z79899 Other long term (current) drug therapy: Secondary | ICD-10-CM | POA: Diagnosis not present

## 2020-07-16 DIAGNOSIS — M542 Cervicalgia: Secondary | ICD-10-CM | POA: Diagnosis not present

## 2020-07-16 DIAGNOSIS — M5416 Radiculopathy, lumbar region: Secondary | ICD-10-CM | POA: Diagnosis not present

## 2020-07-16 DIAGNOSIS — Z79899 Other long term (current) drug therapy: Secondary | ICD-10-CM | POA: Diagnosis not present

## 2020-07-16 DIAGNOSIS — F17200 Nicotine dependence, unspecified, uncomplicated: Secondary | ICD-10-CM | POA: Diagnosis not present

## 2020-07-16 DIAGNOSIS — Z6822 Body mass index (BMI) 22.0-22.9, adult: Secondary | ICD-10-CM | POA: Diagnosis not present

## 2020-07-16 DIAGNOSIS — M62830 Muscle spasm of back: Secondary | ICD-10-CM | POA: Diagnosis not present

## 2020-07-16 DIAGNOSIS — M546 Pain in thoracic spine: Secondary | ICD-10-CM | POA: Diagnosis not present

## 2020-07-16 DIAGNOSIS — F17210 Nicotine dependence, cigarettes, uncomplicated: Secondary | ICD-10-CM | POA: Diagnosis not present

## 2020-07-20 DIAGNOSIS — H524 Presbyopia: Secondary | ICD-10-CM | POA: Diagnosis not present

## 2020-07-20 DIAGNOSIS — Z0100 Encounter for examination of eyes and vision without abnormal findings: Secondary | ICD-10-CM | POA: Diagnosis not present

## 2020-08-24 ENCOUNTER — Encounter: Payer: Self-pay | Admitting: Internal Medicine

## 2020-08-24 ENCOUNTER — Other Ambulatory Visit: Payer: Self-pay

## 2020-08-24 ENCOUNTER — Ambulatory Visit (INDEPENDENT_AMBULATORY_CARE_PROVIDER_SITE_OTHER): Payer: Medicare HMO | Admitting: Internal Medicine

## 2020-08-24 VITALS — BP 200/106 | HR 86 | Temp 98.6°F | Ht 68.0 in | Wt 152.0 lb

## 2020-08-24 DIAGNOSIS — E559 Vitamin D deficiency, unspecified: Secondary | ICD-10-CM

## 2020-08-24 DIAGNOSIS — I10 Essential (primary) hypertension: Secondary | ICD-10-CM | POA: Diagnosis not present

## 2020-08-24 DIAGNOSIS — E7800 Pure hypercholesterolemia, unspecified: Secondary | ICD-10-CM | POA: Diagnosis not present

## 2020-08-24 DIAGNOSIS — I700 Atherosclerosis of aorta: Secondary | ICD-10-CM

## 2020-08-24 DIAGNOSIS — Z0001 Encounter for general adult medical examination with abnormal findings: Secondary | ICD-10-CM | POA: Diagnosis not present

## 2020-08-24 DIAGNOSIS — J449 Chronic obstructive pulmonary disease, unspecified: Secondary | ICD-10-CM | POA: Diagnosis not present

## 2020-08-24 MED ORDER — AMLODIPINE BESYLATE 10 MG PO TABS: 10.0000 mg | ORAL_TABLET | Freq: Every day | ORAL | 3 refills | Status: DC

## 2020-08-24 MED ORDER — ATORVASTATIN CALCIUM 10 MG PO TABS: 10.0000 mg | ORAL_TABLET | Freq: Every day | ORAL | 3 refills | Status: DC

## 2020-08-24 NOTE — Patient Instructions (Addendum)
Please fax recent results of lab testing from [facility] to [phone]  Please take all new medication as prescribed - the amlodipine 10 mg per day, and call in 3 wks with your average BP  Please continue all other medications as before, including to start the lipitor 10 mg  You will be contacted regarding the referral for: renal artery ultrasound testing  Please have the pharmacy call with any other refills you may need.  Please continue your efforts at being more active, low cholesterol diet, and weight control.  You are otherwise up to date with prevention measures today.  Please keep your appointments with your specialists as you may have planned  Please make an Appointment to return in 6 months, or sooner if needed

## 2020-08-24 NOTE — Progress Notes (Signed)
Patient ID: Cody Grant, male   DOB: 03/09/1961, 60 y.o.   MRN: [id number]         Chief Complaint:: wellness exam and Follow-up  low vit d, HL, HTN, recent tick bite, and smoking       HPI:  Cody Grant is a 60 y.o. male here for wellness exam; due for tdap and cologuard but declines for now      Also BP has been consistently elevated usually more in the 160 sbp range.  Has not started lipitor for hld but now willing.  Did have a recent tick bite s/p doxy course per his pain management and overall now doing well.  Trying to quit smoking down to 1/4 ppd but just cant seem to quit completely, not ready.  Pt denies chest pain, increased sob or doe, wheezing, orthopnea, PND, increased LE swelling, palpitations, dizziness or syncope.   Pt denies polydipsia, polyuria, or new focal neuro s/s.   Pt denies fever, wt loss, night sweats, loss of appetite, or other constitutional symptoms  Denies worsening depressive symptoms, suicidal ideation, or panic.  Declines labs today as has had recent "full panel" done with [facility].     Wt Readings from Last 3 Encounters:  08/24/20 152 lb (68.9 kg)  05/03/20 147 lb 6.4 oz (66.9 kg)  02/26/20 144 lb 6.4 oz (65.5 kg)   BP Readings from Last 3 Encounters:  08/24/20 (!) 200/106  05/03/20 124/82  02/26/20 (!) 154/84   There is no immunization history on file for this patient. There are no preventive care reminders to display for this patient.    Past Medical History:  Diagnosis Date  . Allergic rhinitis, cause unspecified   . Asthma    as child  . Chronic lumbar radiculopathy    left with left drop foot/numbness  . Chronic pain    to see Dr Delynn Flavin  . 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 Factor 5 Leiden mutation, heterozygous ([place])   . HTN (hypertension) 08/25/2019  . Left foot drop   . Lumbar disc disease   . Other and unspecified hyperlipidemia 05/20/2013   Past Surgical History:  Procedure Laterality Date  . SPINE SURGERY  12/2010   micro  diskectomy    reports that he has been smoking cigarettes. He has a 30.00 pack-year smoking history. He has never used smokeless tobacco. He reports current alcohol use of about 2.0 - 4.0 standard drinks of alcohol per week. He reports that he does not use drugs. family history includes COPD in his mother; Diabetes in his mother; Pulmonary embolism in his father. No Known Allergies Current Outpatient Medications on File Prior to Visit  Medication Sig Dispense Refill  . albuterol (ACCUNEB) 0.63 MG/3ML nebulizer solution Take 3 mLs (0.63 mg total) by nebulization every 6 (six) hours as needed for wheezing. 75 mL 12  . albuterol (VENTOLIN HFA) 108 (90 Base) MCG/ACT inhaler Inhale 2 puffs into the lungs every 6 (six) hours as needed for wheezing or shortness of breath. 54 g 3  . ibuprofen (ADVIL,MOTRIN) 400 MG tablet Take 1 tablet (400 mg total) by mouth every 8 (eight) hours as needed for pain. 90 tablet 1  . NAPROXEN PO Take 1 tablet by mouth as directed.    Marland Kitchen NARCAN 4 MG/0.1ML LIQD nasal spray kit     . oxyCODONE (OXY IR/ROXICODONE) 5 MG immediate release tablet Take 1 tablet (5 mg total) by mouth every 8 (eight) hours as needed. May take an extra tablet  for severe pain occasionally 100 tablet 0  . sildenafil (VIAGRA) 100 MG tablet Take 0.5-1 tablets (50-100 mg total) by mouth daily as needed for erectile dysfunction. 5 tablet 11  . Tiotropium Bromide-Olodaterol (STIOLTO RESPIMAT) 2.5-2.5 MCG/ACT AERS Inhale 2 puffs into the lungs daily. 4 g 11  . tiZANidine (ZANAFLEX) 4 MG tablet TAKE 1 TABLET(4 MG) BY MOUTH EVERY 8 HOURS AS NEEDED FOR MUSCLE SPASMS 90 tablet 0  . Vitamin D, Ergocalciferol, (DRISDOL) 1.25 MG (50000 UNIT) CAPS capsule     . traZODone (DESYREL) 50 MG tablet      No current facility-administered medications on file prior to visit.        ROS:  All others reviewed and negative.  Objective        PE:  BP (!) 200/106 (BP Location: Left Arm, Patient Position: Sitting, Cuff Size:  Normal)   Pulse 86   Temp 98.6 F (37 C) (Oral)   Ht $R'5\' 8"'hD$  (1.727 m)   Wt 152 lb (68.9 kg)   SpO2 97%   BMI 23.11 kg/m                 Constitutional: Pt appears in NAD               HENT: Head: NCAT.                Right Ear: External ear normal.                 Left Ear: External ear normal.                Eyes: . Pupils are equal, round, and reactive to light. Conjunctivae and EOM are normal               Nose: without d/c or deformity               Neck: Neck supple. Gross normal ROM               Cardiovascular: Normal rate and regular rhythm.                 Pulmonary/Chest: Effort normal and breath sounds without rales or wheezing.                Abd:  Soft, NT, ND, + BS, no organomegaly               Neurological: Pt is alert. At baseline orientation, motor grossly intact               Skin: Skin is warm. No rashes, no other new lesions, LE edema - none               Psychiatric: Pt behavior is normal without agitation   Micro: none  Cardiac tracings I have personally interpreted today:  none  Pertinent Radiological findings (summarize): none   Lab Results  Component Value Date   WBC 8.9 02/24/2019   HGB 14.5 02/24/2019   HCT 42.7 02/24/2019   PLT 248.0 02/24/2019   GLUCOSE 85 02/24/2019   CHOL 213 (H) 02/24/2019   TRIG 205.0 (H) 02/24/2019   HDL 50.60 02/24/2019   LDLDIRECT 137.0 02/24/2019   LDLCALC 105 (H) 12/19/2017   ALT 15 02/24/2019   AST 18 02/24/2019   NA 138 02/24/2019   K 4.2 02/24/2019   CL 103 02/24/2019   CREATININE 1.02 02/24/2019   BUN 12 02/24/2019   CO2 29 02/24/2019   TSH 1.41  02/24/2019   PSA 1.08 02/24/2019   Assessment/Plan:  Cody Grant is a 60 y.o. Unavailable [8] male with  has a past medical history of Allergic rhinitis, cause unspecified, Asthma, Chronic lumbar radiculopathy, Chronic pain, COPD, severe ([place]) (06/22/2013), Factor 5 Leiden mutation, heterozygous ([place]), HTN (hypertension) (08/25/2019), Left foot drop, Lumbar disc  disease, and Other and unspecified hyperlipidemia (05/20/2013).  Encounter for well adult exam with abnormal findings Age and sex appropriate education and counseling updated with regular exercise and diet Referrals for preventative services - declines cologuard Immunizations addressed - declines tdap Smoking counseling  - counseled to quit, not yet ready Evidence for depression or other mood disorder - none significant Most recent labs reviewed. I have personally reviewed and have noted: 1) the patient's medical and social history 2) The patient's current medications and supplements 3) The patient's height, weight, and BMI have been recorded in the chart   COPD GOLD  III  Stable, to continue current tx - albuterol hfa prn   Aortic atherosclerosis (HCC) Pt now ok to start lipitor, cont diet and excercise  Vitamin D deficiency By report, pt to start low vit d 2000 u qd  Hypertension, uncontrolled BP Readings from Last 3 Encounters:  08/24/20 (!) 200/106  05/03/20 124/82  02/26/20 (!) 154/84   Severe unontrolled, to start amlodipine 10 qd, check renal art u/s, call with BP in 2-3 wks   Current Outpatient Medications (Cardiovascular):  .  amLODipine (NORVASC) 10 MG tablet, Take 1 tablet (10 mg total) by mouth daily. .  sildenafil (VIAGRA) 100 MG tablet, Take 0.5-1 tablets (50-100 mg total) by mouth daily as needed for erectile dysfunction. Marland Kitchen  atorvastatin (LIPITOR) 10 MG tablet, Take 1 tablet (10 mg total) by mouth daily.  Current Outpatient Medications (Respiratory):  .  albuterol (ACCUNEB) 0.63 MG/3ML nebulizer solution, Take 3 mLs (0.63 mg total) by nebulization every 6 (six) hours as needed for wheezing. Marland Kitchen  albuterol (VENTOLIN HFA) 108 (90 Base) MCG/ACT inhaler, Inhale 2 puffs into the lungs every 6 (six) hours as needed for wheezing or shortness of breath. .  Tiotropium Bromide-Olodaterol (STIOLTO RESPIMAT) 2.5-2.5 MCG/ACT AERS, Inhale 2 puffs into the lungs  daily.  Current Outpatient Medications (Analgesics):  .  ibuprofen (ADVIL,MOTRIN) 400 MG tablet, Take 1 tablet (400 mg total) by mouth every 8 (eight) hours as needed for pain. Marland Kitchen  NAPROXEN PO, Take 1 tablet by mouth as directed. Marland Kitchen  oxyCODONE (OXY IR/ROXICODONE) 5 MG immediate release tablet, Take 1 tablet (5 mg total) by mouth every 8 (eight) hours as needed. May take an extra tablet for severe pain occasionally   Current Outpatient Medications (Other):  Marland Kitchen  NARCAN 4 MG/0.1ML LIQD nasal spray kit,  .  tiZANidine (ZANAFLEX) 4 MG tablet, TAKE 1 TABLET(4 MG) BY MOUTH EVERY 8 HOURS AS NEEDED FOR MUSCLE SPASMS .  Vitamin D, Ergocalciferol, (DRISDOL) 1.25 MG (50000 UNIT) CAPS capsule,  .  traZODone (DESYREL) 50 MG tablet,    Hyperlipidemia Lab Results  Component Value Date   LDLCALC 105 (H) 12/19/2017   Stable, pt to start lipitor   Followup: Return in about 6 months (around 02/23/2021).  Cathlean Cower, MD 08/30/2020 8:26 PM [facility]

## 2020-08-30 ENCOUNTER — Encounter: Payer: Self-pay | Admitting: Internal Medicine

## 2020-08-30 NOTE — Assessment & Plan Note (Signed)
Stable, to continue current tx - albuterol hfa prn

## 2020-08-30 NOTE — Assessment & Plan Note (Signed)
By report, pt to start low vit d 2000 u qd

## 2020-08-30 NOTE — Assessment & Plan Note (Signed)
Pt now ok to start lipitor, cont diet and excercise

## 2020-08-30 NOTE — Assessment & Plan Note (Signed)
Age and sex appropriate education and counseling updated with regular exercise and diet Referrals for preventative services - declines cologuard Immunizations addressed - declines tdap Smoking counseling  - counseled to quit, not yet ready Evidence for depression or other mood disorder - none significant Most recent labs reviewed. I have personally reviewed and have noted: 1) the patient's medical and social history 2) The patient's current medications and supplements 3) The patient's height, weight, and BMI have been recorded in the chart

## 2020-08-30 NOTE — Assessment & Plan Note (Signed)
BP Readings from Last 3 Encounters:  08/24/20 (!) 200/106  05/03/20 124/82  02/26/20 (!) 154/84   Severe unontrolled, to start amlodipine 10 qd, check renal art u/s, call with BP in 2-3 wks   Current Outpatient Medications (Cardiovascular):  .  amLODipine (NORVASC) 10 MG tablet, Take 1 tablet (10 mg total) by mouth daily. .  sildenafil (VIAGRA) 100 MG tablet, Take 0.5-1 tablets (50-100 mg total) by mouth daily as needed for erectile dysfunction. Marland Kitchen  atorvastatin (LIPITOR) 10 MG tablet, Take 1 tablet (10 mg total) by mouth daily.  Current Outpatient Medications (Respiratory):  .  albuterol (ACCUNEB) 0.63 MG/3ML nebulizer solution, Take 3 mLs (0.63 mg total) by nebulization every 6 (six) hours as needed for wheezing. Marland Kitchen  albuterol (VENTOLIN HFA) 108 (90 Base) MCG/ACT inhaler, Inhale 2 puffs into the lungs every 6 (six) hours as needed for wheezing or shortness of breath. .  Tiotropium Bromide-Olodaterol (STIOLTO RESPIMAT) 2.5-2.5 MCG/ACT AERS, Inhale 2 puffs into the lungs daily.  Current Outpatient Medications (Analgesics):  .  ibuprofen (ADVIL,MOTRIN) 400 MG tablet, Take 1 tablet (400 mg total) by mouth every 8 (eight) hours as needed for pain. Marland Kitchen  NAPROXEN PO, Take 1 tablet by mouth as directed. Marland Kitchen  oxyCODONE (OXY IR/ROXICODONE) 5 MG immediate release tablet, Take 1 tablet (5 mg total) by mouth every 8 (eight) hours as needed. May take an extra tablet for severe pain occasionally   Current Outpatient Medications (Other):  Marland Kitchen  NARCAN 4 MG/0.1ML LIQD nasal spray kit,  .  tiZANidine (ZANAFLEX) 4 MG tablet, TAKE 1 TABLET(4 MG) BY MOUTH EVERY 8 HOURS AS NEEDED FOR MUSCLE SPASMS .  Vitamin D, Ergocalciferol, (DRISDOL) 1.25 MG (50000 UNIT) CAPS capsule,  .  traZODone (DESYREL) 50 MG tablet,

## 2020-08-30 NOTE — Assessment & Plan Note (Signed)
Lab Results  Component Value Date   LDLCALC 105 (H) 12/19/2017   Stable, pt to start lipitor

## 2020-09-03 ENCOUNTER — Ambulatory Visit (HOSPITAL_COMMUNITY)
Admission: RE | Admit: 2020-09-03 | Discharge: 2020-09-03 | Disposition: A | Discharge status: Home / Self Care | Payer: Medicare HMO | Source: Ambulatory Visit | Attending: Cardiology | Admitting: Cardiology

## 2020-09-03 ENCOUNTER — Other Ambulatory Visit: Payer: Self-pay

## 2020-09-03 DIAGNOSIS — I10 Essential (primary) hypertension: Secondary | ICD-10-CM | POA: Diagnosis present

## 2020-09-04 ENCOUNTER — Encounter: Payer: Self-pay | Admitting: Internal Medicine

## 2021-02-23 ENCOUNTER — Ambulatory Visit: Payer: Medicare HMO | Admitting: Internal Medicine

## 2021-02-25 ENCOUNTER — Encounter: Payer: Self-pay | Admitting: Internal Medicine

## 2021-02-25 ENCOUNTER — Ambulatory Visit: Payer: Medicare HMO | Admitting: Internal Medicine

## 2021-02-25 ENCOUNTER — Other Ambulatory Visit: Payer: Self-pay | Admitting: Internal Medicine

## 2021-02-25 ENCOUNTER — Other Ambulatory Visit: Payer: Self-pay

## 2021-02-25 DIAGNOSIS — J449 Chronic obstructive pulmonary disease, unspecified: Secondary | ICD-10-CM | POA: Diagnosis not present

## 2021-02-25 DIAGNOSIS — F17210 Nicotine dependence, cigarettes, uncomplicated: Secondary | ICD-10-CM

## 2021-02-25 MED ORDER — PREDNISONE 10 MG PO TABS: ORAL_TABLET | ORAL | 11 refills | Status: DC

## 2021-02-25 MED ORDER — STIOLTO RESPIMAT 2.5-2.5 MCG/ACT IN AERS: 2.0000 | INHALATION_SPRAY | Freq: Every day | RESPIRATORY_TRACT | 0 refills | Status: DC

## 2021-02-25 NOTE — Assessment & Plan Note (Signed)
Counseled re importance of smoking cessation but did not meet time criteria for separate billing           Each maintenance medication was reviewed in detail including emphasizing most importantly the difference between maintenance and prns and under what circumstances the prns are to be triggered using an action plan format where appropriate.  Total time for H and P, chart review, counseling, reviewing respimat/hfa/neb  device(s) and generating customized AVS unique to this office visit / same day charting =  34 min

## 2021-02-25 NOTE — Progress Notes (Signed)
Subjective:     Patient ID: Cody Grant, male   DOB: May 06, 1960     MRN: [id number]    Brief patient profile:  89  yowm from [facility]  active smoker with GOLD II copd / MS phenotype with large reversible component 2015 referred to pulmonary clinic 01/25/2017 by Dr   Jenny Reichmann    History of Present Illness  01/25/2017 1st [facility] office visit/ Cody Grant   Chief Complaint  Patient presents with   Pulmonary Consult    Referred by Dr. Cathlean Cower. Pt dxed with COPD in 2016. He states he has trouble with his breathing with hot/humid weather. He tends to have trouble in the am's, and has a non prod cough. He states he went a period of time without his spiriva due to cost and breathing got worse, but has improved since he started this back recently. He uses proair and also albuterol neb both 1 x per wk on average.   limited more by L radicular back  pain than   sob maint on spiriva dpi which he assoc with upper airway pattern coughing  Recurrent acute cough flares 2 x year then needs neb more then but usually not needing  Much saba unless he overdoes it out in the heat   rec Plan A = Automatic = Stiolto 2 pffs each am  Work on inhaler technique:  relax and gently blow all the way out then take a nice smooth deep breath back in, triggering the inhaler at same time you start breathing in.  Hold for up to 5 seconds if you can. Blow out thru nose. Rinse and gargle with water when done Plan B = Backup Only use your albuterol as a rescue medication Plan C = Crisis - only use your albuterol nebulizer if you first try Plan B and it fails to help > ok to use the nebulizer up to every 4 hours but if start needing it regularly call for immediate appointment     03/09/2017  f/u ov/Cody Grant re:  Copd GOLD III // still smoking 7 cigs per day / not using stiolto consistently  Chief Complaint  Patient presents with   Follow-up    PFT's done today.  He states that he has good days and bad days. He is using his  proair 4 x per wk on average.    lot of cough/gagging worse at hs but usually sleeps/wakes ok  then after coffee starts back up  [facility] = can't walk a nl pace on a flat grade s sob but does fine slow and flat eg walking flat/slow  Only started stiolto a few days prior to OV  Since wanted to use up his spiriva supply first  rec Plan A = Automatic = Stiolto 2 pffs each am  Plan B = Backup Only use your albuterol as a rescue medication The key is to stop smoking completely before smoking completely stops you!      09/06/2017  f/u ov/Cody Grant re:  GOLD III/ still smoking  Chief Complaint  Patient presents with   Follow-up    Breathing had been worse here recently- relates to the pollen. He states that this happens every Spring. He rarely uses his albuterol inhaler but he has been using neb about 2 x per day.   Dyspnea:  Still = MMRC2 = can't walk a nl pace on a flat grade s sob but does fine slow and flat  Cough: mostly dry  Sleep: sleep ok once gets  to  SABA use:  Was using a lot with"pollen" including nebs, not now  Wake ups to coffee and cig daily then starts coughing mostly dry  Also cough immediately  at hs  But not waking him up noct or in am  rec Plan A = Automatic = Stiolto 2 pffs each am  Plan B = Backup Only use your albuterol (proair) as a rescue medication Plan C = Crisis - only use your albuterol nebulizer if you first try Plan B and it fails to help > ok to use the nebulizer up to every 4 hours but if start needing it regularly call for immediate appointment Plan D = Doctor When you start noticing more symptoms or need for albuterol :  Prednisone 10 mg take  4 each am x 2 days,   2 each am x 2 days,  1 each am x 2 days and stop    03/11/2018  f/u ov/Cody Grant re:  COPD III/ still smoking  Chief Complaint  Patient presents with   Follow-up    Cough since "change in the weather"- non prod and tends to be worse at night. He rarely uses his albuterol inhaler or neb.   Dyspnea:   MMRC2 = can't walk a nl pace on a flat grade s sob but does fine slow and flat  Cough: hs and then in am almost goes into fit gagging x 20 min  Then better p coughing up thick white mucus   Sleeping: flat/ on side / one pillow  SABA use: minimum 02: no  rec  Plan A = Automatic = Stiolto 2 pffs each am  (symbicort 160 x 2 every 12 hours plus Spiriva 1.25 2 puffs every 12 hours until you use it up)   Plan B = Backup Only use your albuterol (proair)  Plan C = Crisis - only use your albuterol nebulizer if you first try Plan B Plan D = Deltasone  When you start noticing more symptoms or need for albuterol :  Prednisone 10 mg take  4 each am x 2 days,   2 each am x 2 days,  1 each am x 2 days and stop Take  mucinex dm up to 600 mg x 1200 12 hours and supplement if needed with oxyir  up to 2 every 4 hours  The key is to stop smoking completely before smoking completely stops you!  Although I certainly  don't endorse regular use of e cigs   I emphasized they should be considered a quick "one-way bridge" off all tobacco products and that he only use the commercial brands in the very short term and stop immediately if any resp symptoms worsen     televist  07/18/19  rec We will refer you  to Eric Form NP for lung cancer screening I strongly recommend you get the covid 19 vaccine as soon as it is offered as it may save both your life and those of your close contacts Please schedule a follow up visit in 12  months but call sooner if needed    02/26/2020  f/u ov/Cody Grant re: copd gold III/ still smoking/ can't afford stiolto / ?  needs  Chief Complaint  Patient presents with   Follow-up    no complaints   Dyspnea:  MMRC2 = can't walk a nl pace on a flat grade s sob but does fine slow and flat  Cough: none  Sleeping: on side / one pillow SABA use: min recently as  long as has access to Stiolto  02: none  Rec Stiolto when can afford it Stop smoking Lung cancer screening >  05/03/2020 RADS 1/  emphysema      02/25/2021  f/u ov/Cody Grant re: GOLD 3 copd/ MS phenotype/ smoking still  maint on stiolto 2 daily   Chief Complaint  Patient presents with   Follow-up    Breathing is overall doing well. He has some cough and PND- cough is prod in the am with clear to yellow sputum.  He has not had to use his albuterol inhaler in the past few wks.    Dyspnea:  walks dog twice daily x 1.5h / some hills  Cough: none  Sleeping: on a flat bed / one pillow on side  SABA use: none  02: none  Covid status:  vax none / isolates    No obvious day to day or daytime variability or assoc excess/ purulent sputum or mucus plugs or hemoptysis or cp or chest tightness, subjective wheeze or overt sinus or hb symptoms.   Sleeping  without nocturnal  or early am exacerbation  of respiratory  c/o's or need for noct saba. Also denies any obvious fluctuation of symptoms with weather or environmental changes or other aggravating or alleviating factors except as outlined above   No unusual exposure hx or h/o childhood pna/ asthma or knowledge of premature birth.  Current Allergies, Complete Past Medical History, Past Surgical History, Family History, and Social History were reviewed in [employer] record.  ROS  The following are not active complaints unless bolded Hoarseness, sore throat, dysphagia, dental problems, itching, sneezing,  nasal congestion or discharge of excess mucus or purulent secretions, ear ache,   fever, chills, sweats, unintended wt loss or wt gain, classically pleuritic or exertional cp,  orthopnea pnd or arm/hand swelling  or leg swelling, presyncope, palpitations, abdominal pain, anorexia, nausea, vomiting, diarrhea  or change in bowel habits or change in bladder habits, change in stools or change in urine, dysuria, hematuria,  rash, arthralgias, visual complaints, headache, numbness, weakness or ataxia or problems with walking or coordination,  change in mood or   memory.        Current Meds  Medication Sig   albuterol (ACCUNEB) 0.63 MG/3ML nebulizer solution Take 3 mLs (0.63 mg total) by nebulization every 6 (six) hours as needed for wheezing.   albuterol (VENTOLIN HFA) 108 (90 Base) MCG/ACT inhaler Inhale 2 puffs into the lungs every 6 (six) hours as needed for wheezing or shortness of breath.   amLODipine (NORVASC) 10 MG tablet Take 1 tablet (10 mg total) by mouth daily.   ibuprofen (ADVIL,MOTRIN) 400 MG tablet Take 1 tablet (400 mg total) by mouth every 8 (eight) hours as needed for pain.   NAPROXEN PO Take 1 tablet by mouth as directed.   NARCAN 4 MG/0.1ML LIQD nasal spray kit    oxyCODONE (OXY IR/ROXICODONE) 5 MG immediate release tablet Take 1 tablet (5 mg total) by mouth every 8 (eight) hours as needed. May take an extra tablet for severe pain occasionally   sildenafil (VIAGRA) 100 MG tablet Take 0.5-1 tablets (50-100 mg total) by mouth daily as needed for erectile dysfunction.   Tiotropium Bromide-Olodaterol (STIOLTO RESPIMAT) 2.5-2.5 MCG/ACT AERS Inhale 2 puffs into the lungs daily.   tiZANidine (ZANAFLEX) 4 MG tablet TAKE 1 TABLET(4 MG) BY MOUTH EVERY 8 HOURS AS NEEDED FOR MUSCLE SPASMS   traZODone (DESYREL) 50 MG tablet    Vitamin D, Ergocalciferol, (DRISDOL)  1.25 MG (50000 UNIT) CAPS capsule                  Objective:   Physical Exam     02/25/2021       145 02/26/2020      144  04/10/2018        162  03/11/2018      161  09/06/2017          163   01/25/17 167 lb 6.4 oz (75.9 kg)  12/19/16 165 lb (74.8 kg)  08/02/16 167 lb 2 oz (75.8 kg)     Vital signs reviewed  02/25/2021  - Note at rest 02 sats  97% on RA   General appearance:    somber wm nad   HEENT : pt wearing mask not removed for exam due to covid - 19 concerns.    NECK :  without JVD/Nodes/TM/ nl carotid upstrokes bilaterally   LUNGS: no acc muscle use,  Mild barrel  contour chest wall with bilateral  Distant bs s audible wheeze and  without cough on insp  or exp maneuvers  and mild  Hyperresonant  to  percussion bilaterally     CV:  RRR  no s3 or murmur or increase in P2, and no edema   ABD:  soft and nontender with pos end  insp Hoover's  in the supine position. No bruits or organomegaly appreciated, bowel sounds nl  MS:   Nl gait/  ext warm without deformities, calf tenderness, cyanosis or clubbing No obvious joint restrictions   SKIN: warm and dry without lesions    NEURO:  alert, approp, nl sensorium with  no motor or cerebellar deficits apparent.        I personally reviewed images and agree with radiology impression as follows:  Chest LDSCT  05/03/20  Lung-RADS 1, negative. Continue annual screening with low-dose chest CT without contrast in 12 months    Assessment:

## 2021-02-25 NOTE — Patient Instructions (Addendum)
Plan A = Automatic = Always=    stioto 2 puff each am   Plan B = Backup (to supplement plan A, not to replace it) Only use your albuterol inhaler as a rescue medication to be used if you can't catch your breath by resting or doing a relaxed purse lip breathing pattern.  - The less you use it, the better it will work when you need it. - Ok to use the inhaler up to 2 puffs  every 4 hours if you must but call for appointment if use goes up over your usual need - Don't leave home without it !!  (think of it like the spare tire for your car)   Plan C = Crisis (instead of Plan B but only if Plan B stops working) - only use your albuterol nebulizer if you first try Plan B and it fails to help > ok to use the nebulizer up to every 4 hours but if start needing it regularly call for immediate appointment   Plan D = Delatasone = prednisone   - take 6 days when ABC not working great   We will get you a new nebulizer machine    Please schedule a follow up visit in 12 months but call sooner if needed

## 2021-02-25 NOTE — Assessment & Plan Note (Addendum)
Active smoker PFT's   05/20/2013   FEV1 1.95 (54 % ) ratio 59  p 39 % improvement from saba p ? prior to study with DLCO  62 % corrects to 85  % for alv volume   - 01/25/2017    try stiolto 2 each am  - PFT's  03/09/2017  FEV1 1.65 (48 % ) ratio 54  p 1 % improvement from saba p stiolto  prior to study with DLCO  56/57 % corrects to 74 % for alv volume   - 03/11/2018 added prn prednisone x 6 days for refractory symptoms - 03/11/2018  After extensive coaching inhaler device,  effectiveness =    90% with smi and hfa  - 04/10/2018   Walked RA  2 laps @ 26ft each @ avg pace  stopped due to end of study no desats -  [facility] AT screen 02/26/2020 >>>  MS   Level 148   Pt is Group B in terms of symptom/risk and laba/lama therefore appropriate rx at this point >>>  stiolto and if flares can use pred x 6 days and if more than 2 per year would change over to trelegy or breztri   Discussed MS phenotype in terms of his smoking and fm members

## 2021-03-04 MED ORDER — ALBUTEROL SULFATE 0.63 MG/3ML IN NEBU: 1.0000 | INHALATION_SOLUTION | Freq: Four times a day (QID) | RESPIRATORY_TRACT | 12 refills | Status: DC | PRN

## 2021-03-08 ENCOUNTER — Ambulatory Visit (INDEPENDENT_AMBULATORY_CARE_PROVIDER_SITE_OTHER): Payer: Medicare HMO | Admitting: Internal Medicine

## 2021-03-08 ENCOUNTER — Other Ambulatory Visit: Payer: Self-pay

## 2021-03-08 ENCOUNTER — Encounter: Payer: Self-pay | Admitting: Internal Medicine

## 2021-03-08 VITALS — BP 148/80 | HR 83 | Temp 98.1°F | Ht 68.0 in | Wt 143.0 lb

## 2021-03-08 DIAGNOSIS — I10 Essential (primary) hypertension: Secondary | ICD-10-CM

## 2021-03-08 DIAGNOSIS — Z0000 Encounter for general adult medical examination without abnormal findings: Secondary | ICD-10-CM

## 2021-03-08 DIAGNOSIS — E7800 Pure hypercholesterolemia, unspecified: Secondary | ICD-10-CM | POA: Diagnosis not present

## 2021-03-08 DIAGNOSIS — F17210 Nicotine dependence, cigarettes, uncomplicated: Secondary | ICD-10-CM

## 2021-03-08 DIAGNOSIS — G250 Essential tremor: Secondary | ICD-10-CM

## 2021-03-08 DIAGNOSIS — J449 Chronic obstructive pulmonary disease, unspecified: Secondary | ICD-10-CM

## 2021-03-08 DIAGNOSIS — E559 Vitamin D deficiency, unspecified: Secondary | ICD-10-CM

## 2021-03-08 NOTE — Assessment & Plan Note (Signed)
With recent mild worsening uncontrolled, but remains mild and pt declines Beta blocker for now, or neuro referral

## 2021-03-08 NOTE — Assessment & Plan Note (Signed)
Stable overall, cont albuterol hfa prn

## 2021-03-08 NOTE — Assessment & Plan Note (Signed)
BP Readings from Last 3 Encounters:  03/08/21 (!) 148/80  02/25/21 124/76  08/24/20 (!) 200/106   Uncontrolled here, suspect may be elsewhere as well, but pt states BP ok at home,, pt to continue medical treatment norvasc - declines change in tx

## 2021-03-08 NOTE — Progress Notes (Signed)
Patient ID: Cody Grant, male   DOB: [birth year], 60 y.o.   MRN: [id number]        Chief Complaint: follow up HTN, HLD, smoker, copd,  worsening head tremor       HPI:  Cody Grant is a 60 y.o. male here overall doing ok; Pt denies chest pain, increased sob or doe, wheezing, orthopnea, PND, increased LE swelling, palpitations, dizziness or syncope.   Pt denies polydipsia, polyuria, or new focal neuro s/s, except for slightly worsening head tremor noticed by wife, pt asking about any concern or any tx needed.  Still smoking, not ready to quit.  BP has been < 140;90 at home.  Has mild persistent decreased hearing ove many years, not really worse today though wondering about wax buildup.  Conts to see pain management, new one since a previous provider had license revoked.      Pt asking for labs for next week.  Still sees Dr Aram Beecham Readings from Last 3 Encounters:  03/08/21 143 lb (64.9 kg)  02/25/21 145 lb (65.8 kg)  08/24/20 152 lb (68.9 kg)   BP Readings from Last 3 Encounters:  03/08/21 (!) 148/80  02/25/21 124/76  08/24/20 (!) 200/106         Past Medical History:  Diagnosis Date   Allergic rhinitis, cause unspecified    Asthma    as child   Chronic lumbar radiculopathy    left with left drop foot/numbness   Chronic pain    to see Dr Delynn Flavin   COPD, severe ([place]) 06/22/2013   Factor 5 Leiden mutation, heterozygous ([place])    HTN (hypertension) 08/25/2019   Left foot drop    Lumbar disc disease    Other and unspecified hyperlipidemia 05/20/2013   Past Surgical History:  Procedure Laterality Date   SPINE SURGERY  12/2010   micro diskectomy    reports that he has been smoking cigarettes. He has a 30.00 pack-year smoking history. He has never used smokeless tobacco. He reports current alcohol use of about 2.0 - 4.0 standard drinks per week. He reports that he does not use drugs. family history includes COPD in his mother; Diabetes in his mother; Pulmonary embolism in  his father. No Known Allergies Current Outpatient Medications on File Prior to Visit  Medication Sig Dispense Refill   albuterol (ACCUNEB) 0.63 MG/3ML nebulizer solution Take 3 mLs (0.63 mg total) by nebulization every 6 (six) hours as needed for wheezing. 75 mL 12   albuterol (VENTOLIN HFA) 108 (90 Base) MCG/ACT inhaler Inhale 2 puffs into the lungs every 6 (six) hours as needed for wheezing or shortness of breath. 54 g 3   amLODipine (NORVASC) 10 MG tablet Take 1 tablet (10 mg total) by mouth daily. 90 tablet 3   ibuprofen (ADVIL,MOTRIN) 400 MG tablet Take 1 tablet (400 mg total) by mouth every 8 (eight) hours as needed for pain. 90 tablet 1   NAPROXEN PO Take 1 tablet by mouth as directed.     NARCAN 4 MG/0.1ML LIQD nasal spray kit      oxyCODONE (OXY IR/ROXICODONE) 5 MG immediate release tablet Take 1 tablet (5 mg total) by mouth every 8 (eight) hours as needed. May take an extra tablet for severe pain occasionally 100 tablet 0   sildenafil (VIAGRA) 100 MG tablet Take 0.5-1 tablets (50-100 mg total) by mouth daily as needed for erectile dysfunction. 5 tablet 11   Tiotropium Bromide-Olodaterol (STIOLTO RESPIMAT) 2.5-2.5 MCG/ACT AERS Inhale 2 puffs into the lungs  daily. 4 g 11   Tiotropium Bromide-Olodaterol (STIOLTO RESPIMAT) 2.5-2.5 MCG/ACT AERS Inhale 2 puffs into the lungs daily. 4 g 0   tiZANidine (ZANAFLEX) 4 MG tablet TAKE 1 TABLET(4 MG) BY MOUTH EVERY 8 HOURS AS NEEDED FOR MUSCLE SPASMS 90 tablet 0   traZODone (DESYREL) 50 MG tablet      Vitamin D, Ergocalciferol, (DRISDOL) 1.25 MG (50000 UNIT) CAPS capsule      atorvastatin (LIPITOR) 10 MG tablet Take 1 tablet (10 mg total) by mouth daily. (Patient not taking: Reported on 03/08/2021) 90 tablet 3   predniSONE (DELTASONE) 10 MG tablet Take  4 each am x 2 days,   2 each am x 2 days,  1 each am x 2 days and stop (Patient not taking: Reported on 03/08/2021) 14 tablet 11   No current facility-administered medications on file prior to visit.         ROS:  All others reviewed and negative.  Objective        PE:  BP (!) 148/80 (BP Location: Right Arm, Patient Position: Sitting, Cuff Size: Large)   Pulse 83   Temp 98.1 F (36.7 C) (Oral)   Ht $R'5\' 8"'lp$  (1.727 m)   Wt 143 lb (64.9 kg)   SpO2 97%   BMI 21.74 kg/m                 Constitutional: Pt appears in NAD               HENT: Head: NCAT.                Right Ear: External ear normal.                 Left Ear: External ear normal. , no significant wax impactions               Eyes: . Pupils are equal, round, and reactive to light. Conjunctivae and EOM are normal               Nose: without d/c or deformity               Neck: Neck supple. Gross normal ROM               Cardiovascular: Normal rate and regular rhythm.                 Pulmonary/Chest: Effort normal and breath sounds without rales or wheezing.                Abd:  Soft, NT, ND, + BS, no organomegaly               Neurological: Pt is alert. At baseline orientation, motor grossly intact, has mild head tremor               Skin: Skin is warm. No rashes, no other new lesions, LE edema - none               Psychiatric: Pt behavior is normal without agitation   Micro: none  Cardiac tracings I have personally interpreted today:  none  Pertinent Radiological findings (summarize): none   Lab Results  Component Value Date   WBC 8.9 02/24/2019   HGB 14.5 02/24/2019   HCT 42.7 02/24/2019   PLT 248.0 02/24/2019   GLUCOSE 85 02/24/2019   CHOL 213 (H) 02/24/2019   TRIG 205.0 (H) 02/24/2019   HDL 50.60 02/24/2019   LDLDIRECT 137.0 02/24/2019   [place]  105 (H) 12/19/2017   ALT 15 02/24/2019   AST 18 02/24/2019   NA 138 02/24/2019   K 4.2 02/24/2019   CL 103 02/24/2019   CREATININE 1.02 02/24/2019   BUN 12 02/24/2019   CO2 29 02/24/2019   TSH 1.41 02/24/2019   PSA 1.08 02/24/2019   Assessment/Plan:  Cody Grant is a 60 y.o. Unavailable [8] male with  has a past medical history of Allergic rhinitis,  cause unspecified, Asthma, Chronic lumbar radiculopathy, Chronic pain, COPD, severe ([place]) (06/22/2013), Factor 5 Leiden mutation, heterozygous ([place]), HTN (hypertension) (08/25/2019), Left foot drop, Lumbar disc disease, and Other and unspecified hyperlipidemia (05/20/2013).  COPD GOLD  III / still smoking with MS phenotype Stable overall, cont albuterol hfa prn  Hyperlipidemia Lab Results  Component Value Date   LDLCALC 105 (H) 12/19/2017   prevously mild uncontrolled, pt to continue current statin lipitor 10, lower chol diet, and f/u lab next wk per pt request   Cigarette smoker Counseled to quit, pt not ready  Hypertension, uncontrolled BP Readings from Last 3 Encounters:  03/08/21 (!) 148/80  02/25/21 124/76  08/24/20 (!) 200/106   Uncontrolled here, suspect may be elsewhere as well, but pt states BP ok at home,, pt to continue medical treatment norvasc - declines change in tx   Vitamin D deficiency Has been low in past, enocuraged to restart vit d 2000 u qd, and for f/u lab  Benign head tremor With recent mild worsening uncontrolled, but remains mild and pt declines Beta blocker for now, or neuro referral  Followup: Return in about 6 months (around 09/05/2021).  Cathlean Cower, MD 03/08/2021 8:03 PM [facility]

## 2021-03-08 NOTE — Assessment & Plan Note (Signed)
Has been low in past, enocuraged to restart vit d 2000 u qd, and for f/u lab

## 2021-03-08 NOTE — Assessment & Plan Note (Signed)
Counseled to quit, pt not ready 

## 2021-03-08 NOTE — Patient Instructions (Signed)
Ok to follow the head tremor for now  Please continue all other medications as before, and refills have been done if requested.  Please have the pharmacy call with any other refills you may need.  Please continue your efforts at being more active, low cholesterol diet, and weight control.  Please keep your appointments with your specialists as you may have planned  Please go to the LAB at the blood drawing area for the tests to be done - at the [facility] in 1 wk as you mentioned  You will be contacted by phone if any changes need to be made immediately.  Otherwise, you will receive a letter about your results with an explanation, but please check with MyChart first.  Please remember to sign up for MyChart if you have not done so, as this will be important to you in the future with finding out test results, communicating by private email, and scheduling acute appointments online when needed.  Please make an Appointment to return in 6 months, or sooner if needed

## 2021-03-08 NOTE — Assessment & Plan Note (Signed)
Lab Results  Component Value Date   LDLCALC 105 (H) 12/19/2017   prevously mild uncontrolled, pt to continue current statin lipitor 10, lower chol diet, and f/u lab next wk per pt request

## 2021-04-24 ENCOUNTER — Other Ambulatory Visit: Payer: Self-pay

## 2021-04-24 ENCOUNTER — Inpatient Hospital Stay (HOSPITAL_COMMUNITY)
Admission: EM | Admit: 2021-04-24 | Discharge: 2021-04-27 | DRG: 871 | Disposition: A | Discharge status: Home / Self Care | Payer: Medicare HMO | Attending: Internal Medicine | Admitting: Internal Medicine

## 2021-04-24 DIAGNOSIS — G4700 Insomnia, unspecified: Secondary | ICD-10-CM | POA: Diagnosis present

## 2021-04-24 DIAGNOSIS — I700 Atherosclerosis of aorta: Secondary | ICD-10-CM | POA: Diagnosis present

## 2021-04-24 DIAGNOSIS — F419 Anxiety disorder, unspecified: Secondary | ICD-10-CM | POA: Diagnosis present

## 2021-04-24 DIAGNOSIS — F17210 Nicotine dependence, cigarettes, uncomplicated: Secondary | ICD-10-CM | POA: Diagnosis present

## 2021-04-24 DIAGNOSIS — J441 Chronic obstructive pulmonary disease with (acute) exacerbation: Secondary | ICD-10-CM | POA: Diagnosis not present

## 2021-04-24 DIAGNOSIS — A419 Sepsis, unspecified organism: Secondary | ICD-10-CM | POA: Diagnosis not present

## 2021-04-24 DIAGNOSIS — E785 Hyperlipidemia, unspecified: Secondary | ICD-10-CM | POA: Diagnosis present

## 2021-04-24 DIAGNOSIS — J309 Allergic rhinitis, unspecified: Secondary | ICD-10-CM | POA: Diagnosis present

## 2021-04-24 DIAGNOSIS — M519 Unspecified thoracic, thoracolumbar and lumbosacral intervertebral disc disorder: Secondary | ICD-10-CM | POA: Diagnosis present

## 2021-04-24 DIAGNOSIS — Z20822 Contact with and (suspected) exposure to covid-19: Secondary | ICD-10-CM | POA: Diagnosis present

## 2021-04-24 DIAGNOSIS — J189 Pneumonia, unspecified organism: Secondary | ICD-10-CM | POA: Diagnosis present

## 2021-04-24 DIAGNOSIS — Z825 Family history of asthma and other chronic lower respiratory diseases: Secondary | ICD-10-CM

## 2021-04-24 DIAGNOSIS — M21372 Foot drop, left foot: Secondary | ICD-10-CM | POA: Diagnosis present

## 2021-04-24 DIAGNOSIS — G8929 Other chronic pain: Secondary | ICD-10-CM | POA: Diagnosis present

## 2021-04-24 DIAGNOSIS — M5416 Radiculopathy, lumbar region: Secondary | ICD-10-CM | POA: Diagnosis present

## 2021-04-24 DIAGNOSIS — I10 Essential (primary) hypertension: Secondary | ICD-10-CM | POA: Diagnosis present

## 2021-04-24 DIAGNOSIS — J440 Chronic obstructive pulmonary disease with acute lower respiratory infection: Secondary | ICD-10-CM | POA: Diagnosis present

## 2021-04-24 DIAGNOSIS — D649 Anemia, unspecified: Secondary | ICD-10-CM | POA: Diagnosis present

## 2021-04-24 DIAGNOSIS — Z79899 Other long term (current) drug therapy: Secondary | ICD-10-CM

## 2021-04-24 DIAGNOSIS — J9601 Acute respiratory failure with hypoxia: Secondary | ICD-10-CM | POA: Diagnosis present

## 2021-04-24 DIAGNOSIS — E871 Hypo-osmolality and hyponatremia: Secondary | ICD-10-CM | POA: Diagnosis present

## 2021-04-24 DIAGNOSIS — D6851 Activated protein C resistance: Secondary | ICD-10-CM | POA: Diagnosis present

## 2021-04-24 NOTE — ED Triage Notes (Signed)
Ptc/o shortness of breath and chest pain. Pt c/o a sharp pain on the left side of his chest.

## 2021-04-25 ENCOUNTER — Emergency Department (HOSPITAL_COMMUNITY): Payer: Medicare HMO

## 2021-04-25 ENCOUNTER — Encounter (HOSPITAL_COMMUNITY): Payer: Self-pay | Admitting: Student

## 2021-04-25 DIAGNOSIS — E871 Hypo-osmolality and hyponatremia: Secondary | ICD-10-CM | POA: Diagnosis present

## 2021-04-25 DIAGNOSIS — E785 Hyperlipidemia, unspecified: Secondary | ICD-10-CM | POA: Diagnosis present

## 2021-04-25 DIAGNOSIS — Z79899 Other long term (current) drug therapy: Secondary | ICD-10-CM | POA: Diagnosis not present

## 2021-04-25 DIAGNOSIS — A419 Sepsis, unspecified organism: Principal | ICD-10-CM

## 2021-04-25 DIAGNOSIS — F419 Anxiety disorder, unspecified: Secondary | ICD-10-CM | POA: Diagnosis present

## 2021-04-25 DIAGNOSIS — J440 Chronic obstructive pulmonary disease with acute lower respiratory infection: Secondary | ICD-10-CM | POA: Diagnosis present

## 2021-04-25 DIAGNOSIS — I10 Essential (primary) hypertension: Secondary | ICD-10-CM | POA: Diagnosis present

## 2021-04-25 DIAGNOSIS — D6851 Activated protein C resistance: Secondary | ICD-10-CM | POA: Diagnosis present

## 2021-04-25 DIAGNOSIS — D649 Anemia, unspecified: Secondary | ICD-10-CM | POA: Diagnosis present

## 2021-04-25 DIAGNOSIS — J9601 Acute respiratory failure with hypoxia: Secondary | ICD-10-CM | POA: Diagnosis present

## 2021-04-25 DIAGNOSIS — J189 Pneumonia, unspecified organism: Secondary | ICD-10-CM | POA: Diagnosis present

## 2021-04-25 DIAGNOSIS — M21372 Foot drop, left foot: Secondary | ICD-10-CM | POA: Diagnosis present

## 2021-04-25 DIAGNOSIS — F17210 Nicotine dependence, cigarettes, uncomplicated: Secondary | ICD-10-CM | POA: Diagnosis present

## 2021-04-25 DIAGNOSIS — G8929 Other chronic pain: Secondary | ICD-10-CM | POA: Diagnosis present

## 2021-04-25 DIAGNOSIS — J309 Allergic rhinitis, unspecified: Secondary | ICD-10-CM | POA: Diagnosis present

## 2021-04-25 DIAGNOSIS — J441 Chronic obstructive pulmonary disease with (acute) exacerbation: Secondary | ICD-10-CM | POA: Diagnosis present

## 2021-04-25 DIAGNOSIS — I700 Atherosclerosis of aorta: Secondary | ICD-10-CM | POA: Diagnosis present

## 2021-04-25 DIAGNOSIS — Z825 Family history of asthma and other chronic lower respiratory diseases: Secondary | ICD-10-CM | POA: Diagnosis not present

## 2021-04-25 DIAGNOSIS — Z20822 Contact with and (suspected) exposure to covid-19: Secondary | ICD-10-CM | POA: Diagnosis present

## 2021-04-25 DIAGNOSIS — M5416 Radiculopathy, lumbar region: Secondary | ICD-10-CM | POA: Diagnosis present

## 2021-04-25 DIAGNOSIS — M519 Unspecified thoracic, thoracolumbar and lumbosacral intervertebral disc disorder: Secondary | ICD-10-CM | POA: Diagnosis present

## 2021-04-25 DIAGNOSIS — G4700 Insomnia, unspecified: Secondary | ICD-10-CM | POA: Diagnosis present

## 2021-04-25 LAB — CBC WITH DIFFERENTIAL/PLATELET
Abs Immature Granulocytes: 0.12 10*3/uL — ABNORMAL HIGH (ref 0.00–0.07)
Abs Immature Granulocytes: 0.23 10*3/uL — ABNORMAL HIGH (ref 0.00–0.07)
Basophils Absolute: 0 10*3/uL (ref 0.0–0.1)
Basophils Absolute: 0.1 10*3/uL (ref 0.0–0.1)
Basophils Relative: 0 %
Basophils Relative: 0 %
Eosinophils Absolute: 0 10*3/uL (ref 0.0–0.5)
Eosinophils Absolute: 0 10*3/uL (ref 0.0–0.5)
Eosinophils Relative: 0 %
Eosinophils Relative: 0 %
HCT: 35.3 % — ABNORMAL LOW (ref 39.0–52.0)
HCT: 37.7 % — ABNORMAL LOW (ref 39.0–52.0)
Hemoglobin: 11.2 g/dL — ABNORMAL LOW (ref 13.0–17.0)
Hemoglobin: 12.5 g/dL — ABNORMAL LOW (ref 13.0–17.0)
Immature Granulocytes: 1 %
Immature Granulocytes: 1 %
Lymphocytes Relative: 2 %
Lymphocytes Relative: 5 %
Lymphs Abs: 0.3 10*3/uL — ABNORMAL LOW (ref 0.7–4.0)
Lymphs Abs: 1.3 10*3/uL (ref 0.7–4.0)
MCH: 30.5 pg (ref 26.0–34.0)
MCH: 31 pg (ref 26.0–34.0)
MCHC: 31.7 g/dL (ref 30.0–36.0)
MCHC: 33.2 g/dL (ref 30.0–36.0)
MCV: 93.5 fL (ref 80.0–100.0)
MCV: 96.2 fL (ref 80.0–100.0)
Monocytes Absolute: 0.6 10*3/uL (ref 0.1–1.0)
Monocytes Absolute: 2.6 10*3/uL — ABNORMAL HIGH (ref 0.1–1.0)
Monocytes Relative: 11 %
Monocytes Relative: 4 %
Neutro Abs: 16.2 10*3/uL — ABNORMAL HIGH (ref 1.7–7.7)
Neutro Abs: 20.6 10*3/uL — ABNORMAL HIGH (ref 1.7–7.7)
Neutrophils Relative %: 83 %
Neutrophils Relative %: 93 %
Platelets: 179 10*3/uL (ref 150–400)
Platelets: 232 10*3/uL (ref 150–400)
RBC: 3.67 MIL/uL — ABNORMAL LOW (ref 4.22–5.81)
RBC: 4.03 MIL/uL — ABNORMAL LOW (ref 4.22–5.81)
RDW: 12.7 % (ref 11.5–15.5)
RDW: 12.7 % (ref 11.5–15.5)
WBC: 17.2 10*3/uL — ABNORMAL HIGH (ref 4.0–10.5)
WBC: 24.8 10*3/uL — ABNORMAL HIGH (ref 4.0–10.5)
nRBC: 0 % (ref 0.0–0.2)
nRBC: 0 % (ref 0.0–0.2)

## 2021-04-25 LAB — URINALYSIS, ROUTINE W REFLEX MICROSCOPIC
Bilirubin Urine: NEGATIVE
Glucose, UA: 100 mg/dL — AB
Ketones, ur: 15 mg/dL — AB
Leukocytes,Ua: NEGATIVE
Nitrite: NEGATIVE
Protein, ur: NEGATIVE mg/dL
Specific Gravity, Urine: 1.01 (ref 1.005–1.030)
pH: 5.5 (ref 5.0–8.0)

## 2021-04-25 LAB — TROPONIN I (HIGH SENSITIVITY)
Troponin I (High Sensitivity): 29 ng/L — ABNORMAL HIGH (ref <18)
Troponin I (High Sensitivity): 11 ng/L (ref <18)
Troponin I (High Sensitivity): 12 ng/L (ref <18)

## 2021-04-25 LAB — I-STAT VENOUS BLOOD GAS, ED
Acid-base deficit: 1 mmol/L (ref 0.0–2.0)
Bicarbonate: 22.7 mmol/L (ref 20.0–28.0)
Calcium, Ion: 1.08 mmol/L — ABNORMAL LOW (ref 1.15–1.40)
HCT: 38 % — ABNORMAL LOW (ref 39.0–52.0)
Hemoglobin: 12.9 g/dL — ABNORMAL LOW (ref 13.0–17.0)
O2 Saturation: 96 %
Potassium: 3.7 mmol/L (ref 3.5–5.1)
Sodium: 131 mmol/L — ABNORMAL LOW (ref 135–145)
TCO2: 24 mmol/L (ref 22–32)
pCO2, Ven: 34.8 mmHg — ABNORMAL LOW (ref 44.0–60.0)
pH, Ven: 7.423 (ref 7.250–7.430)
pO2, Ven: 83 mmHg — ABNORMAL HIGH (ref 32.0–45.0)

## 2021-04-25 LAB — RESP PANEL BY RT-PCR (FLU A&B, COVID) ARPGX2
Influenza A by PCR: NEGATIVE
Influenza B by PCR: NEGATIVE
SARS Coronavirus 2 by RT PCR: NEGATIVE

## 2021-04-25 LAB — BASIC METABOLIC PANEL
Anion gap: 12 (ref 5–15)
BUN: 10 mg/dL (ref 6–20)
CO2: 20 mmol/L — ABNORMAL LOW (ref 22–32)
Calcium: 8.2 mg/dL — ABNORMAL LOW (ref 8.9–10.3)
Chloride: 101 mmol/L (ref 98–111)
Creatinine, Ser: 1.06 mg/dL (ref 0.61–1.24)
GFR, Estimated: >60 mL/min (ref >60)
Glucose, Bld: 200 mg/dL — ABNORMAL HIGH (ref 70–99)
Potassium: 3.4 mmol/L — ABNORMAL LOW (ref 3.5–5.1)
Sodium: 133 mmol/L — ABNORMAL LOW (ref 135–145)

## 2021-04-25 LAB — COMPREHENSIVE METABOLIC PANEL
ALT: 24 U/L (ref 0–44)
AST: 24 U/L (ref 15–41)
Albumin: 3.5 g/dL (ref 3.5–5.0)
Alkaline Phosphatase: 76 U/L (ref 38–126)
Anion gap: 9 (ref 5–15)
BUN: 11 mg/dL (ref 6–20)
CO2: 22 mmol/L (ref 22–32)
Calcium: 8.6 mg/dL — ABNORMAL LOW (ref 8.9–10.3)
Chloride: 98 mmol/L (ref 98–111)
Creatinine, Ser: 1.1 mg/dL (ref 0.61–1.24)
GFR, Estimated: >60 mL/min (ref >60)
Glucose, Bld: 135 mg/dL — ABNORMAL HIGH (ref 70–99)
Potassium: 3.7 mmol/L (ref 3.5–5.1)
Sodium: 129 mmol/L — ABNORMAL LOW (ref 135–145)
Total Bilirubin: 0.6 mg/dL (ref 0.3–1.2)
Total Protein: 6.7 g/dL (ref 6.5–8.1)

## 2021-04-25 LAB — STREP PNEUMONIAE URINARY ANTIGEN: Strep Pneumo Urinary Antigen: NEGATIVE

## 2021-04-25 LAB — PROCALCITONIN: Procalcitonin: 0.33 ng/mL

## 2021-04-25 LAB — HIV ANTIBODY (ROUTINE TESTING W REFLEX): HIV Screen 4th Generation wRfx: NONREACTIVE

## 2021-04-25 LAB — URINALYSIS, MICROSCOPIC (REFLEX)
Bacteria, UA: NONE SEEN
Squamous Epithelial / HPF: NONE SEEN (ref 0–5)

## 2021-04-25 LAB — IRON AND TIBC: Iron: UNDETERMINED ug/dL (ref 45–182)

## 2021-04-25 LAB — RETICULOCYTES
Immature Retic Fract: 12.8 % (ref 2.3–15.9)
RBC.: 3.69 MIL/uL — ABNORMAL LOW (ref 4.22–5.81)
Retic Count, Absolute: 36.2 10*3/uL (ref 19.0–186.0)
Retic Ct Pct: 1 % (ref 0.4–3.1)

## 2021-04-25 LAB — LACTIC ACID, PLASMA
Lactic Acid, Venous: 1.1 mmol/L (ref 0.5–1.9)
Lactic Acid, Venous: 1.2 mmol/L (ref 0.5–1.9)

## 2021-04-25 LAB — APTT: aPTT: 36 seconds (ref 24–36)

## 2021-04-25 LAB — FOLATE: Folate: 11.1 ng/mL (ref >5.9)

## 2021-04-25 LAB — PROTIME-INR
INR: 1 (ref 0.8–1.2)
Prothrombin Time: 13.5 seconds (ref 11.4–15.2)

## 2021-04-25 LAB — FERRITIN: Ferritin: 268 ng/mL (ref 24–336)

## 2021-04-25 LAB — OSMOLALITY, URINE: Osmolality, Ur: 353 mOsm/kg (ref 300–900)

## 2021-04-25 LAB — SODIUM, URINE, RANDOM: Sodium, Ur: <10 mmol/L

## 2021-04-25 LAB — VITAMIN B12: Vitamin B-12: 164 pg/mL — ABNORMAL LOW (ref 180–914)

## 2021-04-25 MED ORDER — IOHEXOL 350 MG/ML SOLN
75.0000 mL | Freq: Once | INTRAVENOUS | Status: AC | PRN
  Administered 2021-04-25: 02:00:00 75 mL via INTRAVENOUS

## 2021-04-25 MED ORDER — IPRATROPIUM-ALBUTEROL 0.5-2.5 (3) MG/3ML IN SOLN
3.0000 mL | Freq: Once | RESPIRATORY_TRACT | Status: AC
  Administered 2021-04-25: 02:00:00 3 mL via RESPIRATORY_TRACT
  Filled 2021-04-25: qty 3

## 2021-04-25 MED ORDER — LACTATED RINGERS IV SOLN: INTRAVENOUS | Status: DC

## 2021-04-25 MED ORDER — METRONIDAZOLE 500 MG/100ML IV SOLN
500.0000 mg | Freq: Once | INTRAVENOUS | Status: AC
  Administered 2021-04-25: 01:00:00 500 mg via INTRAVENOUS
  Filled 2021-04-25 (×2): qty 100

## 2021-04-25 MED ORDER — ACETAMINOPHEN 325 MG PO TABS: 650.0000 mg | ORAL_TABLET | Freq: Four times a day (QID) | ORAL | Status: DC | PRN

## 2021-04-25 MED ORDER — ARFORMOTEROL TARTRATE 15 MCG/2ML IN NEBU
15.0000 ug | INHALATION_SOLUTION | Freq: Two times a day (BID) | RESPIRATORY_TRACT | Status: DC
  Administered 2021-04-25 – 2021-04-27 (×5): 15 ug via RESPIRATORY_TRACT
  Filled 2021-04-25 (×5): qty 2

## 2021-04-25 MED ORDER — SODIUM CHLORIDE 0.9 % IV SOLN
2.0000 g | INTRAVENOUS | Status: DC
  Administered 2021-04-25 – 2021-04-27 (×3): 2 g via INTRAVENOUS
  Filled 2021-04-25 (×3): qty 20

## 2021-04-25 MED ORDER — ASPIRIN EC 81 MG PO TBEC
81.0000 mg | DELAYED_RELEASE_TABLET | Freq: Every day | ORAL | Status: DC
  Administered 2021-04-25 – 2021-04-26 (×2): 81 mg via ORAL
  Filled 2021-04-25 (×2): qty 1

## 2021-04-25 MED ORDER — ENOXAPARIN SODIUM 40 MG/0.4ML IJ SOSY
40.0000 mg | PREFILLED_SYRINGE | INTRAMUSCULAR | Status: DC
  Administered 2021-04-25 – 2021-04-26 (×2): 40 mg via SUBCUTANEOUS
  Filled 2021-04-25 (×2): qty 0.4

## 2021-04-25 MED ORDER — ACETAMINOPHEN 325 MG PO TABS
650.0000 mg | ORAL_TABLET | Freq: Once | ORAL | Status: AC
  Administered 2021-04-25: 07:00:00 650 mg via ORAL
  Filled 2021-04-25: qty 2

## 2021-04-25 MED ORDER — ALBUTEROL SULFATE (2.5 MG/3ML) 0.083% IN NEBU
10.0000 mg/h | INHALATION_SOLUTION | Freq: Once | RESPIRATORY_TRACT | Status: AC
  Administered 2021-04-25: 03:00:00 10 mg/h via RESPIRATORY_TRACT
  Filled 2021-04-25: qty 12

## 2021-04-25 MED ORDER — TIZANIDINE HCL 4 MG PO TABS
4.0000 mg | ORAL_TABLET | Freq: Three times a day (TID) | ORAL | Status: DC | PRN
  Administered 2021-04-25 – 2021-04-26 (×3): 4 mg via ORAL
  Filled 2021-04-25 (×3): qty 1

## 2021-04-25 MED ORDER — IPRATROPIUM-ALBUTEROL 0.5-2.5 (3) MG/3ML IN SOLN
3.0000 mL | Freq: Once | RESPIRATORY_TRACT | Status: AC
  Administered 2021-04-25: 01:00:00 3 mL via RESPIRATORY_TRACT
  Filled 2021-04-25: qty 3

## 2021-04-25 MED ORDER — IPRATROPIUM-ALBUTEROL 0.5-2.5 (3) MG/3ML IN SOLN
3.0000 mL | Freq: Four times a day (QID) | RESPIRATORY_TRACT | Status: DC
  Administered 2021-04-25 – 2021-04-26 (×5): 3 mL via RESPIRATORY_TRACT
  Filled 2021-04-25 (×5): qty 3

## 2021-04-25 MED ORDER — ACETAMINOPHEN 650 MG RE SUPP: 650.0000 mg | Freq: Four times a day (QID) | RECTAL | Status: DC | PRN

## 2021-04-25 MED ORDER — POTASSIUM CHLORIDE CRYS ER 20 MEQ PO TBCR
40.0000 meq | EXTENDED_RELEASE_TABLET | Freq: Once | ORAL | Status: AC
  Administered 2021-04-25: 09:00:00 40 meq via ORAL
  Filled 2021-04-25: qty 2

## 2021-04-25 MED ORDER — SODIUM CHLORIDE 0.9 % IV SOLN
500.0000 mg | INTRAVENOUS | Status: DC
  Administered 2021-04-25 – 2021-04-27 (×3): 500 mg via INTRAVENOUS
  Filled 2021-04-25 (×3): qty 5

## 2021-04-25 MED ORDER — LACTATED RINGERS IV BOLUS (SEPSIS)
1000.0000 mL | Freq: Once | INTRAVENOUS | Status: AC
  Administered 2021-04-25: 01:00:00 1000 mL via INTRAVENOUS

## 2021-04-25 MED ORDER — HYDROMORPHONE HCL 1 MG/ML IJ SOLN
0.5000 mg | Freq: Once | INTRAMUSCULAR | Status: AC
  Administered 2021-04-25: 02:00:00 0.5 mg via INTRAVENOUS
  Filled 2021-04-25: qty 1

## 2021-04-25 MED ORDER — UMECLIDINIUM BROMIDE 62.5 MCG/ACT IN AEPB
1.0000 | INHALATION_SPRAY | Freq: Every day | RESPIRATORY_TRACT | Status: DC
  Administered 2021-04-25 – 2021-04-27 (×3): 1 via RESPIRATORY_TRACT
  Filled 2021-04-25: qty 7

## 2021-04-25 MED ORDER — METHYLPREDNISOLONE SODIUM SUCC 125 MG IJ SOLR
125.0000 mg | Freq: Two times a day (BID) | INTRAMUSCULAR | Status: AC
  Administered 2021-04-25 (×2): 125 mg via INTRAVENOUS
  Filled 2021-04-25 (×2): qty 2

## 2021-04-25 MED ORDER — VANCOMYCIN HCL 1250 MG/250ML IV SOLN
1250.0000 mg | Freq: Once | INTRAVENOUS | Status: AC
  Administered 2021-04-25: 03:00:00 1250 mg via INTRAVENOUS
  Filled 2021-04-25 (×2): qty 250

## 2021-04-25 MED ORDER — AMLODIPINE BESYLATE 10 MG PO TABS
10.0000 mg | ORAL_TABLET | Freq: Every day | ORAL | Status: DC
  Administered 2021-04-25 – 2021-04-26 (×2): 10 mg via ORAL
  Filled 2021-04-25 (×2): qty 1

## 2021-04-25 MED ORDER — SODIUM CHLORIDE 0.9 % IV SOLN
2.0000 g | Freq: Once | INTRAVENOUS | Status: AC
  Administered 2021-04-25: 01:00:00 2 g via INTRAVENOUS
  Filled 2021-04-25 (×2): qty 2

## 2021-04-25 MED ORDER — MAGNESIUM SULFATE 2 GM/50ML IV SOLN
2.0000 g | Freq: Once | INTRAVENOUS | Status: AC
  Administered 2021-04-25: 03:00:00 2 g via INTRAVENOUS
  Filled 2021-04-25: qty 50

## 2021-04-25 MED ORDER — METHYLPREDNISOLONE SODIUM SUCC 125 MG IJ SOLR
125.0000 mg | Freq: Once | INTRAMUSCULAR | Status: AC
  Administered 2021-04-25: 01:00:00 125 mg via INTRAVENOUS
  Filled 2021-04-25: qty 2

## 2021-04-25 MED ORDER — OXYCODONE HCL 5 MG PO TABS
10.0000 mg | ORAL_TABLET | Freq: Three times a day (TID) | ORAL | Status: DC | PRN
  Administered 2021-04-25 – 2021-04-27 (×4): 10 mg via ORAL
  Filled 2021-04-25 (×4): qty 2

## 2021-04-25 NOTE — ED Provider Notes (Signed)
[place] EMERGENCY DEPARTMENT Provider Note   CSN: [id number] Arrival date & time: 04/24/21  2312     History Chief Complaint  Patient presents with   Shortness of Breath   Chest Pain    Cody Grant is a 60 y.o. male with a hx of COPD, hypertension, factor V Leiden, and anxiety who presents to the emergency department with complaints of shortness of breath for the past few days with chest pain that began shortly prior to arrival tonight.  Patient states that he is having a flare of his COPD and has had shortness of breath, increased wheezing from baseline, and cough productive of phlegm sputum with subjective fevers over the past few days.  He has been utilizing his breathing treatments with some mild relief.  Tonight he developed pretty sudden onset pain to the left lower chest that is worse with deep breathing.  No alleviating factors.  Denies nausea, vomiting, abdominal pain, leg pain/swelling, hemoptysis, recent surgery/trauma, recent long travel, hormone use, personal hx of cancer, or hx of DVT/PE.  His father had history of pulmonary embolism which he passed away from.   HPI     Past Medical History:  Diagnosis Date   Allergic rhinitis, cause unspecified    Asthma    as child   Chronic lumbar radiculopathy    left with left drop foot/numbness   Chronic pain    to see Dr Delynn Flavin   COPD, severe ([place]) 06/22/2013   Factor 5 Leiden mutation, heterozygous ([place])    HTN (hypertension) 08/25/2019   Left foot drop    Lumbar disc disease    Other and unspecified hyperlipidemia 05/20/2013    Patient Active Problem List   Diagnosis Date Noted   Benign head tremor 03/08/2021   Aortic atherosclerosis ([place]) 05/06/2020   Vitamin D deficiency 02/24/2020   Anxiety 02/24/2020   Hypertension, uncontrolled 08/25/2019   Bilateral hearing loss 08/25/2019   Rash 06/25/2018   Proteinuria 06/21/2017   COPD exacerbation ([place]) 04/27/2016   Right rotator cuff tear  11/10/2015   Right shoulder pain 10/03/2015   Eczema 06/14/2015   Cigarette smoker 05/22/2014   COPD GOLD  III / still smoking with MS phenotype 06/22/2013   Encounter for well adult exam with abnormal findings 05/20/2013   Cough 05/20/2013   Hyperlipidemia 05/20/2013   Asthma    Factor 5 Leiden mutation, heterozygous ([facility])    Allergic rhinitis    Lumbar disc disease    Left foot drop    Chronic lumbar radiculopathy    Chronic pain    Postlaminectomy syndrome, lumbar region 12/28/2011    Past Surgical History:  Procedure Laterality Date   SPINE SURGERY  12/2010   micro diskectomy       Family History  Problem Relation Age of Onset   COPD Mother    Diabetes Mother    Pulmonary embolism Father     Social History   Tobacco Use   Smoking status: Every Day    Packs/day: 1.00    Years: 30.00    Pack years: 30.00    Types: Cigarettes   Smokeless tobacco: Never   Tobacco comments:    5 cigarettes smoked daily 02/26/20 [facility]   Vaping Use   Vaping Use: Every day   Start date: 01/29/2018   Substances: Nicotine, Flavoring  Substance Use Topics   Alcohol use: Yes    Alcohol/week: 2.0 - 4.0 standard drinks    Types: 1 - 2 Glasses of wine,  1 - 2 Cans of beer per week   Drug use: No    Home Medications Prior to Admission medications   Medication Sig Start Date End Date Taking? Authorizing Provider  albuterol (ACCUNEB) 0.63 MG/3ML nebulizer solution Take 3 mLs (0.63 mg total) by nebulization every 6 (six) hours as needed for wheezing. 03/04/21   Tanda Rockers, MD  albuterol (VENTOLIN HFA) 108 (90 Base) MCG/ACT inhaler Inhale 2 puffs into the lungs every 6 (six) hours as needed for wheezing or shortness of breath. 08/25/19   Biagio Borg, MD  amLODipine (NORVASC) 10 MG tablet Take 1 tablet (10 mg total) by mouth daily. 08/24/20   Biagio Borg, MD  atorvastatin (LIPITOR) 10 MG tablet Take 1 tablet (10 mg total) by mouth daily. Patient not taking: Reported on 03/08/2021 08/24/20    Biagio Borg, MD  ibuprofen (ADVIL,MOTRIN) 400 MG tablet Take 1 tablet (400 mg total) by mouth every 8 (eight) hours as needed for pain. 01/26/12   Kirsteins, Luanna Salk, MD  NAPROXEN PO Take 1 tablet by mouth as directed.    [provider]  NARCAN 4 MG/0.1ML LIQD nasal spray kit  11/07/19   [provider]  oxyCODONE (OXY IR/ROXICODONE) 5 MG immediate release tablet Take 1 tablet (5 mg total) by mouth every 8 (eight) hours as needed. May take an extra tablet for severe pain occasionally 07/25/16   Biagio Borg, MD  predniSONE (DELTASONE) 10 MG tablet Take  4 each am x 2 days,   2 each am x 2 days,  1 each am x 2 days and stop Patient not taking: Reported on 03/08/2021 02/25/21   Tanda Rockers, MD  sildenafil (VIAGRA) 100 MG tablet Take 0.5-1 tablets (50-100 mg total) by mouth daily as needed for erectile dysfunction. 08/25/19   Biagio Borg, MD  Tiotropium Bromide-Olodaterol (STIOLTO RESPIMAT) 2.5-2.5 MCG/ACT AERS Inhale 2 puffs into the lungs daily. 06/11/20   Biagio Borg, MD  Tiotropium Bromide-Olodaterol (STIOLTO RESPIMAT) 2.5-2.5 MCG/ACT AERS Inhale 2 puffs into the lungs daily. 02/25/21   Tanda Rockers, MD  tiZANidine (ZANAFLEX) 4 MG tablet TAKE 1 TABLET(4 MG) BY MOUTH EVERY 8 HOURS AS NEEDED FOR MUSCLE SPASMS 03/14/16   Lyndal Pulley, DO  traZODone (DESYREL) 50 MG tablet  04/14/20   [provider]  Vitamin D, Ergocalciferol, (DRISDOL) 1.25 MG (50000 UNIT) CAPS capsule  02/13/20   [provider]    Allergies    Patient has no known allergies.  Review of Systems   Review of Systems  Constitutional:  Positive for fever.  HENT:  Negative for ear pain and sore throat.   Respiratory:  Positive for cough, shortness of breath and wheezing.   Cardiovascular:  Positive for chest pain. Negative for leg swelling.  Gastrointestinal:  Negative for abdominal pain, constipation, diarrhea, nausea and vomiting.  Neurological:  Negative for syncope.  All other  systems reviewed and are negative.  Physical Exam Updated Vital Signs BP (!) 142/118    Pulse (!) 124    Temp (!) 101.4 F (38.6 C) (Oral)    Resp (!) 22    Ht $R'5\' 8"'lQ$  (1.727 m)    Wt 65.8 kg    SpO2 92%    BMI 22.05 kg/m   Physical Exam Vitals and nursing note reviewed.  Constitutional:      Appearance: He is not toxic-appearing.  HENT:     Head: Normocephalic and atraumatic.  Eyes:  General:        Right eye: No discharge.        Left eye: No discharge.     Conjunctiva/sclera: Conjunctivae normal.  Cardiovascular:     Rate and Rhythm: Regular rhythm. Tachycardia present.  Pulmonary:     Effort: Tachypnea present.     Breath sounds: Decreased air movement present. Wheezing present. No rhonchi or rales.  Abdominal:     General: There is no distension.     Palpations: Abdomen is soft.     Tenderness: There is no abdominal tenderness.  Musculoskeletal:     Cervical back: Neck supple.     Right lower leg: No tenderness. No edema.     Left lower leg: No tenderness. No edema.  Skin:    General: Skin is warm and dry.     Findings: No rash.  Neurological:     Mental Status: He is alert.     Comments: Clear speech.   Psychiatric:        Behavior: Behavior normal.    ED Results / Procedures / Treatments   Labs (all labs ordered are listed, but only abnormal results are displayed) Labs Reviewed  RESP PANEL BY RT-PCR (FLU A&B, COVID) ARPGX2  CULTURE, BLOOD (ROUTINE X 2)  CULTURE, BLOOD (ROUTINE X 2)  URINE CULTURE  LACTIC ACID, PLASMA  LACTIC ACID, PLASMA  COMPREHENSIVE METABOLIC PANEL  CBC WITH DIFFERENTIAL/PLATELET  PROTIME-INR  APTT  URINALYSIS, ROUTINE W REFLEX MICROSCOPIC  I-STAT VENOUS BLOOD GAS, ED    EKG None  Radiology CT Angio Chest PE W/Cm &/Or Wo Cm  Result Date: 04/25/2021 CLINICAL DATA:  Concern for pulmonary embolism. EXAM: CT ANGIOGRAPHY CHEST WITH CONTRAST TECHNIQUE: Multidetector CT imaging of the chest was performed using the standard  protocol during bolus administration of intravenous contrast. Multiplanar CT image reconstructions and MIPs were obtained to evaluate the vascular anatomy. CONTRAST:  47mL OMNIPAQUE IOHEXOL 350 MG/ML SOLN COMPARISON:  Chest CT dated 05/03/2020. FINDINGS: Cardiovascular: No cardiomegaly or pericardial effusion. The thoracic aorta is unremarkable. The origins of the great vessels of the aortic arch appear patent. No pulmonary artery embolus identified. Mediastinum/Nodes: Mildly enlarged bilateral hilar and mediastinal lymph nodes measuring 13 mm in short axis the subcarinal region and 15 mm in the right hilum. The esophagus is grossly unremarkable. No mediastinal fluid collection. Lungs/Pleura: Background of centrilobular emphysema. Scattered bilateral pulmonary nodules with tree-in-bud appearance most consistent with an inflammatory/infectious etiology, likely atypical or viral in nature. No pleural effusion pneumothorax. There is thickening of the bronchial walls. The central airways remain patent. Upper Abdomen: No acute abnormality. Musculoskeletal: No acute osseous pathology. Review of the MIP images confirms the above findings. IMPRESSION: 1. No CT evidence of pulmonary embolism. 2. Scattered bilateral pulmonary nodules with tree-in-bud appearance most consistent with an inflammatory/infectious etiology, likely atypical or viral in nature. 3. Mildly enlarged bilateral hilar and mediastinal lymph nodes, likely reactive. 4. Emphysema (ICD10-J43.9). Electronically Signed   By: Anner Crete M.D.   On: 04/25/2021 02:39   DG Chest Portable 1 View  Result Date: 04/25/2021 CLINICAL DATA:  Shortness of breath and chest pain. EXAM: PORTABLE CHEST 1 VIEW COMPARISON:  Chest radiograph dated 12/19/2017. chest CT dated 05/03/2020. FINDINGS: Background of emphysema. No focal consolidation, pleural effusion, or pneumothorax. The cardiac silhouette is within normal limits. No acute osseous pathology. IMPRESSION: No  active disease. Electronically Signed   By: Anner Crete M.D.   On: 04/25/2021 01:18    Procedures .Critical Care Performed by:  Alexius Ellington, Glynda Jaeger, PA-C Authorized by: Amaryllis Dyke, PA-C    CRITICAL CARE Performed by: Kennith Maes   Total critical care time: 35 minutes  Critical care time was exclusive of separately billable procedures and treating other patients.  Critical care was necessary to treat or prevent imminent or life-threatening deterioration.  Critical care was time spent personally by me on the following activities: development of treatment plan with patient and/or surrogate as well as nursing, discussions with consultants, evaluation of patient's response to treatment, examination of patient, obtaining history from patient or surrogate, ordering and performing treatments and interventions, ordering and review of laboratory studies, ordering and review of radiographic studies, pulse oximetry and re-evaluation of patient's condition.   Medications Ordered in ED Medications  lactated ringers infusion (has no administration in time range)  lactated ringers bolus 1,000 mL (has no administration in time range)    And  lactated ringers bolus 1,000 mL (has no administration in time range)  ceFEPIme (MAXIPIME) 2 g in sodium chloride 0.9 % 100 mL IVPB (has no administration in time range)  metroNIDAZOLE (FLAGYL) IVPB 500 mg (has no administration in time range)  vancomycin (VANCOREADY) IVPB 1250 mg/250 mL (has no administration in time range)    ED Course  I have reviewed the triage vital signs and the nursing notes.  Pertinent labs & imaging results that were available during my care of the patient were reviewed by me and considered in my medical decision making (see chart for details).    MDM Rules/Calculators/A&P                         Patient presents to the ED with complaints of shortness of breath for the past few days with development of  chest pain tonight.  On arrival patient is febrile, tachycardic, tachypneic, hypertensive, and has borderline SPO2 in the low 90s.  He has wheezing with very poor air movement throughout.  A code sepsis was activated from triage with orders for fluids and antibiotics.  Additionally have ordered steroids and DuoNeb breathing treatments.  DDx includes but is not limited to sepsis, pneumonia, pulmonary embolism, COPD exacerbation, atypical ACS.  Additional history obtained:  Additional history obtained from chart review & nursing note review.   Lab Tests:  I reviewed and interpreted labs, which included:  CBC: Leukocytosis at 24.8, mild anemia CMP: Mild hyponatremia, no critical electrolyte derangement. Lactic acid: Within normal limits APTT/PT/INR: WNL VBG: pH normal. Troponin: Mildly elevated at 29   On reassessment following first DuoNeb patient remains with expiratory wheezing throughout.  Will repeat DuoNeb.  Dilaudid ordered for his chest pain.  Imaging Studies ordered:  I ordered imaging studies which included chest x-ray and subsequently CT angio of the chest, I independently reviewed, formal radiology impression shows:  Chest x-ray: No active cardiopulmonary disease  On repeat assessment patient has had some mild relief in his chest pain status post Dilaudid, Tylenol was also ordered for his fever and pain, given his clear chest x-ray and his presentation I do have persistent concern for pulmonary embolism, will proceed with CT angio of the chest.  CTA: 1. No CT evidence of pulmonary embolism. 2. Scattered bilateral pulmonary nodules with tree-in-bud appearance most consistent with an inflammatory/infectious etiology, likely atypical or viral in nature. 3. Mildly enlarged bilateral hilar and mediastinal lymph nodes, likely reactive. 4. Emphysema  02:55: RE-EVAL: Remains with very poor air movement, wheezing still present, SpO2 89% on RA at times,  will start continuous neb and consult  hospitalist service for admission.   03:23: CONSULT: Discussed with hospitalist Dr. Hal Hope accepts admission.   Findings and plan of care discussed with supervising physician Dr. Betsey Holiday who is in agreement.   Portions of this note were generated with [employer]. Dictation errors may occur despite best attempts at proofreading.     Final Clinical Impression(s) / ED Diagnoses Final diagnoses:  Sepsis, due to unspecified organism, unspecified whether acute organ dysfunction present [facility])  COPD exacerbation [facility])    Rx / DC Orders ED Discharge Orders     None        Amaryllis Dyke, PA-C 04/25/21 0441    Orpah Greek, MD 04/25/21 [phone]

## 2021-04-25 NOTE — ED Notes (Signed)
Educated pt on urine sample need

## 2021-04-25 NOTE — Progress Notes (Signed)
This is 60 year old pleasant gentleman with a history of COPD who presented to ED yesterday with chest pain and shortness of breath and was admitted with sepsis secondary to pneumonia as well as acute COPD exacerbation.  Patient seen and examined in the ED.  He was getting continuous nebulizer despite of that, he was still having to stop to take breath while he was talking.  Although he said he was feeling better but on examination, he had diffuse bilateral inspiratory as well as expiratory wheezes with diminished breath sounds.  He received Solu-Medrol yesterday but no further Solu-Medrol continued.  I will start him on Solu-Medrol 125 mg twice daily for 2 doses today, we will place him on scheduled DuoNeb every 6 hour and continue his current antibiotics.  Potassium low, will replace that.  We will also check Legionella antigen.  He does not use any oxygen at home, currently he is on 2 L.  He meets criteria for acute hypoxic respiratory failure as well.

## 2021-04-25 NOTE — ED Notes (Signed)
Meal tray delivered to pt at this time.  

## 2021-04-25 NOTE — Plan of Care (Signed)

## 2021-04-25 NOTE — ED Notes (Signed)
PT ambulated w/ no difficulty to bathroom.

## 2021-04-25 NOTE — H&P (Addendum)
History and Physical    Cody Grant HYI:[id number] DOB: 09-30-1960 DOA: 04/24/2021  PCP: Biagio Borg, MD  Patient coming from: Home.  Chief Complaint: Chest pain and shortness of breath.  HPI: Cody Grant is a 60 y.o. male with history of hypertension, COPD with ongoing tobacco abuse presents to the ER with complaints of having left-sided pleuritic type of chest pain with shortness of breath productive of sputum since last evening 9 PM.  Pain is mostly across her left side of the chest on deep inspiration.  Denies any recent travel or sick contacts.  ED Course: In the ER patient was mildly febrile with temperature of 101.4 tachycardic in the 120s and WBC count of 24,000 with CT angio of the chest negative for PE but does show features concerning for pneumonia.  Patient also has new anemia.  Patient had blood cultures drawn and started on empiric antibiotic admitted for further management.  COVID test was negative.  Review of Systems: As per HPI, rest all negative.   Past Medical History:  Diagnosis Date   Allergic rhinitis, cause unspecified    Asthma    as child   Chronic lumbar radiculopathy    left with left drop foot/numbness   Chronic pain    to see Dr Delynn Flavin   COPD, severe ([place]) 06/22/2013   Factor 5 Leiden mutation, heterozygous ([place])    HTN (hypertension) 08/25/2019   Left foot drop    Lumbar disc disease    Other and unspecified hyperlipidemia 05/20/2013    Past Surgical History:  Procedure Laterality Date   SPINE SURGERY  12/2010   micro diskectomy     reports that he has been smoking cigarettes. He has a 30.00 pack-year smoking history. He has never used smokeless tobacco. He reports current alcohol use of about 2.0 - 4.0 standard drinks per week. He reports that he does not use drugs.  No Known Allergies  Family History  Problem Relation Age of Onset   COPD Mother    Diabetes Mother    Pulmonary embolism Father     Prior to Admission medications    Medication Sig Start Date End Date Taking? Authorizing Provider  acetaminophen (TYLENOL) 325 MG tablet Take 650 mg by mouth every 6 (six) hours as needed for headache, fever or moderate pain.   Yes [provider]  albuterol (ACCUNEB) 0.63 MG/3ML nebulizer solution Take 3 mLs (0.63 mg total) by nebulization every 6 (six) hours as needed for wheezing. 03/04/21  Yes Tanda Rockers, MD  albuterol (VENTOLIN HFA) 108 (90 Base) MCG/ACT inhaler Inhale 2 puffs into the lungs every 6 (six) hours as needed for wheezing or shortness of breath. 08/25/19  Yes Biagio Borg, MD  amLODipine (NORVASC) 10 MG tablet Take 1 tablet (10 mg total) by mouth daily. Patient taking differently: Take 10 mg by mouth at bedtime. 08/24/20  Yes Biagio Borg, MD  [facility] 4 MG/0.1ML LIQD nasal spray kit Place 0.4 mg into the nose as needed (accidental overdose). 11/07/19  Yes [provider]  Oxycodone HCl 10 MG TABS Take 10 mg by mouth 3 (three) times daily as needed (pain). 04/16/21  Yes [provider]  sildenafil (VIAGRA) 100 MG tablet Take 0.5-1 tablets (50-100 mg total) by mouth daily as needed for erectile dysfunction. 08/25/19  Yes Biagio Borg, MD  Tiotropium Bromide-Olodaterol (STIOLTO RESPIMAT) 2.5-2.5 MCG/ACT AERS Inhale 2 puffs into the lungs daily. 06/11/20  Yes Biagio Borg, MD  tiZANidine (ZANAFLEX)  4 MG tablet TAKE 1 TABLET(4 MG) BY MOUTH EVERY 8 HOURS AS NEEDED FOR MUSCLE SPASMS Patient taking differently: Take 4 mg by mouth every 8 (eight) hours as needed for muscle spasms. 03/14/16  Yes Lyndal Pulley, DO  traZODone (DESYREL) 50 MG tablet Take 50 mg by mouth at bedtime as needed for sleep. 04/14/20  Yes [provider]  Vitamin D, Ergocalciferol, (DRISDOL) 1.25 MG (50000 UNIT) CAPS capsule Take 50,000 Units by mouth every 14 (fourteen) days. 02/13/20  Yes [provider]  atorvastatin (LIPITOR) 10 MG tablet Take 1 tablet (10 mg total) by mouth daily. Patient not  taking: Reported on 03/08/2021 08/24/20   Biagio Borg, MD  ibuprofen (ADVIL,MOTRIN) 400 MG tablet Take 1 tablet (400 mg total) by mouth every 8 (eight) hours as needed for pain. Patient not taking: Reported on 04/25/2021 01/26/12   Charlett Blake, MD  oxyCODONE (OXY IR/ROXICODONE) 5 MG immediate release tablet Take 1 tablet (5 mg total) by mouth every 8 (eight) hours as needed. May take an extra tablet for severe pain occasionally Patient not taking: Reported on 04/25/2021 07/25/16   Biagio Borg, MD  predniSONE (DELTASONE) 10 MG tablet Take  4 each am x 2 days,   2 each am x 2 days,  1 each am x 2 days and stop Patient not taking: Reported on 04/25/2021 02/25/21   Tanda Rockers, MD  Tiotropium Bromide-Olodaterol (STIOLTO RESPIMAT) 2.5-2.5 MCG/ACT AERS Inhale 2 puffs into the lungs daily. Patient not taking: Reported on 04/25/2021 02/25/21   Tanda Rockers, MD    Physical Exam: Constitutional: Moderately built and nourished. Vitals:   04/25/21 0430 04/25/21 0500 04/25/21 0505 04/25/21 0530  BP: 130/62  (!) 151/75 (!) 124/57  Pulse: (!) 102  (!) 111 100  Resp: (!) 26 (!) 27 17 (!) 28  Temp:   99.2 F (37.3 C)   TempSrc:   Oral   SpO2: 96%  93% 94%  Weight:      Height:       Eyes: Anicteric no pallor. ENMT: No discharge from the ears eyes nose and mouth. Neck: No mass felt.  No neck rigidity. Respiratory: Bilateral expiratory wheeze. Cardiovascular: S1-S2 heard. Abdomen: Soft nontender bowel sound present. Musculoskeletal: No edema. Skin: No rash. Neurologic: Alert awake oriented to time place and person.  Moves all extremities. Psychiatric: Appears normal.  Normal affect.   Labs on Admission: I have personally reviewed following labs and imaging studies  CBC: Recent Labs  Lab 04/25/21 0001 04/25/21 0046  WBC 24.8*  --   NEUTROABS 20.6*  --   HGB 12.5* 12.9*  HCT 37.7* 38.0*  MCV 93.5  --   PLT 232  --    Basic Metabolic Panel: Recent Labs  Lab  04/25/21 0001 04/25/21 0046  NA 129* 131*  K 3.7 3.7  CL 98  --   CO2 22  --   GLUCOSE 135*  --   BUN 11  --   CREATININE 1.10  --   CALCIUM 8.6*  --    GFR: Estimated Creatinine Clearance: 66.5 mL/min (by C-G formula based on SCr of 1.1 mg/dL). Liver Function Tests: Recent Labs  Lab 04/25/21 0001  AST 24  ALT 24  ALKPHOS 76  BILITOT 0.6  PROT 6.7  ALBUMIN 3.5   No results for input(s): LIPASE, AMYLASE in the last 168 hours. No results for input(s): AMMONIA in the last 168 hours. Coagulation Profile: Recent Labs  Lab 04/25/21  0001  INR 1.0   Cardiac Enzymes: No results for input(s): CKTOTAL, CKMB, CKMBINDEX, TROPONINI in the last 168 hours. BNP (last 3 results) No results for input(s): PROBNP in the last 8760 hours. HbA1C: No results for input(s): HGBA1C in the last 72 hours. CBG: No results for input(s): GLUCAP in the last 168 hours. Lipid Profile: No results for input(s): CHOL, HDL, LDLCALC, TRIG, CHOLHDL, LDLDIRECT in the last 72 hours. Thyroid Function Tests: No results for input(s): TSH, T4TOTAL, FREET4, T3FREE, THYROIDAB in the last 72 hours. Anemia Panel: No results for input(s): VITAMINB12, FOLATE, FERRITIN, TIBC, IRON, RETICCTPCT in the last 72 hours. Urine analysis:    Component Value Date/Time   COLORURINE YELLOW 02/24/2019 1429   APPEARANCEUR CLEAR 02/24/2019 1429   LABSPEC 1.010 02/24/2019 1429   PHURINE 7.0 02/24/2019 1429   GLUCOSEU NEGATIVE 02/24/2019 1429   HGBUR NEGATIVE 02/24/2019 1429   BILIRUBINUR NEGATIVE 02/24/2019 1429   KETONESUR NEGATIVE 02/24/2019 1429   UROBILINOGEN 0.2 02/24/2019 1429   NITRITE NEGATIVE 02/24/2019 1429   LEUKOCYTESUR NEGATIVE 02/24/2019 1429   Sepsis Labs: _0 (procalcitonin:4,lacticidven:4) ) Recent Results (from the past 240 hour(s))  Resp Panel by RT-PCR (Flu A&B, Covid) Nasopharyngeal Swab     Status: None   Collection Time: 04/25/21 12:01 AM   Specimen: Nasopharyngeal Swab; Nasopharyngeal(NP)  swabs in vial transport medium  Result Value Ref Range Status   SARS Coronavirus 2 by RT PCR NEGATIVE NEGATIVE Final    Comment: (NOTE) SARS-CoV-2 target nucleic acids are NOT DETECTED.  The SARS-CoV-2 RNA is generally detectable in upper respiratory specimens during the acute phase of infection. The lowest concentration of SARS-CoV-2 viral copies this assay can detect is 138 copies/mL. A negative result does not preclude SARS-Cov-2 infection and should not be used as the sole basis for treatment or other patient management decisions. A negative result may occur with  improper specimen collection/handling, submission of specimen other than nasopharyngeal swab, presence of viral mutation(s) within the areas targeted by this assay, and inadequate number of viral copies(<138 copies/mL). A negative result must be combined with clinical observations, patient history, and epidemiological information. The expected result is Negative.  Fact Sheet for Patients:  EntrepreneurPulse.com.au  Fact Sheet for Healthcare Providers:  IncredibleEmployment.be  This test is no t yet approved or cleared by the [place] FDA and  has been authorized for detection and/or diagnosis of SARS-CoV-2 by FDA under an Emergency Use Authorization (EUA). This EUA will remain  in effect (meaning this test can be used) for the duration of the COVID-19 declaration under Section 564(b)(1) of the Act, 21 U.S.C.section 360bbb-3(b)(1), unless the authorization is terminated  or revoked sooner.       Influenza A by PCR NEGATIVE NEGATIVE Final   Influenza B by PCR NEGATIVE NEGATIVE Final    Comment: (NOTE) The Xpert Xpress SARS-CoV-2/FLU/RSV plus assay is intended as an aid in the diagnosis of influenza from Nasopharyngeal swab specimens and should not be used as a sole basis for treatment. Nasal washings and aspirates are unacceptable for Xpert Xpress  SARS-CoV-2/FLU/RSV testing.  Fact Sheet for Patients: EntrepreneurPulse.com.au  Fact Sheet for Healthcare Providers: IncredibleEmployment.be  This test is not yet approved or cleared by the [place] FDA and has been authorized for detection and/or diagnosis of SARS-CoV-2 by FDA under an Emergency Use Authorization (EUA). This EUA will remain in effect (meaning this test can be used) for the duration of the COVID-19 declaration under Section 564(b)(1) of the Act, 21 U.S.C. section 360bbb-3(b)(1), unless  the authorization is terminated or revoked.  Performed at [facility], [place] [address], [place], [place] [address]      Radiological Exams on Admission: CT Angio Chest PE W/Cm &/Or Wo Cm  Result Date: 04/25/2021 CLINICAL DATA:  Concern for pulmonary embolism. EXAM: CT ANGIOGRAPHY CHEST WITH CONTRAST TECHNIQUE: Multidetector CT imaging of the chest was performed using the standard protocol during bolus administration of intravenous contrast. Multiplanar CT image reconstructions and MIPs were obtained to evaluate the vascular anatomy. CONTRAST:  24m OMNIPAQUE IOHEXOL 350 MG/ML SOLN COMPARISON:  Chest CT dated 05/03/2020. FINDINGS: Cardiovascular: No cardiomegaly or pericardial effusion. The thoracic aorta is unremarkable. The origins of the great vessels of the aortic arch appear patent. No pulmonary artery embolus identified. Mediastinum/Nodes: Mildly enlarged bilateral hilar and mediastinal lymph nodes measuring 13 mm in short axis the subcarinal region and 15 mm in the right hilum. The esophagus is grossly unremarkable. No mediastinal fluid collection. Lungs/Pleura: Background of centrilobular emphysema. Scattered bilateral pulmonary nodules with tree-in-bud appearance most consistent with an inflammatory/infectious etiology, likely atypical or viral in nature. No pleural effusion pneumothorax. There is thickening of the bronchial walls. The  central airways remain patent. Upper Abdomen: No acute abnormality. Musculoskeletal: No acute osseous pathology. Review of the MIP images confirms the above findings. IMPRESSION: 1. No CT evidence of pulmonary embolism. 2. Scattered bilateral pulmonary nodules with tree-in-bud appearance most consistent with an inflammatory/infectious etiology, likely atypical or viral in nature. 3. Mildly enlarged bilateral hilar and mediastinal lymph nodes, likely reactive. 4. Emphysema (ICD10-J43.9). Electronically Signed   By: AAnner CreteM.D.   On: 04/25/2021 02:39   DG Chest Portable 1 View  Result Date: 04/25/2021 CLINICAL DATA:  Shortness of breath and chest pain. EXAM: PORTABLE CHEST 1 VIEW COMPARISON:  Chest radiograph dated 12/19/2017. chest CT dated 05/03/2020. FINDINGS: Background of emphysema. No focal consolidation, pleural effusion, or pneumothorax. The cardiac silhouette is within normal limits. No acute osseous pathology. IMPRESSION: No active disease. Electronically Signed   By: AAnner CreteM.D.   On: 04/25/2021 01:18    EKG: Independently reviewed.  Sinus tachycardia.  Assessment/Plan Principal Problem:   [place]is ([place] Active Problems:   Factor 5 Leiden mutation, heterozygous ([place]   Chronic lumbar radiculopathy   COPD exacerbation ([place]   CAP (community acquired pneumonia)    Sepsis likely from pneumonia on admission patient was  febrile tachycardic with leukocytosis and CT scan showing pneumonia meets with sepsis criteria.  I have placed patient on empiric antibiotics for community-acquired pneumonia.  Follow cultures hydration and closely monitor for any worsening pain may require repeat x-ray to make sure there is no pleural effusion developing. COPD exacerbation on exam patient does have wheezing.  We will place on Pulmicort nebulizer and if wheezing worsens may need steroids. Chest pain pleuritic in nature I will since patient has risk factor of smoking we will trend cardiac  markers.  On aspirin. Hypertension on amlodipine. Anemia appears to be new.  We will check anemia panel follow CBC. Hyponatremia cause not clear follow metabolic panel closely.  Check urine studies. Tobacco abuse advised about quitting.   DVT prophylaxis: Lovenox. Code Status: Full code. Family Communication: Discussed with patient. Disposition Plan: Home. Consults called: None. Admission status: Observation.   ARise PatienceMD [facility] Pager [phone]  If 7PM-7AM, please contact night-coverage [email or web address] Password [email or web address] 04/25/2021, 6:16 AM

## 2021-04-25 NOTE — Sepsis Progress Note (Signed)
Elink monitoring for Sepsis Protocol 

## 2021-04-25 NOTE — ED Notes (Signed)
Pt is aware UA needed. Pt states he has not slept all night and is exhausted.

## 2021-04-25 NOTE — ED Notes (Signed)
Called 3E to initiate purple man process since bed has been assigned and ready for 22 minutes.

## 2021-04-25 NOTE — ED Provider Notes (Signed)
Emergency Medicine Provider Triage Evaluation Note  Cody Grant , a 60 y.o. male  was evaluated in triage.  Pt complains of left lower chest pain and shortness of breath.  Patient has history of COPD (chronic bronchitis).  He started having increased trouble breathing yesterday and is worsened today, and the nurse afternoon he noticed the left lower chest pain he states feels like "rib pain" but only hurts when breathing not on palpation.  Michela Pitcher he took his heart rate which was very high decided to the ED.  He has had numerous admissions for COPD exacerbation, states this feels like another 1..  Review of Systems  Positive: Severe chest pain, shortness of breath, cough, fever Negative: Abdominal pain, nausea, vomiting, diarrhea  Physical Exam  BP (!) 142/118    Pulse (!) 124    Temp (!) 101.4 F (38.6 C) (Oral)    Resp (!) 22    Ht _0  (1.727 m)    Wt 65.8 kg    SpO2 92%    BMI 22.05 kg/m  Gen:   Awake, short of breath while talking Resp:  Increased work of breathing, tachypnea; patient is able to speak in complete sentences but seems to be short of breath while talking; wheezing of the right lung, diminished breath sounds of the left lung MSK:   Moves extremities without difficulty  Other:  No chest wall pain on palpation.  Medical Decision Making  Medically screening exam initiated at 12:02 AM.  Appropriate orders placed.  Prudencio Velazco was informed that the remainder of the evaluation will be completed by another provider, this initial triage assessment does not replace that evaluation, and the importance of remaining in the ED until their evaluation is complete.  Patient met sepsis criteria upon admission with temperature 101.4, pulse 124 and respiratory of 22.  His oxygen was 92%.  Sepsis protocol initiated   Rodena Piety 04/25/21 0005    Teressa Lower, MD 04/25/21 [phone]

## 2021-04-26 DIAGNOSIS — J9601 Acute respiratory failure with hypoxia: Secondary | ICD-10-CM

## 2021-04-26 DIAGNOSIS — D6851 Activated protein C resistance: Secondary | ICD-10-CM

## 2021-04-26 DIAGNOSIS — A419 Sepsis, unspecified organism: Secondary | ICD-10-CM | POA: Diagnosis not present

## 2021-04-26 DIAGNOSIS — J441 Chronic obstructive pulmonary disease with (acute) exacerbation: Secondary | ICD-10-CM

## 2021-04-26 DIAGNOSIS — J189 Pneumonia, unspecified organism: Secondary | ICD-10-CM | POA: Diagnosis not present

## 2021-04-26 LAB — BASIC METABOLIC PANEL
Anion gap: 8 (ref 5–15)
BUN: 13 mg/dL (ref 6–20)
CO2: 23 mmol/L (ref 22–32)
Calcium: 9.1 mg/dL (ref 8.9–10.3)
Chloride: 107 mmol/L (ref 98–111)
Creatinine, Ser: 0.9 mg/dL (ref 0.61–1.24)
GFR, Estimated: >60 mL/min (ref >60)
Glucose, Bld: 163 mg/dL — ABNORMAL HIGH (ref 70–99)
Potassium: 3.9 mmol/L (ref 3.5–5.1)
Sodium: 138 mmol/L (ref 135–145)

## 2021-04-26 LAB — CBC
HCT: 33.7 % — ABNORMAL LOW (ref 39.0–52.0)
Hemoglobin: 11.6 g/dL — ABNORMAL LOW (ref 13.0–17.0)
MCH: 31.3 pg (ref 26.0–34.0)
MCHC: 34.4 g/dL (ref 30.0–36.0)
MCV: 90.8 fL (ref 80.0–100.0)
Platelets: 231 10*3/uL (ref 150–400)
RBC: 3.71 MIL/uL — ABNORMAL LOW (ref 4.22–5.81)
RDW: 12.7 % (ref 11.5–15.5)
WBC: 20.2 10*3/uL — ABNORMAL HIGH (ref 4.0–10.5)
nRBC: 0 % (ref 0.0–0.2)

## 2021-04-26 LAB — URINE CULTURE: Culture: NO GROWTH

## 2021-04-26 LAB — HEMOGLOBIN A1C
Hgb A1c MFr Bld: 5.5 % (ref 4.8–5.6)
Mean Plasma Glucose: 111.15 mg/dL

## 2021-04-26 LAB — LEGIONELLA PNEUMOPHILA SEROGP 1 UR AG: L. pneumophila Serogp 1 Ur Ag: NEGATIVE

## 2021-04-26 MED ORDER — ZOLPIDEM TARTRATE 5 MG PO TABS
10.0000 mg | ORAL_TABLET | Freq: Every evening | ORAL | Status: DC | PRN
  Administered 2021-04-26: 21:00:00 10 mg via ORAL
  Filled 2021-04-26: qty 2

## 2021-04-26 MED ORDER — PREDNISONE 50 MG PO TABS
50.0000 mg | ORAL_TABLET | Freq: Every day | ORAL | Status: DC
  Administered 2021-04-27: 09:00:00 50 mg via ORAL
  Filled 2021-04-26: qty 1

## 2021-04-26 MED ORDER — METHYLPREDNISOLONE SODIUM SUCC 40 MG IJ SOLR
40.0000 mg | Freq: Two times a day (BID) | INTRAMUSCULAR | Status: AC
  Administered 2021-04-26 (×2): 40 mg via INTRAVENOUS
  Filled 2021-04-26 (×2): qty 1

## 2021-04-26 MED ORDER — IPRATROPIUM-ALBUTEROL 0.5-2.5 (3) MG/3ML IN SOLN
3.0000 mL | Freq: Three times a day (TID) | RESPIRATORY_TRACT | Status: DC
Start: 2021-04-26 — End: 2021-04-28
  Administered 2021-04-26 – 2021-04-27 (×3): 3 mL via RESPIRATORY_TRACT
  Filled 2021-04-26 (×3): qty 3

## 2021-04-26 NOTE — Progress Notes (Signed)
PROGRESS NOTE    Cody Grant  LKG:[id number] DOB: February 18, 1961 DOA: 04/24/2021 PCP: Biagio Borg, MD   Brief Narrative:  This is 60 year old gentleman with a history of COPD and ongoing tobacco abuse who presented to ED with chest pain and shortness of breath and was admitted with sepsis secondary to pneumonia as well as acute COPD exacerbation.   Assessment & Plan:   Principal Problem:   Sepsis ([place]) Active Problems:   Factor 5 Leiden mutation, heterozygous ([place])   Chronic lumbar radiculopathy   COPD exacerbation ([place])   CAP (community acquired pneumonia)   Acute respiratory failure with hypoxia ([place])  Sepsis and acute hypoxic respiratory failure secondary to community-acquired pneumonia and acute COPD exacerbation/ongoing tobacco abuse: He states that he feels better than yesterday, he appears better than yesterday as well.  He still has diffuse expiratory wheezes bilaterally, he was worse yesterday.  He received high-dose of IV Solu-Medrol x2 yesterday.  I have placed him on Solu-Medrol IV 40 mg x 2 today and starting tomorrow, I already placed him on oral prednisone, will continue bronchodilators now as well as antibiotics.  Counseling provided for smoking cessation.  Hypertension: Controlled.  Continue amlodipine  Insomnia: Ambien nightly as needed.  DVT prophylaxis: enoxaparin (LOVENOX) injection 40 mg Start: 04/25/21 1000   Code Status: Full Code  Family Communication: Wife present at bedside.  Plan of care discussed with patient in length and he verbalized understanding and agreed with it.  Status is: Inpatient  Remains inpatient appropriate because: Still needs IV steroids, very wheezy, potential discharge tomorrow  Estimated body mass index is 22.05 kg/m as calculated from the following:   Height as of this encounter: 5\' 8"  (1.727 m).   Weight as of this encounter: 65.8 kg.     Nutritional Assessment: Body mass index is 22.05 kg/m.Marland Kitchen Seen by dietician.  I  agree with the assessment and plan as outlined below: Nutrition Status:   Skin Assessment: I have examined the patient's skin and I agree with the wound assessment as performed by the wound care RN as outlined below:    Consultants:  None  Procedures:  None  Antimicrobials:  Anti-infectives (From admission, onward)    Start     Dose/Rate Route Frequency Ordered Stop   04/25/21 0800  cefTRIAXone (ROCEPHIN) 2 g in sodium chloride 0.9 % 100 mL IVPB        2 g 200 mL/hr over 30 Minutes Intravenous Every 24 hours 04/25/21 0612 04/30/21 0759   04/25/21 0615  azithromycin (ZITHROMAX) 500 mg in sodium chloride 0.9 % 250 mL IVPB        500 mg 250 mL/hr over 60 Minutes Intravenous Every 24 hours 04/25/21 0612 04/30/21 0614   04/25/21 0015  ceFEPIme (MAXIPIME) 2 g in sodium chloride 0.9 % 100 mL IVPB        2 g 200 mL/hr over 30 Minutes Intravenous  Once 04/25/21 0001 04/25/21 0132   04/25/21 0015  metroNIDAZOLE (FLAGYL) IVPB 500 mg        500 mg 100 mL/hr over 60 Minutes Intravenous  Once 04/25/21 0001 04/25/21 0234   04/25/21 0015  vancomycin (VANCOREADY) IVPB 1250 mg/250 mL        1,250 mg 166.7 mL/hr over 90 Minutes Intravenous  Once 04/25/21 0001 04/25/21 0504          Subjective: Seen and examined.  His wife at the bedside.  He states that his breathing is better than yesterday however he continues to  complain that the bed is not comfortable, he has not slept well, he is being bothered by the nurses every 2 hours to check his vitals, he does not want to be hooked up to the monitor since he wants to be mobile and walk.  I told him that he can walk and be off the monitor for an hour but he will need to be back hooked up once he is back.  Also, he is upset that he has to eat heart healthy diet and he does not like the options available.  I told him that I highly recommend sticking to a heart healthy diet.  Nurses tell me that his wife brought food from outside for him.  Per nurses, he  is just being upset on everything and does not want to be compliant.  He also told his wife to bring his NyQuil.  I advised the patient not to use any medications from outside without our knowledge.  I offered him Ambien for insomnia overnight.  Objective: Vitals:   04/26/21 0533 04/26/21 0747 04/26/21 0749 04/26/21 1111  BP: 118/64 130/74  137/72  Pulse: 78 72  87  Resp: (!) 22 18  (!) 22  Temp: 97.7 F (36.5 C) 98 F (36.7 C)  97.9 F (36.6 C)  TempSrc: Oral Oral    SpO2: 94% 95% 95% 97%  Weight:      Height:        Intake/Output Summary (Last 24 hours) at 04/26/2021 1150 Last data filed at 04/26/2021 0820 Gross per 24 hour  Intake 720 ml  Output 1000 ml  Net -280 ml   Filed Weights   04/24/21 2326  Weight: 65.8 kg    Examination:  General exam: Appears comfortable  Respiratory system: Diffuse expiratory wheezes bilaterally with rhonchi. Respiratory effort normal. Cardiovascular system: S1 & S2 heard, RRR. No JVD, murmurs, rubs, gallops or clicks. No pedal edema. Gastrointestinal system: Abdomen is nondistended, soft and nontender. No organomegaly or masses felt. Normal bowel sounds heard. Central nervous system: Alert and oriented. No focal neurological deficits. Extremities: Symmetric 5 x 5 power. Skin: No rashes, lesions or ulcers Psychiatry: Judgement and insight appear normal. Mood & affect appropriate.    Data Reviewed: I have personally reviewed following labs and imaging studies  CBC: Recent Labs  Lab 04/25/21 0001 04/25/21 0046 04/25/21 0643 04/26/21 0413  WBC 24.8*  --  17.2* 20.2*  NEUTROABS 20.6*  --  16.2*  --   HGB 12.5* 12.9* 11.2* 11.6*  HCT 37.7* 38.0* 35.3* 33.7*  MCV 93.5  --  96.2 90.8  PLT 232  --  179 161   Basic Metabolic Panel: Recent Labs  Lab 04/25/21 0001 04/25/21 0046 04/25/21 0643 04/26/21 0413  NA 129* 131* 133* 138  K 3.7 3.7 3.4* 3.9  CL 98  --  101 107  CO2 22  --  20* 23  GLUCOSE 135*  --  200* 163*  BUN 11  --   10 13  CREATININE 1.10  --  1.06 0.90  CALCIUM 8.6*  --  8.2* 9.1   GFR: Estimated Creatinine Clearance: 81.2 mL/min (by C-G formula based on SCr of 0.9 mg/dL). Liver Function Tests: Recent Labs  Lab 04/25/21 0001  AST 24  ALT 24  ALKPHOS 76  BILITOT 0.6  PROT 6.7  ALBUMIN 3.5   No results for input(s): LIPASE, AMYLASE in the last 168 hours. No results for input(s): AMMONIA in the last 168 hours. Coagulation Profile: Recent Labs  Lab 04/25/21 0001  INR 1.0   Cardiac Enzymes: No results for input(s): CKTOTAL, CKMB, CKMBINDEX, TROPONINI in the last 168 hours. BNP (last 3 results) No results for input(s): PROBNP in the last 8760 hours. HbA1C: Recent Labs    04/26/21 0413  HGBA1C 5.5   CBG: No results for input(s): GLUCAP in the last 168 hours. Lipid Profile: No results for input(s): CHOL, HDL, LDLCALC, TRIG, CHOLHDL, LDLDIRECT in the last 72 hours. Thyroid Function Tests: No results for input(s): TSH, T4TOTAL, FREET4, T3FREE, THYROIDAB in the last 72 hours. Anemia Panel: Recent Labs    04/25/21 0643 04/25/21 0646 04/25/21 0647  VITAMINB12 164*  --   --   FOLATE  --  11.1  --   FERRITIN 268  --   --   TIBC NOT CALCULATED  --   --   IRON QUANTITY NOT SUFFICIENT, UNABLE TO PERFORM TEST  --   --   RETICCTPCT  --   --  1.0   Sepsis Labs: Recent Labs  Lab 04/25/21 0001 04/25/21 0149 04/25/21 0643  PROCALCITON  --   --  0.33  LATICACIDVEN 1.2 1.1  --     Recent Results (from the past 240 hour(s))  Resp Panel by RT-PCR (Flu A&B, Covid) Nasopharyngeal Swab     Status: None   Collection Time: 04/25/21 12:01 AM   Specimen: Nasopharyngeal Swab; Nasopharyngeal(NP) swabs in vial transport medium  Result Value Ref Range Status   SARS Coronavirus 2 by RT PCR NEGATIVE NEGATIVE Final    Comment: (NOTE) SARS-CoV-2 target nucleic acids are NOT DETECTED.  The SARS-CoV-2 RNA is generally detectable in upper respiratory specimens during the acute phase of infection.  The lowest concentration of SARS-CoV-2 viral copies this assay can detect is 138 copies/mL. A negative result does not preclude SARS-Cov-2 infection and should not be used as the sole basis for treatment or other patient management decisions. A negative result may occur with  improper specimen collection/handling, submission of specimen other than nasopharyngeal swab, presence of viral mutation(s) within the areas targeted by this assay, and inadequate number of viral copies(<138 copies/mL). A negative result must be combined with clinical observations, patient history, and epidemiological information. The expected result is Negative.  Fact Sheet for Patients:  EntrepreneurPulse.com.au  Fact Sheet for Healthcare Providers:  IncredibleEmployment.be  This test is no t yet approved or cleared by the [place] FDA and  has been authorized for detection and/or diagnosis of SARS-CoV-2 by FDA under an Emergency Use Authorization (EUA). This EUA will remain  in effect (meaning this test can be used) for the duration of the COVID-19 declaration under Section 564(b)(1) of the Act, 21 U.S.C.section 360bbb-3(b)(1), unless the authorization is terminated  or revoked sooner.       Influenza A by PCR NEGATIVE NEGATIVE Final   Influenza B by PCR NEGATIVE NEGATIVE Final    Comment: (NOTE) The Xpert Xpress SARS-CoV-2/FLU/RSV plus assay is intended as an aid in the diagnosis of influenza from Nasopharyngeal swab specimens and should not be used as a sole basis for treatment. Nasal washings and aspirates are unacceptable for Xpert Xpress SARS-CoV-2/FLU/RSV testing.  Fact Sheet for Patients: EntrepreneurPulse.com.au  Fact Sheet for Healthcare Providers: IncredibleEmployment.be  This test is not yet approved or cleared by the [place] FDA and has been authorized for detection and/or diagnosis of SARS-CoV-2 by FDA  under an Emergency Use Authorization (EUA). This EUA will remain in effect (meaning this test can be used) for the duration of  the COVID-19 declaration under Section 564(b)(1) of the Act, 21 U.S.C. section 360bbb-3(b)(1), unless the authorization is terminated or revoked.  Performed at [facility], [place] [address] [place], [address] [address]   Blood Culture (routine x 2)     Status: None (Preliminary result)   Collection Time: 04/25/21 12:04 AM   Specimen: BLOOD  Result Value Ref Range Status   Specimen Description BLOOD RIGHT ANTECUBITAL  Final   Special Requests   Final    BOTTLES DRAWN AEROBIC AND ANAEROBIC Blood Culture adequate volume   Culture   Final    NO GROWTH 1 DAY Performed at [facility], [place] [address], [place], [place] [address]    Report Status PENDING  Incomplete  Urine Culture     Status: None   Collection Time: 04/25/21 10:28 AM   Specimen: In/Out Cath Urine  Result Value Ref Range Status   Specimen Description IN/OUT CATH URINE  Final   Special Requests NONE  Final   Culture   Final    NO GROWTH Performed at [facility], [place] [address] [place],  [address]    Report Status 04/26/2021 FINAL  Final      Radiology Studies: CT Angio Chest PE W/Cm &/Or Wo Cm  Result Date: 04/25/2021 CLINICAL DATA:  Concern for pulmonary embolism. EXAM: CT ANGIOGRAPHY CHEST WITH CONTRAST TECHNIQUE: Multidetector CT imaging of the chest was performed using the standard protocol during bolus administration of intravenous contrast. Multiplanar CT image reconstructions and MIPs were obtained to evaluate the vascular anatomy. CONTRAST:  63mL OMNIPAQUE IOHEXOL 350 MG/ML SOLN COMPARISON:  Chest CT dated 05/03/2020. FINDINGS: Cardiovascular: No cardiomegaly or pericardial effusion. The thoracic aorta is unremarkable. The origins of the great vessels of the aortic arch appear patent. No pulmonary artery embolus identified. Mediastinum/Nodes: Mildly enlarged  bilateral hilar and mediastinal lymph nodes measuring 13 mm in short axis the subcarinal region and 15 mm in the right hilum. The esophagus is grossly unremarkable. No mediastinal fluid collection. Lungs/Pleura: Background of centrilobular emphysema. Scattered bilateral pulmonary nodules with tree-in-bud appearance most consistent with an inflammatory/infectious etiology, likely atypical or viral in nature. No pleural effusion pneumothorax. There is thickening of the bronchial walls. The central airways remain patent. Upper Abdomen: No acute abnormality. Musculoskeletal: No acute osseous pathology. Review of the MIP images confirms the above findings. IMPRESSION: 1. No CT evidence of pulmonary embolism. 2. Scattered bilateral pulmonary nodules with tree-in-bud appearance most consistent with an inflammatory/infectious etiology, likely atypical or viral in nature. 3. Mildly enlarged bilateral hilar and mediastinal lymph nodes, likely reactive. 4. Emphysema (ICD10-J43.9). Electronically Signed   By: Anner Crete M.D.   On: 04/25/2021 02:39   DG Chest Portable 1 View  Result Date: 04/25/2021 CLINICAL DATA:  Shortness of breath and chest pain. EXAM: PORTABLE CHEST 1 VIEW COMPARISON:  Chest radiograph dated 12/19/2017. chest CT dated 05/03/2020. FINDINGS: Background of emphysema. No focal consolidation, pleural effusion, or pneumothorax. The cardiac silhouette is within normal limits. No acute osseous pathology. IMPRESSION: No active disease. Electronically Signed   By: Anner Crete M.D.   On: 04/25/2021 01:18    Scheduled Meds:  amLODipine  10 mg Oral QHS   arformoterol  15 mcg Nebulization BID   And   umeclidinium bromide  1 puff Inhalation Daily   aspirin EC  81 mg Oral Daily   enoxaparin (LOVENOX) injection  40 mg Subcutaneous Q24H   ipratropium-albuterol  3 mL Nebulization Q6H   methylPREDNISolone (SOLU-MEDROL)  injection  40 mg Intravenous Q12H   [START ON 04/27/2021] predniSONE  50 mg Oral Q  breakfast   Continuous Infusions:  azithromycin 500 mg (04/26/21 0423)   cefTRIAXone (ROCEPHIN)  IV Stopped (04/25/21 0818)     LOS: 1 day   Time spent: 32 minutes   Darliss Cheney, MD [facility]  04/26/2021, 11:50 AM  Please page via [place] [place] and do not message via secure chat for anything urgent. Secure chat can be used for anything non urgent.  How to contact the [facility] Attending or Consulting provider [place] or covering provider during after hours [place], for this patient?  Check the care team in [facility] and look for a) attending/consulting TR[facility] provider listed and b) the [facility] team listed. Page or secure chat [phone]. Log into [email or web address] and use [place]'s universal password to access. If you do not have the password, please contact the hospital operator. Locate the [facility] provider you are looking for under [facility] and page to a number that you can be directly reached. If you still have difficulty reaching the provider, please page the [facility] (Director on Call) for the Hospitalists listed on amion for assistance.

## 2021-04-26 NOTE — TOC Progression Note (Signed)
Transition of Care [facility]) - Progression Note    Patient Details  Name: Holt Woolbright MRN: [id number] Date of Birth: 1961-01-17  Transition of Care [facility]) CM/SW Contact  Zenon Mayo, RN Phone Number: 04/26/2021, 9:14 AM  Clinical Narrative:     Transition of Care [facility]) Screening Note   Patient Details  Name: Kervens Roper Date of Birth: 06-28-1960   Transition of Care [facility]) CM/SW Contact:    Zenon Mayo, RN Phone Number: 04/26/2021, 9:14 AM    Transition of Care Department [facility]) has reviewed patient and no TOC needs have been identified at this time. We will continue to monitor patient advancement through interdisciplinary progression rounds. If new patient transition needs arise, please place a TOC consult.          Expected Discharge Plan and Services                                                 Social Determinants of Health (SDOH) Interventions    Readmission Risk Interventions No flowsheet data found.

## 2021-04-26 NOTE — Plan of Care (Signed)

## 2021-04-27 DIAGNOSIS — A419 Sepsis, unspecified organism: Secondary | ICD-10-CM | POA: Diagnosis not present

## 2021-04-27 DIAGNOSIS — J189 Pneumonia, unspecified organism: Secondary | ICD-10-CM | POA: Diagnosis not present

## 2021-04-27 DIAGNOSIS — R6520 Severe sepsis without septic shock: Secondary | ICD-10-CM

## 2021-04-27 DIAGNOSIS — J9601 Acute respiratory failure with hypoxia: Secondary | ICD-10-CM | POA: Diagnosis not present

## 2021-04-27 DIAGNOSIS — J441 Chronic obstructive pulmonary disease with (acute) exacerbation: Secondary | ICD-10-CM | POA: Diagnosis not present

## 2021-04-27 LAB — CBC
HCT: 33.2 % — ABNORMAL LOW (ref 39.0–52.0)
Hemoglobin: 11.4 g/dL — ABNORMAL LOW (ref 13.0–17.0)
MCH: 31.4 pg (ref 26.0–34.0)
MCHC: 34.3 g/dL (ref 30.0–36.0)
MCV: 91.5 fL (ref 80.0–100.0)
Platelets: 260 K/uL (ref 150–400)
RBC: 3.63 MIL/uL — ABNORMAL LOW (ref 4.22–5.81)
RDW: 12.8 % (ref 11.5–15.5)
WBC: 16.8 K/uL — ABNORMAL HIGH (ref 4.0–10.5)
nRBC: 0 % (ref 0.0–0.2)

## 2021-04-27 MED ORDER — AZITHROMYCIN 250 MG PO TABS: 250.0000 mg | ORAL_TABLET | Freq: Every day | ORAL | Status: DC

## 2021-04-27 MED ORDER — ZOLPIDEM TARTRATE 10 MG PO TABS
10.0000 mg | ORAL_TABLET | Freq: Every evening | ORAL | 0 refills | Status: DC | PRN
Start: 2021-04-27 — End: 2021-06-01

## 2021-04-27 MED ORDER — IPRATROPIUM-ALBUTEROL 0.5-2.5 (3) MG/3ML IN SOLN: 3.0000 mL | Freq: Four times a day (QID) | RESPIRATORY_TRACT | 0 refills | Status: DC | PRN

## 2021-04-27 MED ORDER — PREDNISONE 50 MG PO TABS: 50.0000 mg | ORAL_TABLET | Freq: Every day | ORAL | 0 refills | Status: DC

## 2021-04-27 MED ORDER — CYANOCOBALAMIN 1000 MCG/ML IJ SOLN
1000.0000 ug | Freq: Once | INTRAMUSCULAR | Status: AC
Start: 2021-04-27 — End: 2021-04-27
  Administered 2021-04-27: 14:00:00 1000 ug via SUBCUTANEOUS
  Filled 2021-04-27 (×2): qty 1

## 2021-04-27 NOTE — Plan of Care (Signed)

## 2021-04-27 NOTE — Plan of Care (Signed)
Problem: Education: Goal: Knowledge of disease or condition will improve 04/27/2021 1528 by Serafina Mitchell, RN Outcome: Completed/Met 04/27/2021 1436 by Serafina Mitchell, RN Outcome: Progressing Goal: Knowledge of the prescribed therapeutic regimen will improve 04/27/2021 1528 by Serafina Mitchell, RN Outcome: Completed/Met 04/27/2021 1436 by Serafina Mitchell, RN Outcome: Progressing Goal: Individualized Educational Video(s) 04/27/2021 1528 by Serafina Mitchell, RN Outcome: Completed/Met 04/27/2021 1436 by Serafina Mitchell, RN Outcome: Progressing   Problem: Activity: Goal: Ability to tolerate increased activity will improve 04/27/2021 1528 by Serafina Mitchell, RN Outcome: Completed/Met 04/27/2021 1436 by Serafina Mitchell, RN Outcome: Progressing Goal: Will verbalize the importance of balancing activity with adequate rest periods 04/27/2021 1528 by Serafina Mitchell, RN Outcome: Completed/Met 04/27/2021 1436 by Serafina Mitchell, RN Outcome: Progressing   Problem: Respiratory: Goal: Ability to maintain a clear airway will improve 04/27/2021 1528 by Serafina Mitchell, RN Outcome: Completed/Met 04/27/2021 1436 by Serafina Mitchell, RN Outcome: Progressing Goal: Levels of oxygenation will improve 04/27/2021 1528 by Serafina Mitchell, RN Outcome: Completed/Met 04/27/2021 1436 by Serafina Mitchell, RN Outcome: Progressing Goal: Ability to maintain adequate ventilation will improve 04/27/2021 1528 by Serafina Mitchell, RN Outcome: Completed/Met 04/27/2021 1436 by Serafina Mitchell, RN Outcome: Progressing   Problem: Respiratory: Goal: Ability to maintain adequate oxygenation and ventilation will improve by discharge 04/27/2021 1528 by Serafina Mitchell, RN Outcome: Completed/Met 04/27/2021 1436 by Serafina Mitchell, RN Outcome: Progressing   Problem: Role Relationship: Goal: Ability to identify and utilize available support  systems will improve by discharge 04/27/2021 1528 by Serafina Mitchell, RN Outcome: Completed/Met 04/27/2021 1436 by Serafina Mitchell, RN Outcome: Progressing   Problem: Pain Management: Goal: Satisfaction with pain management regimen will be met by discharge 04/27/2021 1528 by Serafina Mitchell, RN Outcome: Completed/Met 04/27/2021 1436 by Serafina Mitchell, RN Outcome: Progressing

## 2021-04-27 NOTE — Discharge Summary (Signed)
DISCHARGE SUMMARY  Cody Grant  MR#: [id number]  DOB:[birth year]  Date of Admission: 04/24/2021 Date of Discharge: 04/27/2021  Attending Physician:Locklyn Henriquez Hennie Duos, MD  Patient's YQM:VHQI, Hunt Oris, MD  Consults: None  Disposition: Discharge home   Follow-up Appts:  Follow-up Information     Biagio Borg, MD Follow up in 1 week(s).   Specialties: Internal Medicine, Radiology Contact information: [place] [address] [phone]                 Discharge Diagnoses: Sepsis Acute hypoxic respiratory failure Community-acquired pneumonia Acute bronchospastic COPD exacerbation Tobacco abuse Hypertension Insomnia  Initial presentation: 60 year old with a history of COPD and ongoing tobacco abuse who presented to the ED with chest pain and shortness of breath and was ultimately diagnosed with community-acquired pneumonia, sepsis, and an acute bronchospastic COPD exacerbation.  Hospital Course:  Sepsis secondary to community-acquired pneumonia At time of admission patient was febrile, tachycardic, and had a leukocytosis with CT scan suggesting infiltrate consistent with pneumonia and clinical evidence of an acute bacterial infection therefore he met sepsis criteria -with empiric antibiotic therapy and volume resuscitation and clinically stabilized  Acute COPD exacerbation/ongoing tobacco abuse Gradually and consistently improved with inhaled medical therapies as well as antibiotics and supportive care for pneumonia as discussed above -counseled extensively as to the absolute need to discontinue tobacco abuse   Hypertension Controlled - continue amlodipine after d/c home    Insomnia Ambien nightly as needed was tolerated well during hospital stay - provided Rx for 30 day supply to trail at home - advised further refills would have to be obtained from his PCP in f/u if he tolerates this well   Allergies as of 04/27/2021   No Known  Allergies      Medication List     STOP taking these medications    ibuprofen 400 MG tablet Commonly known as: ADVIL       TAKE these medications    acetaminophen 325 MG tablet Commonly known as: TYLENOL Take 650 mg by mouth every 6 (six) hours as needed for headache, fever or moderate pain.   albuterol 108 (90 Base) MCG/ACT inhaler Commonly known as: VENTOLIN HFA Inhale 2 puffs into the lungs every 6 (six) hours as needed for wheezing or shortness of breath. What changed: Another medication with the same name was removed. Continue taking this medication, and follow the directions you see here.   amLODipine 10 MG tablet Commonly known as: NORVASC Take 1 tablet (10 mg total) by mouth daily. What changed: when to take this   atorvastatin 10 MG tablet Commonly known as: LIPITOR Take 1 tablet (10 mg total) by mouth daily.   ipratropium-albuterol 0.5-2.5 (3) MG/3ML Soln Commonly known as: DUONEB Take 3 mLs by nebulization every 6 (six) hours as needed (wheezing or increased shortness of breath).   Narcan 4 MG/0.1ML Liqd nasal spray kit Generic drug: naloxone Place 0.4 mg into the nose as needed (accidental overdose).   Oxycodone HCl 10 MG Tabs Take 10 mg by mouth 3 (three) times daily as needed (pain). What changed: Another medication with the same name was removed. Continue taking this medication, and follow the directions you see here.   predniSONE 10 MG tablet Commonly known as: DELTASONE Take  4 each am x 2 days,   2 each am x 2 days,  1 each am x 2 days and stop   sildenafil 100 MG tablet Commonly known as: Viagra Take 0.5-1 tablets (50-100 mg  total) by mouth daily as needed for erectile dysfunction.   Stiolto Respimat 2.5-2.5 MCG/ACT Aers Generic drug: Tiotropium Bromide-Olodaterol Inhale 2 puffs into the lungs daily. What changed: Another medication with the same name was removed. Continue taking this medication, and follow the directions you see here.    tiZANidine 4 MG tablet Commonly known as: ZANAFLEX TAKE 1 TABLET(4 MG) BY MOUTH EVERY 8 HOURS AS NEEDED FOR MUSCLE SPASMS What changed: See the new instructions.   traZODone 50 MG tablet Commonly known as: DESYREL Take 50 mg by mouth at bedtime as needed for sleep.   Vitamin D (Ergocalciferol) 1.25 MG (50000 UNIT) Caps capsule Commonly known as: DRISDOL Take 50,000 Units by mouth every 14 (fourteen) days.   zolpidem 10 MG tablet Commonly known as: AMBIEN Take 1 tablet (10 mg total) by mouth at bedtime as needed for sleep.        Day of Discharge BP 133/83 (BP Location: Right Arm)    Pulse 83    Temp 97.9 F (36.6 C) (Oral)    Resp 20    Ht _0  (1.727 m)    Wt 68 kg    SpO2 98%    BMI 22.79 kg/m   Physical Exam: General: No acute respiratory distress Lungs: Clear to auscultation bilaterally without wheezes or crackles Cardiovascular: Regular rate and rhythm without murmur gallop or rub normal S1 and S2 Abdomen: Nontender, nondistended, soft, bowel sounds positive, no rebound, no ascites, no appreciable mass Extremities: No significant cyanosis, clubbing, or edema bilateral lower extremities  Basic Metabolic Panel: Recent Labs  Lab 04/25/21 0001 04/25/21 0046 04/25/21 0643 04/26/21 0413  NA 129* 131* 133* 138  K 3.7 3.7 3.4* 3.9  CL 98  --  101 107  CO2 22  --  20* 23  GLUCOSE 135*  --  200* 163*  BUN 11  --  10 13  CREATININE 1.10  --  1.06 0.90  CALCIUM 8.6*  --  8.2* 9.1    Liver Function Tests: Recent Labs  Lab 04/25/21 0001  AST 24  ALT 24  ALKPHOS 76  BILITOT 0.6  PROT 6.7  ALBUMIN 3.5   Coags: Recent Labs  Lab 04/25/21 0001  INR 1.0   CBC: Recent Labs  Lab 04/25/21 0001 04/25/21 0046 04/25/21 0643 04/26/21 0413 04/27/21 0609  WBC 24.8*  --  17.2* 20.2* 16.8*  NEUTROABS 20.6*  --  16.2*  --   --   HGB 12.5* 12.9* 11.2* 11.6* 11.4*  HCT 37.7* 38.0* 35.3* 33.7* 33.2*  MCV 93.5  --  96.2 90.8 91.5  PLT 232  --  179 231 260     Recent Results (from the past 240 hour(s))  Resp Panel by RT-PCR (Flu A&B, Covid) Nasopharyngeal Swab     Status: None   Collection Time: 04/25/21 12:01 AM   Specimen: Nasopharyngeal Swab; Nasopharyngeal(NP) swabs in vial transport medium  Result Value Ref Range Status   SARS Coronavirus 2 by RT PCR NEGATIVE NEGATIVE Final    Comment: (NOTE) SARS-CoV-2 target nucleic acids are NOT DETECTED.  The SARS-CoV-2 RNA is generally detectable in upper respiratory specimens during the acute phase of infection. The lowest concentration of SARS-CoV-2 viral copies this assay can detect is 138 copies/mL. A negative result does not preclude SARS-Cov-2 infection and should not be used as the sole basis for treatment or other patient management decisions. A negative result may occur with  improper specimen collection/handling, submission of specimen other than nasopharyngeal swab, presence  of viral mutation(s) within the areas targeted by this assay, and inadequate number of viral copies(<138 copies/mL). A negative result must be combined with clinical observations, patient history, and epidemiological information. The expected result is Negative.  Fact Sheet for Patients:  EntrepreneurPulse.com.au  Fact Sheet for Healthcare Providers:  IncredibleEmployment.be  This test is no t yet approved or cleared by the [place] FDA and  has been authorized for detection and/or diagnosis of SARS-CoV-2 by FDA under an Emergency Use Authorization (EUA). This EUA will remain  in effect (meaning this test can be used) for the duration of the COVID-19 declaration under Section 564(b)(1) of the Act, 21 U.S.C.section 360bbb-3(b)(1), unless the authorization is terminated  or revoked sooner.       Influenza A by PCR NEGATIVE NEGATIVE Final   Influenza B by PCR NEGATIVE NEGATIVE Final    Comment: (NOTE) The Xpert Xpress SARS-CoV-2/FLU/RSV plus assay is intended as an  aid in the diagnosis of influenza from Nasopharyngeal swab specimens and should not be used as a sole basis for treatment. Nasal washings and aspirates are unacceptable for Xpert Xpress SARS-CoV-2/FLU/RSV testing.  Fact Sheet for Patients: EntrepreneurPulse.com.au  Fact Sheet for Healthcare Providers: IncredibleEmployment.be  This test is not yet approved or cleared by the [place] FDA and has been authorized for detection and/or diagnosis of SARS-CoV-2 by FDA under an Emergency Use Authorization (EUA). This EUA will remain in effect (meaning this test can be used) for the duration of the COVID-19 declaration under Section 564(b)(1) of the Act, 21 U.S.C. section 360bbb-3(b)(1), unless the authorization is terminated or revoked.  Performed at [facility], [place] [address], [place], [place] [address]   Blood Culture (routine x 2)     Status: None (Preliminary result)   Collection Time: 04/25/21 12:04 AM   Specimen: BLOOD  Result Value Ref Range Status   Specimen Description BLOOD RIGHT ANTECUBITAL  Final   Special Requests   Final    BOTTLES DRAWN AEROBIC AND ANAEROBIC Blood Culture adequate volume   Culture   Final    NO GROWTH 2 DAYS Performed at [facility], [place] [address] [place], [place] [address]    Report Status PENDING  Incomplete  Urine Culture     Status: None   Collection Time: 04/25/21 10:28 AM   Specimen: In/Out Cath Urine  Result Value Ref Range Status   Specimen Description IN/OUT CATH URINE  Final   Special Requests NONE  Final   Culture   Final    NO GROWTH Performed at [facility], [place] [address], [place], [place] [address]    Report Status 04/26/2021 FINAL  Final      Time spent in discharge (includes decision making & examination of pt): 35 minutes  04/27/2021, 5:39 PM   Cherene Altes, MD [facility] Office  [phone]

## 2021-04-27 NOTE — Progress Notes (Signed)
Pt has received last breathing tx of duoneb and MD has seen him and given him instructions post discharge. Wife was present for all teaching by MD and nurse. Pt is A & O x 4, hyperactive and ready to discharge out of facility. Cough is present still. Pt denies pain and states he is aware of his needs post discharge.   Plan of care and goals have been met. IV site good,clean and no sign nor symptoms of infiltration nor infection. All belongings returned.

## 2021-04-28 ENCOUNTER — Telehealth: Payer: Self-pay

## 2021-04-28 NOTE — Telephone Encounter (Signed)
Transition Care Management Follow-up Telephone Call Date of discharge and from where: [facility] 04/27/21 Dx: sepsis  How have you been since you were released from the hospital? Feeling better and breathing much better  Any questions or concerns? No  Items Reviewed: Did the pt receive and understand the discharge instructions provided? Yes  Medications obtained and verified? Yes  Other? No  Any new allergies since your discharge? No  Dietary orders reviewed? Yes Do you have support at home? Yes   Home Care and Equipment/Supplies: Were home health services ordered? no   Has the agency set up a time to come to the patient's home? not applicable Were any new equipment or medical supplies ordered?  No What is the name of the medical supply agency? na Were you able to get the supplies/equipment? not applicable Do you have any questions related to the use of the equipment or supplies? No  Functional Questionnaire: (I = Independent and D = Dependent) ADLs: I  Bathing/Dressing- I  Meal Prep- I  Eating- I  Maintaining continence- I  Transferring/Ambulation- I  Managing Meds- I  Follow up appointments reviewed:  PCP Hospital f/u appt confirmed? Yes  Scheduled to see Dr Jenny Reichmann  on 05-06-21 @ [facility] f/u appt confirmed? No . Are transportation arrangements needed? No  If their condition worsens, is the pt aware to call PCP or go to the Emergency Dept.? Yes Was the patient provided with contact information for the PCP's office or ED? Yes Was to pt encouraged to call back with questions or concerns? Yes

## 2021-04-30 LAB — CULTURE, BLOOD (ROUTINE X 2)
Culture: NO GROWTH
Special Requests: ADEQUATE

## 2021-05-06 ENCOUNTER — Ambulatory Visit (INDEPENDENT_AMBULATORY_CARE_PROVIDER_SITE_OTHER): Payer: Medicare HMO | Admitting: Internal Medicine

## 2021-05-06 ENCOUNTER — Other Ambulatory Visit: Payer: Self-pay

## 2021-05-06 ENCOUNTER — Encounter: Payer: Self-pay | Admitting: Internal Medicine

## 2021-05-06 VITALS — BP 144/78 | HR 89 | Temp 98.5°F | Ht 68.0 in | Wt 146.0 lb

## 2021-05-06 DIAGNOSIS — Z0001 Encounter for general adult medical examination with abnormal findings: Secondary | ICD-10-CM | POA: Diagnosis not present

## 2021-05-06 DIAGNOSIS — J449 Chronic obstructive pulmonary disease, unspecified: Secondary | ICD-10-CM

## 2021-05-06 DIAGNOSIS — F17210 Nicotine dependence, cigarettes, uncomplicated: Secondary | ICD-10-CM

## 2021-05-06 DIAGNOSIS — I700 Atherosclerosis of aorta: Secondary | ICD-10-CM

## 2021-05-06 DIAGNOSIS — E7800 Pure hypercholesterolemia, unspecified: Secondary | ICD-10-CM

## 2021-05-06 DIAGNOSIS — I10 Essential (primary) hypertension: Secondary | ICD-10-CM

## 2021-05-06 MED ORDER — EZETIMIBE 10 MG PO TABS: 10.0000 mg | ORAL_TABLET | Freq: Every day | ORAL | 3 refills | Status: DC

## 2021-05-06 NOTE — Progress Notes (Signed)
Patient ID: Cody Grant, male   DOB: [birth year], 61 y.o.   MRN: [id number]         Chief Complaint:: wellness exam and Hospitalization Follow-up (Dx of sepsis)  With pna, copd exacerbation, also hld, smoking       HPI:  Cody Grant is a 61 y.o. male here for wellness exam; declines flu shot, shingirx, tdap, pnemovax and cologuard for now, o/w up to date                        Also Pt denies chest pain, increased sob or doe, wheezing, orthopnea, PND, increased LE swelling, palpitations, dizziness or syncope.   Pt denies polydipsia, polyuria, or new focal neuro s/s.   Pt denies fever, wt loss, night sweats, loss of appetite, or other constitutional symptoms  Tobacco down to 4 cigarretes only in the past week, almost quit.  Has been working on lower chol diet.  BP at home has been < 140/90.  No other new complaints      Wt Readings from Last 3 Encounters:  05/06/21 146 lb (66.2 kg)  04/27/21 149 lb 14.6 oz (68 kg)  03/08/21 143 lb (64.9 kg)   BP Readings from Last 3 Encounters:  05/06/21 (!) 144/78  04/27/21 133/83  03/08/21 (!) 148/80   There is no immunization history on file for this patient. There are no preventive care reminders to display for this patient.     Past Medical History:  Diagnosis Date   Allergic rhinitis, cause unspecified    Asthma    as child   Chronic lumbar radiculopathy    left with left drop foot/numbness   Chronic pain    to see Dr Delynn Flavin   COPD, severe ([place]) 06/22/2013   Factor 5 Leiden mutation, heterozygous ([place])    HTN (hypertension) 08/25/2019   Left foot drop    Lumbar disc disease    Other and unspecified hyperlipidemia 05/20/2013   Past Surgical History:  Procedure Laterality Date   SPINE SURGERY  12/2010   micro diskectomy    reports that he quit smoking 6 days ago. His smoking use included cigarettes. He has a 30.00 pack-year smoking history. He has never used smokeless tobacco. He reports current alcohol use of about 2.0 - 4.0  standard drinks per week. He reports that he does not use drugs. family history includes COPD in his mother; Diabetes in his mother; Pulmonary embolism in his father. No Known Allergies Current Outpatient Medications on File Prior to Visit  Medication Sig Dispense Refill   acetaminophen (TYLENOL) 325 MG tablet Take 650 mg by mouth every 6 (six) hours as needed for headache, fever or moderate pain.     albuterol (VENTOLIN HFA) 108 (90 Base) MCG/ACT inhaler Inhale 2 puffs into the lungs every 6 (six) hours as needed for wheezing or shortness of breath. 54 g 3   amLODipine (NORVASC) 10 MG tablet Take 1 tablet (10 mg total) by mouth daily. (Patient taking differently: Take 10 mg by mouth at bedtime.) 90 tablet 3   ipratropium-albuterol (DUONEB) 0.5-2.5 (3) MG/3ML SOLN Take 3 mLs by nebulization every 6 (six) hours as needed (wheezing or increased shortness of breath). 360 mL 0   Oxycodone HCl 10 MG TABS Take 10 mg by mouth 3 (three) times daily as needed (pain).     sildenafil (VIAGRA) 100 MG tablet Take 0.5-1 tablets (50-100 mg total) by mouth daily as needed for erectile dysfunction. 5 tablet 11  Tiotropium Bromide-Olodaterol (STIOLTO RESPIMAT) 2.5-2.5 MCG/ACT AERS Inhale 2 puffs into the lungs daily. 4 g 11   tiZANidine (ZANAFLEX) 4 MG tablet TAKE 1 TABLET(4 MG) BY MOUTH EVERY 8 HOURS AS NEEDED FOR MUSCLE SPASMS (Patient taking differently: Take 4 mg by mouth every 8 (eight) hours as needed for muscle spasms.) 90 tablet 0   zolpidem (AMBIEN) 10 MG tablet Take 1 tablet (10 mg total) by mouth at bedtime as needed for sleep. 30 tablet 0   atorvastatin (LIPITOR) 10 MG tablet Take 1 tablet (10 mg total) by mouth daily. (Patient not taking: Reported on 03/08/2021) 90 tablet 3   NARCAN 4 MG/0.1ML LIQD nasal spray kit Place 0.4 mg into the nose as needed (accidental overdose). (Patient not taking: Reported on 05/06/2021)     traZODone (DESYREL) 50 MG tablet Take 50 mg by mouth at bedtime as needed for sleep.  (Patient not taking: Reported on 04/28/2021)     No current facility-administered medications on file prior to visit.        ROS:  All others reviewed and negative.  Objective        PE:  BP (!) 144/78 (BP Location: Right Arm, Patient Position: Sitting, Cuff Size: Large)    Pulse 89    Temp 98.5 F (36.9 C) (Oral)    Ht 5\' 8"  (1.727 m)    Wt 146 lb (66.2 kg)    SpO2 97%    BMI 22.20 kg/m                 Constitutional: Pt appears in NAD               HENT: Head: NCAT.                Right Ear: External ear normal.                 Left Ear: External ear normal.                Eyes: . Pupils are equal, round, and reactive to light. Conjunctivae and EOM are normal               Nose: without d/c or deformity               Neck: Neck supple. Gross normal ROM               Cardiovascular: Normal rate and regular rhythm.                 Pulmonary/Chest: Effort normal and breath sounds without rales or wheezing.                Abd:  Soft, NT, ND, + BS, no organomegaly               Neurological: Pt is alert. At baseline orientation, motor grossly intact               Skin: Skin is warm. No rashes, no other new lesions, LE edema - none               Psychiatric: Pt behavior is normal without agitation   Micro: none  Cardiac tracings I have personally interpreted today:  none  Pertinent Radiological findings (summarize): none   Lab Results  Component Value Date   WBC 16.8 (H) 04/27/2021   HGB 11.4 (L) 04/27/2021   HCT 33.2 (L) 04/27/2021   PLT 260 04/27/2021   GLUCOSE 163 (H) 04/26/2021  CHOL 213 (H) 02/24/2019   TRIG 205.0 (H) 02/24/2019   HDL 50.60 02/24/2019   LDLDIRECT 137.0 02/24/2019   LDLCALC 105 (H) 12/19/2017   ALT 24 04/25/2021   AST 24 04/25/2021   NA 138 04/26/2021   K 3.9 04/26/2021   CL 107 04/26/2021   CREATININE 0.90 04/26/2021   BUN 13 04/26/2021   CO2 23 04/26/2021   TSH 1.41 02/24/2019   PSA 1.08 02/24/2019   INR 1.0 04/25/2021   HGBA1C 5.5 04/26/2021    Assessment/Plan:  Cody Grant is a 61 y.o. Unavailable [8] male with  has a past medical history of Allergic rhinitis, cause unspecified, Asthma, Chronic lumbar radiculopathy, Chronic pain, COPD, severe ([place]) (06/22/2013), Factor 5 Leiden mutation, heterozygous ([place]), HTN (hypertension) (08/25/2019), Left foot drop, Lumbar disc disease, and Other and unspecified hyperlipidemia (05/20/2013).  Encounter for well adult exam with abnormal findings Age and sex appropriate education and counseling updated with regular exercise and diet Referrals for preventative services - declines cologuard Immunizations addressed - declines flu shot, shingirx, tdap, pneumovax Smoking counseling   - pt counsled to quit completely, he intends to do this soon Evidence for depression or other mood disorder - none significant Most recent labs reviewed. I have personally reviewed and have noted: 1) the patient's medical and social history 2) The patient's current medications and supplements 3) The patient's height, weight, and BMI have been recorded in the chart   COPD GOLD  III / still smoking with MS phenotype With recent exacerbation now resolved, cont same tx inhaler  Aortic atherosclerosis ([place]) Pt has been statin intolerant, ok for zetia 10 qd and lower chol diet, excercise  Cigarette smoker Pt counsled to quit completely, pt intends to do so soon  Hyperlipidemia Lab Results  Component Value Date   LDLCALC 105 (H) 12/19/2017   uncontrolled, pt to start zetia 10 qd   Hypertension, uncontrolled BP Readings from Last 3 Encounters:  05/06/21 (!) 144/78  04/27/21 133/83  03/08/21 (!) 148/80   Mild uncontrolled, pt to continue medical treatment norvasc 10 as declines change  Followup: Return in about 4 months (around 09/07/2021).  Cathlean Cower, MD 05/08/2021 2:08 PM [facility]

## 2021-05-06 NOTE — Patient Instructions (Addendum)
Please quit smoking completely as you are doing  Please take all new medication as prescribed - the zetia 10 mg for cholesterol that you mentioned  Please continue all other medications as before, and refills have been done if requested.  Please have the pharmacy call with any other refills you may need.  Please continue your efforts at being more active, low cholesterol diet, and weight control.  Please keep your appointments with your specialists as you may have planned  Please make an Appointment to return in Sep 07 2021, or sooner if needed, also with Lab Appointment for testing done 3-5 days before at the [facility] (so this is for TWO appointments - please see the scheduling desk as you leave)  Due to the ongoing Covid 19 pandemic, our lab now requires an appointment for any labs done at our office.  If you need labs done and do not have an appointment, please call our office ahead of time to schedule before presenting to the lab for your testing.

## 2021-05-08 ENCOUNTER — Encounter: Payer: Self-pay | Admitting: Internal Medicine

## 2021-05-08 NOTE — Assessment & Plan Note (Signed)
Lab Results  Component Value Date   LDLCALC 105 (H) 12/19/2017   uncontrolled, pt to start zetia 10 qd

## 2021-05-08 NOTE — Assessment & Plan Note (Signed)
Age and sex appropriate education and counseling updated with regular exercise and diet Referrals for preventative services - declines cologuard Immunizations addressed - declines flu shot, shingirx, tdap, pneumovax Smoking counseling   - pt counsled to quit completely, he intends to do this soon Evidence for depression or other mood disorder - none significant Most recent labs reviewed. I have personally reviewed and have noted: 1) the patient's medical and social history 2) The patient's current medications and supplements 3) The patient's height, weight, and BMI have been recorded in the chart

## 2021-05-08 NOTE — Assessment & Plan Note (Signed)
With recent exacerbation now resolved, cont same tx inhaler

## 2021-05-08 NOTE — Assessment & Plan Note (Signed)
Pt has been statin intolerant, ok for zetia 10 qd and lower chol diet, excercise

## 2021-05-08 NOTE — Assessment & Plan Note (Signed)
BP Readings from Last 3 Encounters:  05/06/21 (!) 144/78  04/27/21 133/83  03/08/21 (!) 148/80   Mild uncontrolled, pt to continue medical treatment norvasc 10 as declines change

## 2021-05-08 NOTE — Assessment & Plan Note (Signed)
Pt counsled to quit completely, pt intends to do so soon

## 2021-05-18 ENCOUNTER — Other Ambulatory Visit: Payer: Self-pay | Admitting: *Deleted

## 2021-05-18 DIAGNOSIS — Z87891 Personal history of nicotine dependence: Secondary | ICD-10-CM

## 2021-05-18 DIAGNOSIS — F17210 Nicotine dependence, cigarettes, uncomplicated: Secondary | ICD-10-CM

## 2021-06-01 ENCOUNTER — Telehealth: Payer: Self-pay | Admitting: Internal Medicine

## 2021-06-01 MED ORDER — ZOLPIDEM TARTRATE 10 MG PO TABS: 10.0000 mg | ORAL_TABLET | Freq: Every evening | ORAL | 1 refills | Status: DC | PRN

## 2021-06-01 MED ORDER — ZOLPIDEM TARTRATE 10 MG PO TABS: 10.0000 mg | ORAL_TABLET | Freq: Every evening | ORAL | 5 refills | Status: DC | PRN

## 2021-06-01 NOTE — Telephone Encounter (Signed)
1.Medication Requested: zolpidem (AMBIEN) 10 MG tablet  2. Pharmacy (Name, [place]): [facility] [place] [place]  3. On Med List: yes  4. Last Visit with PCP: 05-06-2021  5. Next visit date with PCP: 09-07-2021   Agent: Please be advised that RX refills may take up to 3 business days. We ask that you follow-up with your pharmacy.

## 2021-06-27 ENCOUNTER — Other Ambulatory Visit: Payer: Self-pay | Admitting: Internal Medicine

## 2021-06-27 NOTE — Telephone Encounter (Signed)
Please refill as per office routine med refill policy (all routine meds to be refilled for 3 mo or monthly (per pt preference) up to one year from last visit, then month to month grace period for 3 mo, then further med refills will have to be denied) ? ?

## 2021-07-01 ENCOUNTER — Encounter: Payer: Self-pay | Admitting: Internal Medicine

## 2021-07-15 DIAGNOSIS — M5416 Radiculopathy, lumbar region: Secondary | ICD-10-CM | POA: Diagnosis not present

## 2021-07-15 DIAGNOSIS — F17200 Nicotine dependence, unspecified, uncomplicated: Secondary | ICD-10-CM | POA: Diagnosis not present

## 2021-07-15 DIAGNOSIS — Z6822 Body mass index (BMI) 22.0-22.9, adult: Secondary | ICD-10-CM | POA: Diagnosis not present

## 2021-07-15 DIAGNOSIS — Z79899 Other long term (current) drug therapy: Secondary | ICD-10-CM | POA: Diagnosis not present

## 2021-07-15 DIAGNOSIS — F17210 Nicotine dependence, cigarettes, uncomplicated: Secondary | ICD-10-CM | POA: Diagnosis not present

## 2021-07-15 DIAGNOSIS — M62830 Muscle spasm of back: Secondary | ICD-10-CM | POA: Diagnosis not present

## 2021-07-15 DIAGNOSIS — G629 Polyneuropathy, unspecified: Secondary | ICD-10-CM | POA: Diagnosis not present

## 2021-07-20 DIAGNOSIS — Z79899 Other long term (current) drug therapy: Secondary | ICD-10-CM | POA: Diagnosis not present

## 2021-08-08 ENCOUNTER — Telehealth: Payer: Self-pay | Admitting: Internal Medicine

## 2021-08-08 NOTE — Telephone Encounter (Signed)
N/A unable to leave a message for patient to call back to schedule Medicare Annual Wellness Visit  ? ?No hx of AWV eligible as of 05/01/13 ? ?Please schedule at anytime with [facility] if patient calls the office back.   ? ?Any questions, please call me at [phone]  ?

## 2021-08-15 ENCOUNTER — Other Ambulatory Visit: Payer: Self-pay | Admitting: Internal Medicine

## 2021-08-15 NOTE — Telephone Encounter (Signed)
Please refill as per office routine med refill policy (all routine meds to be refilled for 3 mo or monthly (per pt preference) up to one year from last visit, then month to month grace period for 3 mo, then further med refills will have to be denied) ? ?

## 2021-08-16 DIAGNOSIS — F17200 Nicotine dependence, unspecified, uncomplicated: Secondary | ICD-10-CM | POA: Diagnosis not present

## 2021-08-16 DIAGNOSIS — Z79899 Other long term (current) drug therapy: Secondary | ICD-10-CM | POA: Diagnosis not present

## 2021-08-16 DIAGNOSIS — Z6822 Body mass index (BMI) 22.0-22.9, adult: Secondary | ICD-10-CM | POA: Diagnosis not present

## 2021-08-16 DIAGNOSIS — G629 Polyneuropathy, unspecified: Secondary | ICD-10-CM | POA: Diagnosis not present

## 2021-08-16 DIAGNOSIS — F17210 Nicotine dependence, cigarettes, uncomplicated: Secondary | ICD-10-CM | POA: Diagnosis not present

## 2021-08-16 DIAGNOSIS — M62830 Muscle spasm of back: Secondary | ICD-10-CM | POA: Diagnosis not present

## 2021-08-16 DIAGNOSIS — M5416 Radiculopathy, lumbar region: Secondary | ICD-10-CM | POA: Diagnosis not present

## 2021-08-19 DIAGNOSIS — Z79899 Other long term (current) drug therapy: Secondary | ICD-10-CM | POA: Diagnosis not present

## 2021-08-22 ENCOUNTER — Telehealth: Payer: Self-pay

## 2021-08-22 NOTE — Telephone Encounter (Signed)
Upon further review, noted a request by Eric Form NP to repeat LDCT June 2023.  Patient has been scheduled for June 2023 ?

## 2021-08-22 NOTE — Telephone Encounter (Signed)
Received a call inquiring about scheduling annual LDCT.  Patient had CTA in Dec 2022 which resulted pulmonary nodules update so patient can defer to Dec 2023 for next LDCT.  Spouse states that would be agreeable.  Will defer scheduling to that time.   ?

## 2021-09-07 ENCOUNTER — Ambulatory Visit (INDEPENDENT_AMBULATORY_CARE_PROVIDER_SITE_OTHER): Payer: Medicare HMO | Admitting: Internal Medicine

## 2021-09-07 VITALS — BP 134/82 | HR 89 | Temp 98.4°F | Ht 68.0 in | Wt 143.0 lb

## 2021-09-07 DIAGNOSIS — F17210 Nicotine dependence, cigarettes, uncomplicated: Secondary | ICD-10-CM

## 2021-09-07 DIAGNOSIS — E7800 Pure hypercholesterolemia, unspecified: Secondary | ICD-10-CM

## 2021-09-07 DIAGNOSIS — E538 Deficiency of other specified B group vitamins: Secondary | ICD-10-CM | POA: Diagnosis not present

## 2021-09-07 DIAGNOSIS — F5101 Primary insomnia: Secondary | ICD-10-CM

## 2021-09-07 MED ORDER — ZOLPIDEM TARTRATE 10 MG PO TABS: 10.0000 mg | ORAL_TABLET | Freq: Every evening | ORAL | 1 refills | Status: DC | PRN

## 2021-09-07 NOTE — Patient Instructions (Signed)
Please quit smoking ? ?Please take the B12 1000 mcg per day ? ?Please continue all other medications as before, and refills have been done if requested - the ambien, as well as please start the cholestorol pill ? ?Please have the pharmacy call with any other refills you may need. ? ?Please continue your efforts at being more active, low cholesterol diet, and weight control. ? ?Please keep your appointments with your specialists as you may have planned ? ?Please go to the LAB at the blood drawing area for the tests to be done - at the [facility] lab at your convenience ? ?You will be contacted by phone if any changes need to be made immediately.  Otherwise, you will receive a letter about your results with an explanation, but please check with MyChart first. ? ?Please remember to sign up for MyChart if you have not done so, as this will be important to you in the future with finding out test results, communicating by private email, and scheduling acute appointments online when needed. ? ?Please make an Appointment to return in 6 months, or sooner if needed ?

## 2021-09-10 ENCOUNTER — Encounter: Payer: Self-pay | Admitting: Internal Medicine

## 2021-09-10 DIAGNOSIS — E538 Deficiency of other specified B group vitamins: Secondary | ICD-10-CM | POA: Insufficient documentation

## 2021-09-10 DIAGNOSIS — G4700 Insomnia, unspecified: Secondary | ICD-10-CM | POA: Insufficient documentation

## 2021-09-10 NOTE — Assessment & Plan Note (Signed)
Persistent, for ambien qhs prn ?

## 2021-09-10 NOTE — Assessment & Plan Note (Signed)
Lab Results  ?Component Value Date  ? LDLCALC 105 (H) 12/19/2017  ? ?Uncontrolled, goal ldl < 70, pt to start lipitor 10 ? ?

## 2021-09-10 NOTE — Assessment & Plan Note (Signed)
With recent hx low magnesium, for f/u lab ?

## 2021-09-10 NOTE — Progress Notes (Signed)
Patient ID: Cody Grant, male   DOB: 05-Aug-1960, 61 y.o.   MRN: [id number] ? ? ? ?    Chief Complaint: follow up smoking, low b12, insomnia, hld, low magnesium ? ?     HPI:  Zeke Aker is a 61 y.o. male here to f/y, overall doing ok, has LDCT in June 2023, plans to send in cologuard soon, has ongoing difficulty with getting to sleep but does well with Lorrin Mais, asks for ambien prn, trying to follow lower chol diet but willing to start statin.  Has hx of low magnesium and asks for f/u lab.  Pt denies chest pain, increased sob or doe, wheezing, orthopnea, PND, increased LE swelling, palpitations, dizziness or syncope.   Pt denies polydipsia, polyuria, or new focal neuro s/s.    Pt denies fever, wt loss, night sweats, loss of appetite, or other constitutional symptoms  Denies worsening depressive symptoms, suicidal ideation, or panic.  Still smoking, trying to quit but unable so far.   ?      ?Wt Readings from Last 3 Encounters:  ?09/07/21 143 lb (64.9 kg)  ?05/06/21 146 lb (66.2 kg)  ?04/27/21 149 lb 14.6 oz (68 kg)  ? ?BP Readings from Last 3 Encounters:  ?09/07/21 134/82  ?05/06/21 (!) 144/78  ?04/27/21 133/83  ? ?      ?Past Medical History:  ?Diagnosis Date  ? Allergic rhinitis, cause unspecified   ? Asthma   ? as child  ? Chronic lumbar radiculopathy   ? left with left drop foot/numbness  ? Chronic pain   ? to see Dr Delynn Flavin  ? 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 Factor 5 Leiden mutation, heterozygous ([place])   ? HTN (hypertension) 08/25/2019  ? Left foot drop   ? Lumbar disc disease   ? Other and unspecified hyperlipidemia 05/20/2013  ? ?Past Surgical History:  ?Procedure Laterality Date  ? SPINE SURGERY  12/2010  ? micro diskectomy  ? ? reports that he quit smoking about 4 months ago. His smoking use included cigarettes. He has a 30.00 pack-year smoking history. He has never used smokeless tobacco. He reports current alcohol use of about 2.0 - 4.0 standard drinks per week. He reports that he does not use  drugs. ?family history includes COPD in his mother; Diabetes in his mother; Pulmonary embolism in his father. ?No Known Allergies ?Current Outpatient Medications on File Prior to Visit  ?Medication Sig Dispense Refill  ? acetaminophen (TYLENOL) 325 MG tablet Take 650 mg by mouth every 6 (six) hours as needed for headache, fever or moderate pain.    ? albuterol (VENTOLIN HFA) 108 (90 Base) MCG/ACT inhaler Inhale 2 puffs into the lungs every 6 (six) hours as needed for wheezing or shortness of breath. 54 g 3  ? amLODipine (NORVASC) 10 MG tablet TAKE 1 TABLET(10 MG) BY MOUTH DAILY 90 tablet 3  ? atorvastatin (LIPITOR) 10 MG tablet Take 1 tablet (10 mg total) by mouth daily. 90 tablet 3  ? ezetimibe (ZETIA) 10 MG tablet Take 1 tablet (10 mg total) by mouth daily. 90 tablet 3  ? ipratropium-albuterol (DUONEB) 0.5-2.5 (3) MG/3ML SOLN Take 3 mLs by nebulization every 6 (six) hours as needed (wheezing or increased shortness of breath). 360 mL 0  ? NARCAN 4 MG/0.1ML LIQD nasal spray kit Place 0.4 mg into the nose as needed (accidental overdose).    ? Oxycodone HCl 10 MG TABS Take 10 mg by mouth 3 (three) times daily as needed (pain).    ?  sildenafil (VIAGRA) 100 MG tablet Take 0.5-1 tablets (50-100 mg total) by mouth daily as needed for erectile dysfunction. 5 tablet 11  ? STIOLTO RESPIMAT 2.5-2.5 MCG/ACT AERS INHALE 2 PUFFS INTO THE LUNGS DAILY 4 g 11  ? tiZANidine (ZANAFLEX) 4 MG tablet TAKE 1 TABLET(4 MG) BY MOUTH EVERY 8 HOURS AS NEEDED FOR MUSCLE SPASMS (Patient taking differently: Take 4 mg by mouth every 8 (eight) hours as needed for muscle spasms.) 90 tablet 0  ? traZODone (DESYREL) 50 MG tablet Take 50 mg by mouth at bedtime as needed for sleep.    ? ?No current facility-administered medications on file prior to visit.  ? ?     ROS:  All others reviewed and negative. ? ?Objective  ? ?     PE:  BP 134/82 (BP Location: Right Arm, Patient Position: Sitting, Cuff Size: Large)   Pulse 89   Temp 98.4 ?F (36.9 ?C) (Oral)    Ht $R'5\' 8"'Ao$  (1.727 m)   Wt 143 lb (64.9 kg)   SpO2 92%   BMI 21.74 kg/m?  ? ?              Constitutional: Pt appears in NAD ?              HENT: Head: NCAT.  ?              Right Ear: External ear normal.   ?              Left Ear: External ear normal.  ?              Eyes: . Pupils are equal, round, and reactive to light. Conjunctivae and EOM are normal ?              Nose: without d/c or deformity ?              Neck: Neck supple. Gross normal ROM ?              Cardiovascular: Normal rate and regular rhythm.   ?              Pulmonary/Chest: Effort normal and breath sounds without rales or wheezing.  ?              Abd:  Soft, NT, ND, + BS, no organomegaly ?              Neurological: Pt is alert. At baseline orientation, motor grossly intact ?              Skin: Skin is warm. No rashes, no other new lesions, LE edema - none ?              Psychiatric: Pt behavior is normal without agitation  ? ?Micro: none ? ?Cardiac tracings I have personally interpreted today:  none ? ?Pertinent Radiological findings (summarize): none  ? ?Lab Results  ?Component Value Date  ? WBC 16.8 (H) 04/27/2021  ? HGB 11.4 (L) 04/27/2021  ? HCT 33.2 (L) 04/27/2021  ? PLT 260 04/27/2021  ? GLUCOSE 163 (H) 04/26/2021  ? CHOL 213 (H) 02/24/2019  ? TRIG 205.0 (H) 02/24/2019  ? HDL 50.60 02/24/2019  ? LDLDIRECT 137.0 02/24/2019  ? LDLCALC 105 (H) 12/19/2017  ? ALT 24 04/25/2021  ? AST 24 04/25/2021  ? NA 138 04/26/2021  ? K 3.9 04/26/2021  ? CL 107 04/26/2021  ? CREATININE 0.90 04/26/2021  ? BUN 13 04/26/2021  ? CO2 23 04/26/2021  ?  TSH 1.41 02/24/2019  ? PSA 1.08 02/24/2019  ? INR 1.0 04/25/2021  ? HGBA1C 5.5 04/26/2021  ? ?Assessment/Plan:  ?Lakendrick Paradis is a 61 y.o. Unavailable [8] male with  has a past medical history of Allergic rhinitis, cause unspecified, Asthma, Chronic lumbar radiculopathy, Chronic pain, COPD, severe ([place]) (06/22/2013), Factor 5 Leiden mutation, heterozygous ([place]), HTN (hypertension) (08/25/2019), Left foot drop,  Lumbar disc disease, and Other and unspecified hyperlipidemia (05/20/2013). ? ?Hypomagnesemia ?With recent hx low magnesium, for f/u lab ? ?B12 deficiency ?Lab Results  ?Component Value Date  ? VITAMINB12 164 (L) 04/25/2021  ? ?Low, to start oral replacement - b12 1000 mcg qd ? ? ?Cigarette smoker ?Pt counseled to quit, pt not ready ? ?Insomnia ?Persistent, for ambien qhs prn ? ?Hyperlipidemia ?Lab Results  ?Component Value Date  ? LDLCALC 105 (H) 12/19/2017  ? ?Uncontrolled, goal ldl < 70, pt to start lipitor 10 ? ?Followup: Return in about 6 months (around 03/10/2022). ? ?Cathlean Cower, MD 09/10/2021 10:00 PM ?[place] ?[place] ?Internal Medicine ?

## 2021-09-10 NOTE — Assessment & Plan Note (Signed)
Lab Results  ?Component Value Date  ? VITAMINB12 164 (L) 04/25/2021  ? ?Low, to start oral replacement - b12 1000 mcg qd ? ?

## 2021-09-10 NOTE — Assessment & Plan Note (Signed)
Pt counseled to quit, pt not ready 

## 2021-09-14 DIAGNOSIS — Z6821 Body mass index (BMI) 21.0-21.9, adult: Secondary | ICD-10-CM | POA: Diagnosis not present

## 2021-09-14 DIAGNOSIS — Z79899 Other long term (current) drug therapy: Secondary | ICD-10-CM | POA: Diagnosis not present

## 2021-09-14 DIAGNOSIS — M5416 Radiculopathy, lumbar region: Secondary | ICD-10-CM | POA: Diagnosis not present

## 2021-09-14 DIAGNOSIS — G629 Polyneuropathy, unspecified: Secondary | ICD-10-CM | POA: Diagnosis not present

## 2021-09-14 DIAGNOSIS — F17210 Nicotine dependence, cigarettes, uncomplicated: Secondary | ICD-10-CM | POA: Diagnosis not present

## 2021-09-14 DIAGNOSIS — F17200 Nicotine dependence, unspecified, uncomplicated: Secondary | ICD-10-CM | POA: Diagnosis not present

## 2021-09-22 DIAGNOSIS — Z79899 Other long term (current) drug therapy: Secondary | ICD-10-CM | POA: Diagnosis not present

## 2021-10-14 DIAGNOSIS — Z79899 Other long term (current) drug therapy: Secondary | ICD-10-CM | POA: Diagnosis not present

## 2021-10-14 DIAGNOSIS — Z6821 Body mass index (BMI) 21.0-21.9, adult: Secondary | ICD-10-CM | POA: Diagnosis not present

## 2021-10-14 DIAGNOSIS — R030 Elevated blood-pressure reading, without diagnosis of hypertension: Secondary | ICD-10-CM | POA: Diagnosis not present

## 2021-10-14 DIAGNOSIS — M5416 Radiculopathy, lumbar region: Secondary | ICD-10-CM | POA: Diagnosis not present

## 2021-10-26 ENCOUNTER — Ambulatory Visit (INDEPENDENT_AMBULATORY_CARE_PROVIDER_SITE_OTHER)
Admission: RE | Admit: 2021-10-26 | Discharge: 2021-10-26 | Disposition: A | Discharge status: Home / Self Care | Payer: Medicare HMO | Source: Ambulatory Visit | Attending: Internal Medicine | Admitting: Internal Medicine

## 2021-10-26 DIAGNOSIS — Z122 Encounter for screening for malignant neoplasm of respiratory organs: Secondary | ICD-10-CM

## 2021-10-26 DIAGNOSIS — J439 Emphysema, unspecified: Secondary | ICD-10-CM | POA: Diagnosis not present

## 2021-10-26 DIAGNOSIS — I700 Atherosclerosis of aorta: Secondary | ICD-10-CM

## 2021-10-26 DIAGNOSIS — F17210 Nicotine dependence, cigarettes, uncomplicated: Secondary | ICD-10-CM | POA: Diagnosis not present

## 2021-10-26 DIAGNOSIS — Z87891 Personal history of nicotine dependence: Secondary | ICD-10-CM | POA: Diagnosis not present

## 2021-10-28 ENCOUNTER — Other Ambulatory Visit: Payer: Self-pay

## 2021-10-28 DIAGNOSIS — Z87891 Personal history of nicotine dependence: Secondary | ICD-10-CM

## 2021-10-28 DIAGNOSIS — Z122 Encounter for screening for malignant neoplasm of respiratory organs: Secondary | ICD-10-CM

## 2021-10-28 DIAGNOSIS — F17210 Nicotine dependence, cigarettes, uncomplicated: Secondary | ICD-10-CM

## 2021-11-14 DIAGNOSIS — Z6821 Body mass index (BMI) 21.0-21.9, adult: Secondary | ICD-10-CM | POA: Diagnosis not present

## 2021-11-14 DIAGNOSIS — M5416 Radiculopathy, lumbar region: Secondary | ICD-10-CM | POA: Diagnosis not present

## 2021-11-14 DIAGNOSIS — R030 Elevated blood-pressure reading, without diagnosis of hypertension: Secondary | ICD-10-CM | POA: Diagnosis not present

## 2021-11-14 DIAGNOSIS — Z79899 Other long term (current) drug therapy: Secondary | ICD-10-CM | POA: Diagnosis not present

## 2021-12-15 DIAGNOSIS — Z79899 Other long term (current) drug therapy: Secondary | ICD-10-CM | POA: Diagnosis not present

## 2021-12-15 DIAGNOSIS — M5416 Radiculopathy, lumbar region: Secondary | ICD-10-CM | POA: Diagnosis not present

## 2021-12-15 DIAGNOSIS — R030 Elevated blood-pressure reading, without diagnosis of hypertension: Secondary | ICD-10-CM | POA: Diagnosis not present

## 2022-01-14 DIAGNOSIS — R030 Elevated blood-pressure reading, without diagnosis of hypertension: Secondary | ICD-10-CM | POA: Diagnosis not present

## 2022-01-14 DIAGNOSIS — M5416 Radiculopathy, lumbar region: Secondary | ICD-10-CM | POA: Diagnosis not present

## 2022-01-14 DIAGNOSIS — Z79899 Other long term (current) drug therapy: Secondary | ICD-10-CM | POA: Diagnosis not present

## 2022-03-22 ENCOUNTER — Other Ambulatory Visit: Payer: Self-pay | Admitting: Internal Medicine

## 2022-03-22 NOTE — Telephone Encounter (Signed)
Patient called stating that he is completely out of this medication - I reminded patient of the 24-48 hour refill request timeline.

## 2022-05-16 LAB — LAB REPORT - SCANNED
EGFR (Non-African Amer.): 80
EGFR: 98

## 2022-06-08 ENCOUNTER — Other Ambulatory Visit: Payer: Self-pay | Admitting: Internal Medicine

## 2022-06-08 ENCOUNTER — Telehealth: Payer: Self-pay | Admitting: Internal Medicine

## 2022-06-08 NOTE — Telephone Encounter (Signed)
Please refill as per office routine med refill policy (all routine meds to be refilled for 3 mo or monthly (per pt preference) up to one year from last visit, then month to month grace period for 3 mo, then further med refills will have to be denied) ? ?

## 2022-06-09 MED ORDER — STIOLTO RESPIMAT 2.5-2.5 MCG/ACT IN AERS: 2.0000 | INHALATION_SPRAY | Freq: Every day | RESPIRATORY_TRACT | 0 refills | Status: DC

## 2022-06-09 NOTE — Telephone Encounter (Signed)
Called and advised him that I can send one refill in for his inhaler but he needs to come to follow up. Went over pharmacy. Nothing further needed

## 2022-06-09 NOTE — Telephone Encounter (Signed)
That's fine

## 2022-06-09 NOTE — Telephone Encounter (Signed)
Dr. Melvyn Novas, please advise if you would be okay with refilling pt's stiolto with giving him zero refills so that way he will have inhaler to hold him over until upcoming OV?

## 2022-06-14 ENCOUNTER — Telehealth: Payer: Self-pay | Admitting: Internal Medicine

## 2022-06-15 ENCOUNTER — Other Ambulatory Visit: Payer: Self-pay | Admitting: *Deleted

## 2022-06-15 DIAGNOSIS — M5416 Radiculopathy, lumbar region: Secondary | ICD-10-CM | POA: Diagnosis not present

## 2022-06-15 DIAGNOSIS — Z79899 Other long term (current) drug therapy: Secondary | ICD-10-CM | POA: Diagnosis not present

## 2022-06-15 DIAGNOSIS — Z1211 Encounter for screening for malignant neoplasm of colon: Secondary | ICD-10-CM | POA: Diagnosis not present

## 2022-06-15 DIAGNOSIS — R030 Elevated blood-pressure reading, without diagnosis of hypertension: Secondary | ICD-10-CM | POA: Diagnosis not present

## 2022-06-15 MED ORDER — STIOLTO RESPIMAT 2.5-2.5 MCG/ACT IN AERS: 2.0000 | INHALATION_SPRAY | Freq: Every day | RESPIRATORY_TRACT | 3 refills | Status: DC

## 2022-06-15 NOTE — Telephone Encounter (Signed)
Called [facility]. They stated that it was for a refill request but they said they had already gotten it and didn't need anything anymore.   Nothing further needed.

## 2022-06-21 ENCOUNTER — Telehealth: Payer: Self-pay

## 2022-06-21 NOTE — Telephone Encounter (Signed)
Called patient to schedule Medicare Annual Wellness Visit (AWV). Left message for patient to call back and schedule Medicare Annual Wellness Visit (AWV).  Last date of AWV: NO HX of AWV, eligible 05/01/13  Please schedule an appointment at any time with [facility].    Norton Blizzard, [place] (AAMA)  [place] [facility] [phone]

## 2022-06-23 ENCOUNTER — Encounter: Payer: Self-pay | Admitting: Internal Medicine

## 2022-06-23 ENCOUNTER — Ambulatory Visit: Payer: Medicare HMO | Admitting: Internal Medicine

## 2022-06-23 VITALS — BP 140/80 | HR 83 | Temp 97.9°F | Ht 68.5 in | Wt 157.4 lb

## 2022-06-23 DIAGNOSIS — J449 Chronic obstructive pulmonary disease, unspecified: Secondary | ICD-10-CM | POA: Diagnosis not present

## 2022-06-23 DIAGNOSIS — F17210 Nicotine dependence, cigarettes, uncomplicated: Secondary | ICD-10-CM

## 2022-06-23 MED ORDER — PREDNISONE 10 MG PO TABS: ORAL_TABLET | ORAL | 2 refills | Status: DC

## 2022-06-23 NOTE — Assessment & Plan Note (Addendum)
Counseled re importance of smoking cessation but did not meet time criteria for separate billing       F/u yearly, sooner prn      Each maintenance medication was reviewed in detail including emphasizing most importantly the difference between maintenance and prns and under what circumstances the prns are to be triggered using an action plan format where appropriate.  Total time for H and P, chart review, counseling, reviewing hfa/smi/neb  device(s) and generating customized AVS unique to this office visit / same day charting = 22 min

## 2022-06-23 NOTE — Progress Notes (Addendum)
Subjective:    Patient ID: Cody Grant, male   DOB: [birth year]     MRN: [id number]    Brief patient profile:  89  yowm from [facility]  active smoker with GOLD II copd / MS phenotype with large reversible component 2015 referred to pulmonary clinic 01/25/2017 by Dr   Jenny Reichmann    History of Present Illness  01/25/2017 1st [facility] office visit/ Cody Grant   Chief Complaint  Patient presents with   Pulmonary Consult    Referred by Dr. Cathlean Cower. Pt dxed with COPD in 2016. He states he has trouble with his breathing with hot/humid weather. He tends to have trouble in the am's, and has a non prod cough. He states he went a period of time without his spiriva due to cost and breathing got worse, but has improved since he started this back recently. He uses proair and also albuterol neb both 1 x per wk on average.   limited more by L radicular back  pain than   sob maint on spiriva dpi which he assoc with upper airway pattern coughing  Recurrent acute cough flares 2 x year then needs neb more then but usually not needing  Much saba unless he overdoes it out in the heat   rec Plan A = Automatic = Stiolto 2 pffs each am  Work on inhaler technique:  relax and gently blow all the way out then take a nice smooth deep breath back in, triggering the inhaler at same time you start breathing in.  Hold for up to 5 seconds if you can. Blow out thru nose. Rinse and gargle with water when done Plan B = Backup Only use your albuterol as a rescue medication Plan C = Crisis - only use your albuterol nebulizer if you first try Plan B and it fails to help > ok to use the nebulizer up to every 4 hours but if start needing it regularly call for immediate appointment     03/09/2017  f/u ov/Cody Grant re:  Copd GOLD III // still smoking 7 cigs per day / not using stiolto consistently  Chief Complaint  Patient presents with   Follow-up    PFT's done today.  He states that he has good days and bad days. He is using his proair  4 x per wk on average.    lot of cough/gagging worse at hs but usually sleeps/wakes ok  then after coffee starts back up  [facility] = can't walk a nl pace on a flat grade s sob but does fine slow and flat eg walking flat/slow  Only started stiolto a few days prior to OV  Since wanted to use up his spiriva supply first  rec Plan A = Automatic = Stiolto 2 pffs each am  Plan B = Backup Only use your albuterol as a rescue medication The key is to stop smoking completely before smoking completely stops you!       02/25/2021  f/u ov/Cody Grant re: GOLD 3 copd/ MS phenotype/ smoking still  maint on stiolto 2 daily   Chief Complaint  Patient presents with   Follow-up    Breathing is overall doing well. He has some cough and PND- cough is prod in the am with clear to yellow sputum.  He has not had to use his albuterol inhaler in the past few wks.    Dyspnea:  walks dog twice daily x 1.5h / some hills  Cough: none  Sleeping: on a flat bed /  one pillow on side  SABA use: none  02: none  Covid status:  vax none / isolates  Rec Plan A = Automatic = Always=    stioto 2 puff each am  Plan B = Backup (to supplement plan A, not to replace it) Only use your albuterol inhaler as a rescue medication Plan C = Crisis (instead of Plan B but only if Plan B stops working) - only use your albuterol nebulizer if you first try Plan B  Plan D = Delatasone = prednisone   - take 6 days when ABC not working great  We will get you a new nebulizer machine    Please schedule a follow up visit in 12 months but call sooner if needed    06/23/2022  f/u ov/Cody Grant re: GOLD 3/ MS/still smoking  maint on stiolto  with pred as plan D not needed  Chief Complaint  Patient presents with   Follow-up    Doing well.  Needs refill on Stiolto.  Dyspnea:  still walking dog  Cough: some am phlegm attributes to pnds  -clears p one hour Sleeping: usually flat/ on side / one pillow  SABA use: neb with acute flares / hfa once a month 02:  none  Covid status:   vax none/ never infected ?not sure  Lung cancer screening :  in program     No obvious day to day or daytime variability or assoc excess/ purulent sputum or mucus plugs or hemoptysis or cp or chest tightness, subjective wheeze or overt sinus or hb symptoms.   Sleeps  without nocturnal  or early am exacerbation  of respiratory  c/o's or need for noct saba. Also denies any obvious fluctuation of symptoms with weather or environmental changes or other aggravating or alleviating factors except as outlined above   No unusual exposure hx or h/o childhood pna/ asthma or knowledge of premature birth.  Current Allergies, Complete Past Medical History, Past Surgical History, Family History, and Social History were reviewed in [employer] record.  ROS  The following are not active complaints unless bolded Hoarseness, sore throat, dysphagia, dental problems, itching, sneezing,  nasal congestion or discharge of excess mucus or purulent secretions, ear ache,   fever, chills, sweats, unintended wt loss or wt gain, classically pleuritic or exertional cp,  orthopnea pnd or arm/hand swelling  or leg swelling, presyncope, palpitations, abdominal pain, anorexia, nausea, vomiting, diarrhea  or change in bowel habits or change in bladder habits, change in stools or change in urine, dysuria, hematuria,  rash, arthralgias, visual complaints, headache, numbness, weakness or ataxia or problems with walking or coordination,  change in mood or  memory.        Current Meds  Medication Sig   acetaminophen (TYLENOL) 325 MG tablet Take 650 mg by mouth every 6 (six) hours as needed for headache, fever or moderate pain.   albuterol (VENTOLIN HFA) 108 (90 Base) MCG/ACT inhaler Inhale 2 puffs into the lungs every 6 (six) hours as needed for wheezing or shortness of breath.   amLODipine (NORVASC) 10 MG tablet TAKE 1 TABLET(10 MG) BY MOUTH DAILY   ipratropium-albuterol (DUONEB) 0.5-2.5  (3) MG/3ML SOLN Take 3 mLs by nebulization every 6 (six) hours as needed (wheezing or increased shortness of breath).   NARCAN 4 MG/0.1ML LIQD nasal spray kit Place 0.4 mg into the nose as needed (accidental overdose).   Oxycodone HCl 10 MG TABS Take 10 mg by mouth 3 (three) times daily as needed (  pain).   Tiotropium Bromide-Olodaterol (STIOLTO RESPIMAT) 2.5-2.5 MCG/ACT AERS Inhale 2 puffs into the lungs daily.   tiZANidine (ZANAFLEX) 4 MG tablet TAKE 1 TABLET(4 MG) BY MOUTH EVERY 8 HOURS AS NEEDED FOR MUSCLE SPASMS   zolpidem (AMBIEN) 10 MG tablet TAKE 1 TABLET(10 MG) BY MOUTH AT BEDTIME AS NEEDED FOR SLEEP                    Objective:   Physical Exam  wts   06/23/2022        157  02/25/2021      145 02/26/2020      144  04/10/2018        162  03/11/2018      161  09/06/2017          163   01/25/17 167 lb 6.4 oz (75.9 kg)  12/19/16 165 lb (74.8 kg)  08/02/16 167 lb 2 oz (75.8 kg)    Vital signs reviewed  06/23/2022  - Note at rest 02 sats  94% on RA  General appearance:    amb pleasant wm nad    HEENT : Oropharynx  clear      NECK :  without  apparent JVD/ palpable Nodes/TM    LUNGS: no acc muscle use,  Mild barrel  contour chest wall with bilateral distant mid exp rhonchi  and  without cough on insp or exp maneuvers  and mild  Hyperresonant  to  percussion bilaterally     CV:  RRR  no s3 or murmur or increase in P2, and no edema   ABD:  soft and nontender   MS:  Nl gait/ ext warm without deformities Or obvious joint restrictions  calf tenderness, cyanosis or clubbing     SKIN: warm and dry without lesions    NEURO:  alert, approp, nl sensorium with  no motor or cerebellar deficits apparent.        Assessment:

## 2022-06-23 NOTE — Assessment & Plan Note (Addendum)
Active smoker PFT's   05/20/2013   FEV1 1.95 (54 % ) ratio 59  p 39 % improvement from saba p ? prior to study with DLCO  62 % corrects to 85  % for alv volume   - 01/25/2017    try stiolto 2 each am  - PFT's  03/09/2017  FEV1 1.65 (48 % ) ratio 54  p 1 % improvement from saba p stiolto  prior to study with DLCO  56/57 % corrects to 74 % for alv volume   - 03/11/2018 added prn prednisone x 6 days for refractory symptoms - 03/11/2018  After extensive coaching inhaler device,  effectiveness =    90% with smi and hfa  - 04/10/2018   Walked RA  2 laps @ 234f each @ avg pace  stopped due to end of study no desats -  [employer] [facility] AT screen 02/26/2020 >>>  MS   Level 148   Pt is Group B in terms of symptom/risk and laba/lama therefore appropriate rx at this point >>>  stiolto with pred as back up but not needed this past year

## 2022-06-23 NOTE — Patient Instructions (Signed)
The key is to stop smoking completely before smoking completely stops you!   Please schedule a follow up visit in 12 months but call sooner if needed  

## 2022-07-12 DIAGNOSIS — M5416 Radiculopathy, lumbar region: Secondary | ICD-10-CM | POA: Diagnosis not present

## 2022-07-12 DIAGNOSIS — R030 Elevated blood-pressure reading, without diagnosis of hypertension: Secondary | ICD-10-CM | POA: Diagnosis not present

## 2022-07-12 DIAGNOSIS — Z79899 Other long term (current) drug therapy: Secondary | ICD-10-CM | POA: Diagnosis not present

## 2022-07-18 DIAGNOSIS — Z79899 Other long term (current) drug therapy: Secondary | ICD-10-CM | POA: Diagnosis not present

## 2022-08-14 DIAGNOSIS — Z79899 Other long term (current) drug therapy: Secondary | ICD-10-CM | POA: Diagnosis not present

## 2022-08-14 DIAGNOSIS — R030 Elevated blood-pressure reading, without diagnosis of hypertension: Secondary | ICD-10-CM | POA: Diagnosis not present

## 2022-08-14 DIAGNOSIS — M5416 Radiculopathy, lumbar region: Secondary | ICD-10-CM | POA: Diagnosis not present

## 2022-08-18 ENCOUNTER — Other Ambulatory Visit: Payer: Self-pay

## 2022-08-18 MED ORDER — AMLODIPINE BESYLATE 10 MG PO TABS: ORAL_TABLET | ORAL | 3 refills | Status: DC

## 2022-09-05 ENCOUNTER — Other Ambulatory Visit: Payer: Self-pay | Admitting: Acute Care

## 2022-09-05 DIAGNOSIS — Z87891 Personal history of nicotine dependence: Secondary | ICD-10-CM

## 2022-09-05 DIAGNOSIS — Z122 Encounter for screening for malignant neoplasm of respiratory organs: Secondary | ICD-10-CM

## 2022-09-05 DIAGNOSIS — F17210 Nicotine dependence, cigarettes, uncomplicated: Secondary | ICD-10-CM

## 2022-09-12 DIAGNOSIS — R030 Elevated blood-pressure reading, without diagnosis of hypertension: Secondary | ICD-10-CM | POA: Diagnosis not present

## 2022-09-12 DIAGNOSIS — M5416 Radiculopathy, lumbar region: Secondary | ICD-10-CM | POA: Diagnosis not present

## 2022-09-13 ENCOUNTER — Ambulatory Visit (INDEPENDENT_AMBULATORY_CARE_PROVIDER_SITE_OTHER): Payer: Medicare HMO | Admitting: Internal Medicine

## 2022-09-13 VITALS — BP 150/82 | HR 77 | Temp 98.2°F | Ht 68.5 in | Wt 162.2 lb

## 2022-09-13 DIAGNOSIS — F5101 Primary insomnia: Secondary | ICD-10-CM | POA: Diagnosis not present

## 2022-09-13 DIAGNOSIS — I700 Atherosclerosis of aorta: Secondary | ICD-10-CM | POA: Diagnosis not present

## 2022-09-13 DIAGNOSIS — E7800 Pure hypercholesterolemia, unspecified: Secondary | ICD-10-CM

## 2022-09-13 DIAGNOSIS — F17210 Nicotine dependence, cigarettes, uncomplicated: Secondary | ICD-10-CM | POA: Diagnosis not present

## 2022-09-13 DIAGNOSIS — Z0001 Encounter for general adult medical examination with abnormal findings: Secondary | ICD-10-CM | POA: Diagnosis not present

## 2022-09-13 DIAGNOSIS — Z789 Other specified health status: Secondary | ICD-10-CM

## 2022-09-13 DIAGNOSIS — E559 Vitamin D deficiency, unspecified: Secondary | ICD-10-CM

## 2022-09-13 DIAGNOSIS — E538 Deficiency of other specified B group vitamins: Secondary | ICD-10-CM | POA: Diagnosis not present

## 2022-09-13 DIAGNOSIS — I10 Essential (primary) hypertension: Secondary | ICD-10-CM

## 2022-09-13 MED ORDER — ZOLPIDEM TARTRATE 10 MG PO TABS: ORAL_TABLET | ORAL | 1 refills | Status: DC

## 2022-09-13 MED ORDER — AMLODIPINE BESYLATE 10 MG PO TABS: ORAL_TABLET | ORAL | 3 refills | Status: DC

## 2022-09-13 NOTE — Progress Notes (Signed)
Patient ID: Cody Grant, male   DOB: [birth year], 62 y.o.   MRN: [id number]        Chief Complaint:: wellness exam and Medication Refill (On [facility] )  For insomnia, htn, hld, smokiing       HPI:  Cody Grant is a 62 y.o. male here for wellness exam; for tdap and shingrix at pharmacy, declines cologuard f/u for now, o/w up to date.  Pt still smoking, not ready to quit               Also has been statin intolerant. Wife says hearing worse bilateral, ? Wax but might need ENT.  Has routine labs with pain [facility] recently.  Pt denies chest pain, increased sob or doe, wheezing, orthopnea, PND, increased LE swelling, palpitations, dizziness or syncope.   Pt denies polydipsia, polyuria, or new focal neuro s/s.    Pt denies fever, wt loss, night sweats, loss of appetite, or other constitutional symptoms   Has gained several lbs with less active, plans to do better.    Wt Readings from Last 3 Encounters:  09/15/22 162 lb (73.5 kg)  09/13/22 162 lb 4 oz (73.6 kg)  06/23/22 157 lb 6.4 oz (71.4 kg)   BP Readings from Last 3 Encounters:  09/15/22 (!) 146/84  09/13/22 (!) 150/82  06/23/22 (!) 140/80   There is no immunization history on file for this patient. 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 He has a 7.50 pack-year smoking history. He has never  used smokeless tobacco. He reports current alcohol use of about 2.0 - 4.0 standard drinks of alcohol per week. He reports that he does not use drugs. family history includes COPD in his mother; Diabetes in his mother; Pulmonary embolism in his father. Allergies  Allergen Reactions   Statins Other (See Comments)    Leg weakness   Current Outpatient Medications on File Prior to Visit  Medication Sig Dispense Refill   albuterol (VENTOLIN HFA) 108 (90 Base) MCG/ACT inhaler Inhale 2 puffs into the lungs every 6 (six) hours as needed for wheezing or shortness of breath. 54 g 3   ipratropium-albuterol (DUONEB) 0.5-2.5 (3) MG/3ML SOLN Take 3 mLs by nebulization every 6 (six) hours as needed (wheezing or increased shortness of breath). 360 mL 0   NARCAN 4 MG/0.1ML LIQD nasal spray kit Place 0.4 mg into the nose as needed (accidental overdose).     Oxycodone HCl 10 MG TABS Take 10 mg by mouth 3 (three) times daily as needed (pain).     predniSONE (DELTASONE) 10 MG tablet Take  4 each am x 2 days,   2 each am x 2 days,  1 each am x 2 days and  stop 14 tablet 2   Tiotropium Bromide-Olodaterol (STIOLTO RESPIMAT) 2.5-2.5 MCG/ACT AERS Inhale 2 puffs into the lungs daily. 12 g 3   tiZANidine (ZANAFLEX) 4 MG tablet TAKE 1 TABLET(4 MG) BY MOUTH EVERY 8 HOURS AS NEEDED FOR MUSCLE SPASMS 90 tablet 0   acetaminophen (TYLENOL) 325 MG tablet Take 650 mg by mouth every 6 (six) hours as needed for headache, fever or moderate pain. (Patient not taking: Reported on 09/13/2022)     No current facility-administered medications on file prior to visit.        ROS:  All others reviewed and negative.  Objective        PE:  BP (!) 150/82   Pulse 77   Temp 98.2 F (36.8 C) (Temporal)   Ht 5' 8.5" (1.74 m)   Wt 162 lb 4 oz (73.6 kg)   SpO2 93%   BMI 24.31 kg/m                 Constitutional: Pt appears in NAD               HENT: Head: NCAT.                Right Ear: External ear normal.                 Left Ear:  External ear normal.                Eyes: . Pupils are equal, round, and reactive to light. Conjunctivae and EOM are normal               Nose: without d/c or deformity               Neck: Neck supple. Gross normal ROM               Cardiovascular: Normal rate and regular rhythm.                 Pulmonary/Chest: Effort normal and breath sounds without rales or wheezing.                Abd:  Soft, NT, ND, + BS, no organomegaly               Neurological: Pt is alert. At baseline orientation, motor grossly intact               Skin: Skin is warm. No rashes, no other new lesions, LE edema - none               Psychiatric: Pt behavior is normal without agitation   Micro: none  Cardiac tracings I have personally interpreted today:  none  Pertinent Radiological findings (summarize): none   Lab Results  Component Value Date   WBC 16.8 (H) 04/27/2021   HGB 11.4 (L) 04/27/2021   HCT 33.2 (L) 04/27/2021   PLT 260 04/27/2021   GLUCOSE 163 (H) 04/26/2021   CHOL 213 (H) 02/24/2019   TRIG 205.0 (H) 02/24/2019   HDL 50.60 02/24/2019   LDLDIRECT 137.0 02/24/2019   LDLCALC 105 (H) 12/19/2017   ALT 24 04/25/2021   AST 24 04/25/2021   NA 138 04/26/2021   K 3.9 04/26/2021   CL 107 04/26/2021   CREATININE 0.90 04/26/2021   BUN 13 04/26/2021   CO2 23 04/26/2021   TSH 1.41 02/24/2019   PSA 1.08 02/24/2019   INR 1.0 04/25/2021   HGBA1C 5.5 04/26/2021   Assessment/Plan:  Cody Grant is a  62 y.o. Unavailable [8] male with  has a past medical history of Allergic rhinitis, cause unspecified, Asthma, Chronic lumbar radiculopathy, Chronic pain, COPD, severe (HCC) (06/22/2013), Factor 5 Leiden mutation, heterozygous (HCC), HTN (hypertension) (08/25/2019), Left foot drop, Lumbar disc disease, and Other and unspecified hyperlipidemia (05/20/2013).  Encounter for well adult exam with abnormal findings Age and sex appropriate education and counseling updated with regular exercise and diet Referrals for  preventative services - declines cologuard Immunizations addressed - for tdap and shingrix at pharmacy Smoking counseling  - pt counsled to quit, pt not ready Evidence for depression or other mood disorder - none significant Most recent labs reviewed. I have personally reviewed and have noted: 1) the patient's medical and social history 2) The patient's current medications and supplements 3) The patient's height, weight, and BMI have been recorded in the chart   Aortic atherosclerosis (HCC) Pt to continue diet, wt control, has been statin intolerant not willing to try again, declines repatha or zetia for now  Hyperlipidemia Pt to continue diet, wt control, has been statin intolerant not willing to try again, declines repatha or zetia for now  Cigarette smoker Pt counsled to quit, pt not ready  Hypertension, uncontrolled BP Readings from Last 3 Encounters:  09/15/22 (!) 146/84  09/13/22 (!) 150/82  06/23/22 (!) 140/80   Uncontrolled, pt state controlled at home, and elevated due to nervous here, pt to continue medical treatment norvasc 10 qd   Vitamin D deficiency For f/u level  Insomnia Uncontrolled, chronic persistent, for ambien qhs prn restart  B12 deficiency Lab Results  Component Value Date   VITAMINB12 164 (L) 04/25/2021   Low, to start oral replacement - b12 1000 mcg qd   Statin intolerance Declines repatha or zetia for now  Followup: Return in about 1 year (around 09/13/2023).  Oliver Barre, MD 09/16/2022 9:23 PM [facility]  Primary Care - [facility]

## 2022-09-13 NOTE — Patient Instructions (Signed)
Please continue all other medications as before, and refills have been done if requested.  Please have the pharmacy call with any other refills you may need.  Please continue your efforts at being more active, low cholesterol diet, and weight control.  You are otherwise up to date with prevention measures today.  Please keep your appointments with your specialists as you may have planned  We can hold on further labs today  Please make an Appointment to return for your 1 year visit, or sooner if needed

## 2022-09-15 ENCOUNTER — Emergency Department (HOSPITAL_COMMUNITY): Payer: Medicare HMO

## 2022-09-15 ENCOUNTER — Emergency Department (HOSPITAL_COMMUNITY)
Admission: EM | Admit: 2022-09-15 | Discharge: 2022-09-15 | Disposition: A | Discharge status: Home / Self Care | Payer: Medicare HMO | Attending: Emergency Medicine | Admitting: Emergency Medicine

## 2022-09-15 ENCOUNTER — Other Ambulatory Visit: Payer: Self-pay

## 2022-09-15 DIAGNOSIS — H532 Diplopia: Secondary | ICD-10-CM

## 2022-09-15 DIAGNOSIS — R1011 Right upper quadrant pain: Secondary | ICD-10-CM | POA: Insufficient documentation

## 2022-09-15 DIAGNOSIS — Y92410 Unspecified street and highway as the place of occurrence of the external cause: Secondary | ICD-10-CM | POA: Diagnosis not present

## 2022-09-15 DIAGNOSIS — S0990XA Unspecified injury of head, initial encounter: Secondary | ICD-10-CM | POA: Diagnosis not present

## 2022-09-15 DIAGNOSIS — R519 Headache, unspecified: Secondary | ICD-10-CM | POA: Diagnosis not present
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
## 2022-09-16 ENCOUNTER — Encounter: Payer: Self-pay | Admitting: Internal Medicine

## 2022-09-16 NOTE — Assessment & Plan Note (Signed)
Age and sex appropriate education and counseling updated with regular exercise and diet Referrals for preventative services - declines cologuard Immunizations addressed - for tdap and shingrix at pharmacy Smoking counseling  - pt counsled to quit, pt not ready Evidence for depression or other mood disorder - none significant Most recent labs reviewed. I have personally reviewed and have noted: 1) the patient's medical and social history 2) The patient's current medications and supplements 3) The patient's height, weight, and BMI have been recorded in the chart

## 2022-09-16 NOTE — Assessment & Plan Note (Signed)
Uncontrolled, chronic persistent, for ambien qhs prn restart

## 2022-09-16 NOTE — Assessment & Plan Note (Signed)
Pt to continue diet, wt control, has been statin intolerant not willing to try again, declines repatha or zetia for now 

## 2022-09-16 NOTE — Assessment & Plan Note (Signed)
Lab Results  ?Component Value Date  ? VITAMINB12 164 (L) 04/25/2021  ? ?Low, to start oral replacement - b12 1000 mcg qd ? ?

## 2022-09-16 NOTE — Assessment & Plan Note (Signed)
Pt to continue diet, wt control, has been statin intolerant not willing to try again, declines repatha or zetia for now

## 2022-09-16 NOTE — Assessment & Plan Note (Signed)
For f/u level 

## 2022-09-16 NOTE — Assessment & Plan Note (Signed)
Pt counsled to quit, pt not ready °

## 2022-09-16 NOTE — Assessment & Plan Note (Signed)
BP Readings from Last 3 Encounters:  09/15/22 (!) 146/84  09/13/22 (!) 150/82  06/23/22 (!) 140/80   Uncontrolled, pt state controlled at home, and elevated due to nervous here, pt to continue medical treatment norvasc 10 qd

## 2022-09-16 NOTE — Assessment & Plan Note (Signed)
Declines repatha or zetia for now

## 2022-09-19 ENCOUNTER — Encounter: Payer: Self-pay | Admitting: *Deleted

## 2022-09-19 ENCOUNTER — Telehealth: Payer: Self-pay | Admitting: *Deleted
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
## 2022-09-20 ENCOUNTER — Ambulatory Visit (INDEPENDENT_AMBULATORY_CARE_PROVIDER_SITE_OTHER): Payer: Medicare HMO | Admitting: Internal Medicine

## 2022-09-20 ENCOUNTER — Encounter: Payer: Self-pay | Admitting: Internal Medicine

## 2022-09-20 DIAGNOSIS — H9193 Unspecified hearing loss, bilateral: Secondary | ICD-10-CM | POA: Diagnosis not present

## 2022-09-20 DIAGNOSIS — I10 Essential (primary) hypertension: Secondary | ICD-10-CM | POA: Diagnosis not present

## 2022-09-20 DIAGNOSIS — F419 Anxiety disorder, unspecified: Secondary | ICD-10-CM

## 2022-09-20 DIAGNOSIS — S060X0D Concussion without loss of consciousness, subsequent encounter: Secondary | ICD-10-CM
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
## 2022-09-23 ENCOUNTER — Encounter: Payer: Self-pay | Admitting: Internal Medicine

## 2022-09-23 DIAGNOSIS — S060XAA Concussion with loss of consciousness status unknown, initial encounter: Secondary | ICD-10-CM | POA: Insufficient documentation

## 2022-09-23 NOTE — Patient Instructions (Signed)
Please continue all other medications as before, and refills have been done if requested.  Please have the pharmacy call with any other refills you may need.  Please keep your appointments with your specialists as you may have planned  You will be contacted regarding the referral for: ENT   

## 2022-09-23 NOTE — Assessment & Plan Note (Signed)
Mild, improving, cognitive ability stable,  to f/u any worsening symptoms or concerns

## 2022-09-23 NOTE — Assessment & Plan Note (Signed)
No wax noted or other significant abnormality - for ENT referral
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
## 2022-09-23 NOTE — Assessment & Plan Note (Signed)
BP Readings from Last 3 Encounters:  09/20/22 (!) 146/94  09/15/22 (!) 146/84  09/13/22 (!) 150/82   Pt is adamant BP at home better controlled, pt to continue medical treatment norvasc 10 qd

## 2022-09-28 DIAGNOSIS — S060X0A Concussion without loss of consciousness, initial encounter: Secondary | ICD-10-CM | POA: Diagnosis not present

## 2022-10-12 DIAGNOSIS — M5416 Radiculopathy, lumbar region: Secondary | ICD-10-CM | POA: Diagnosis not present

## 2022-10-12 DIAGNOSIS — R030 Elevated blood-pressure reading, without diagnosis of hypertension: Secondary | ICD-10-CM | POA: Diagnosis not present

## 2022-10-30 ENCOUNTER — Ambulatory Visit (HOSPITAL_COMMUNITY)
Admission: RE | Admit: 2022-10-30 | Discharge: 2022-10-30 | Disposition: A | Discharge status: Home / Self Care | Payer: Medicare HMO | Source: Ambulatory Visit | Attending: Internal Medicine | Admitting: Internal Medicine

## 2022-10-30 DIAGNOSIS — F17210 Nicotine dependence, cigarettes, uncomplicated: Secondary | ICD-10-CM

## 2022-10-30 DIAGNOSIS — Z87891 Personal history of nicotine dependence: Secondary | ICD-10-CM

## 2022-10-30 DIAGNOSIS — Z122 Encounter for screening for malignant neoplasm of respiratory organs: Secondary | ICD-10-CM

## 2022-11-06 ENCOUNTER — Other Ambulatory Visit: Payer: Self-pay

## 2022-11-06 MED ORDER — ALBUTEROL SULFATE (2.5 MG/3ML) 0.083% IN NEBU: 2.5000 mg | INHALATION_SOLUTION | Freq: Four times a day (QID) | RESPIRATORY_TRACT | 12 refills | Status: DC | PRN

## 2023-01-01 ENCOUNTER — Emergency Department (HOSPITAL_COMMUNITY): Payer: Medicare HMO

## 2023-01-01 ENCOUNTER — Other Ambulatory Visit: Payer: Self-pay

## 2023-01-01 ENCOUNTER — Inpatient Hospital Stay (HOSPITAL_COMMUNITY)
Admission: EM | Admit: 2023-01-01 | Discharge: 2023-01-05 | DRG: 178 | Disposition: A | Discharge status: Home / Self Care | Payer: Medicare HMO | Attending: Internal Medicine | Admitting: Internal Medicine

## 2023-01-01 ENCOUNTER — Encounter (HOSPITAL_COMMUNITY): Payer: Self-pay

## 2023-01-01 DIAGNOSIS — U071 COVID-19: Principal | ICD-10-CM | POA: Diagnosis present

## 2023-01-01 DIAGNOSIS — M5416 Radiculopathy, lumbar region: Secondary | ICD-10-CM | POA: Diagnosis present

## 2023-01-01 DIAGNOSIS — D72829 Elevated white blood cell count, unspecified: Secondary | ICD-10-CM | POA: Diagnosis present

## 2023-01-01 DIAGNOSIS — G8929 Other chronic pain: Secondary | ICD-10-CM | POA: Diagnosis present

## 2023-01-01 DIAGNOSIS — J441 Chronic obstructive pulmonary disease with (acute) exacerbation: Secondary | ICD-10-CM

## 2023-01-01 DIAGNOSIS — E785 Hyperlipidemia, unspecified: Secondary | ICD-10-CM | POA: Diagnosis present

## 2023-01-01 DIAGNOSIS — Z7951 Long term (current) use of inhaled steroids: Secondary | ICD-10-CM

## 2023-01-01 DIAGNOSIS — Z888 Allergy status to other drugs, medicaments and biological substances status: Secondary | ICD-10-CM | POA: Diagnosis not present

## 2023-01-01 DIAGNOSIS — I10 Essential (primary) hypertension: Secondary | ICD-10-CM | POA: Diagnosis present

## 2023-01-01 DIAGNOSIS — T380X5A Adverse effect of glucocorticoids and synthetic analogues, initial encounter: Secondary | ICD-10-CM | POA: Diagnosis present

## 2023-01-01 DIAGNOSIS — D6851 Activated protein C resistance: Secondary | ICD-10-CM | POA: Diagnosis present

## 2023-01-01 DIAGNOSIS — R911 Solitary pulmonary nodule: Secondary | ICD-10-CM | POA: Diagnosis present

## 2023-01-01 DIAGNOSIS — M21372 Foot drop, left foot: Secondary | ICD-10-CM | POA: Diagnosis present

## 2023-01-01 DIAGNOSIS — Z833 Family history of diabetes mellitus: Secondary | ICD-10-CM

## 2023-01-01 DIAGNOSIS — Z79899 Other long term (current) drug therapy: Secondary | ICD-10-CM

## 2023-01-01 DIAGNOSIS — F17210 Nicotine dependence, cigarettes, uncomplicated: Secondary | ICD-10-CM | POA: Diagnosis present

## 2023-01-01 DIAGNOSIS — Z832 Family history of diseases of the blood and blood-forming organs and certain disorders involving the immune mechanism: Secondary | ICD-10-CM | POA: Diagnosis not present

## 2023-01-01 DIAGNOSIS — Z720 Tobacco use: Secondary | ICD-10-CM | POA: Diagnosis present

## 2023-01-01 DIAGNOSIS — Z825 Family history of asthma and other chronic lower respiratory diseases: Secondary | ICD-10-CM

## 2023-01-01 LAB — CBC
HCT: 39.1 % (ref 39.0–52.0)
Hemoglobin: 12.7 g/dL — ABNORMAL LOW (ref 13.0–17.0)
MCH: 29.8 pg (ref 26.0–34.0)
MCHC: 32.5 g/dL (ref 30.0–36.0)
MCV: 91.8 fL (ref 80.0–100.0)
Platelets: 289 10*3/uL (ref 150–400)
RBC: 4.26 MIL/uL (ref 4.22–5.81)
RDW: 12.6 % (ref 11.5–15.5)
WBC: 11 10*3/uL — ABNORMAL HIGH (ref 4.0–10.5)
nRBC: 0 % (ref 0.0–0.2)

## 2023-01-01 LAB — BASIC METABOLIC PANEL
Anion gap: 9 (ref 5–15)
BUN: 6 mg/dL — ABNORMAL LOW (ref 8–23)
CO2: 26 mmol/L (ref 22–32)
Calcium: 8.9 mg/dL (ref 8.9–10.3)
Chloride: 100 mmol/L (ref 98–111)
Creatinine, Ser: 0.96 mg/dL (ref 0.61–1.24)
GFR, Estimated: >60 mL/min (ref >60)
Glucose, Bld: 113 mg/dL — ABNORMAL HIGH (ref 70–99)
Potassium: 4.1 mmol/L (ref 3.5–5.1)
Sodium: 135 mmol/L (ref 135–145)

## 2023-01-01 LAB — RESP PANEL BY RT-PCR (RSV, FLU A&B, COVID)  RVPGX2
Influenza A by PCR: NEGATIVE
Influenza B by PCR: NEGATIVE
Resp Syncytial Virus by PCR: NEGATIVE
SARS Coronavirus 2 by RT PCR: POSITIVE — AB

## 2023-01-01 MED ORDER — ONDANSETRON HCL 4 MG/2ML IJ SOLN: 4.0000 mg | Freq: Four times a day (QID) | INTRAMUSCULAR | Status: DC | PRN

## 2023-01-01 MED ORDER — IPRATROPIUM-ALBUTEROL 0.5-2.5 (3) MG/3ML IN SOLN
3.0000 mL | Freq: Once | RESPIRATORY_TRACT | Status: AC
  Administered 2023-01-01: 3 mL via RESPIRATORY_TRACT
  Filled 2023-01-01: qty 3

## 2023-01-01 MED ORDER — METHYLPREDNISOLONE SODIUM SUCC 40 MG IJ SOLR
40.0000 mg | Freq: Two times a day (BID) | INTRAMUSCULAR | Status: AC
  Administered 2023-01-01 – 2023-01-02 (×3): 40 mg via INTRAVENOUS
  Filled 2023-01-01 (×3): qty 1

## 2023-01-01 MED ORDER — METHYLPREDNISOLONE SODIUM SUCC 125 MG IJ SOLR
125.0000 mg | Freq: Once | INTRAMUSCULAR | Status: AC
  Administered 2023-01-01: 125 mg via INTRAVENOUS
  Filled 2023-01-01: qty 2

## 2023-01-01 MED ORDER — BUDESONIDE 0.25 MG/2ML IN SUSP
0.2500 mg | Freq: Two times a day (BID) | RESPIRATORY_TRACT | Status: DC
  Administered 2023-01-02 – 2023-01-05 (×7): 0.25 mg via RESPIRATORY_TRACT
  Filled 2023-01-01 (×8): qty 2

## 2023-01-01 MED ORDER — TIZANIDINE HCL 4 MG PO TABS: 4.0000 mg | ORAL_TABLET | Freq: Three times a day (TID) | ORAL | Status: DC | PRN

## 2023-01-01 MED ORDER — HYDROXYZINE HCL 10 MG PO TABS: 10.0000 mg | ORAL_TABLET | Freq: Three times a day (TID) | ORAL | Status: DC | PRN

## 2023-01-01 MED ORDER — AMLODIPINE BESYLATE 10 MG PO TABS
10.0000 mg | ORAL_TABLET | Freq: Every day | ORAL | Status: DC
  Administered 2023-01-01 – 2023-01-04 (×4): 10 mg via ORAL
  Filled 2023-01-01 (×4): qty 1

## 2023-01-01 MED ORDER — OXYCODONE HCL 5 MG PO TABS
10.0000 mg | ORAL_TABLET | Freq: Three times a day (TID) | ORAL | Status: DC | PRN
  Administered 2023-01-02 – 2023-01-04 (×5): 10 mg via ORAL
  Filled 2023-01-01 (×5): qty 2

## 2023-01-01 MED ORDER — ONDANSETRON HCL 4 MG PO TABS: 4.0000 mg | ORAL_TABLET | Freq: Four times a day (QID) | ORAL | Status: DC | PRN

## 2023-01-01 MED ORDER — SENNOSIDES-DOCUSATE SODIUM 8.6-50 MG PO TABS: 1.0000 | ORAL_TABLET | Freq: Every evening | ORAL | Status: DC | PRN

## 2023-01-01 MED ORDER — ARFORMOTEROL TARTRATE 15 MCG/2ML IN NEBU
15.0000 ug | INHALATION_SOLUTION | Freq: Two times a day (BID) | RESPIRATORY_TRACT | Status: DC
  Administered 2023-01-02: 15 ug via RESPIRATORY_TRACT
  Filled 2023-01-01 (×2): qty 2

## 2023-01-01 MED ORDER — ALBUTEROL (5 MG/ML) CONTINUOUS INHALATION SOLN
10.0000 mg/h | INHALATION_SOLUTION | Freq: Once | RESPIRATORY_TRACT | Status: DC
  Filled 2023-01-01: qty 20

## 2023-01-01 MED ORDER — IPRATROPIUM-ALBUTEROL 0.5-2.5 (3) MG/3ML IN SOLN
3.0000 mL | Freq: Four times a day (QID) | RESPIRATORY_TRACT | Status: DC | PRN
  Administered 2023-01-02: 3 mL via RESPIRATORY_TRACT
  Filled 2023-01-01 (×2): qty 3

## 2023-01-01 MED ORDER — ENOXAPARIN SODIUM 40 MG/0.4ML IJ SOSY
40.0000 mg | PREFILLED_SYRINGE | INTRAMUSCULAR | Status: DC
  Administered 2023-01-01 – 2023-01-04 (×4): 40 mg via SUBCUTANEOUS
  Filled 2023-01-01 (×4): qty 0.4

## 2023-01-01 MED ORDER — SODIUM CHLORIDE 0.9% FLUSH
3.0000 mL | Freq: Two times a day (BID) | INTRAVENOUS | Status: DC
  Administered 2023-01-01 – 2023-01-05 (×8): 3 mL via INTRAVENOUS

## 2023-01-01 MED ORDER — GUAIFENESIN ER 600 MG PO TB12
600.0000 mg | ORAL_TABLET | Freq: Two times a day (BID) | ORAL | Status: DC
  Administered 2023-01-01 – 2023-01-02 (×3): 600 mg via ORAL
  Filled 2023-01-01 (×3): qty 1

## 2023-01-01 MED ORDER — ACETAMINOPHEN 325 MG PO TABS: 650.0000 mg | ORAL_TABLET | Freq: Four times a day (QID) | ORAL | Status: DC | PRN

## 2023-01-01 MED ORDER — IOHEXOL 350 MG/ML SOLN
75.0000 mL | Freq: Once | INTRAVENOUS | Status: AC | PRN
  Administered 2023-01-01: 75 mL via INTRAVENOUS

## 2023-01-01 MED ORDER — ACETAMINOPHEN 650 MG RE SUPP: 650.0000 mg | Freq: Four times a day (QID) | RECTAL | Status: DC | PRN

## 2023-01-01 MED ORDER — ALBUTEROL SULFATE (2.5 MG/3ML) 0.083% IN NEBU
10.0000 mg/h | INHALATION_SOLUTION | RESPIRATORY_TRACT | Status: DC
  Administered 2023-01-01 (×2): 10 mg/h via RESPIRATORY_TRACT
  Filled 2023-01-01: qty 3

## 2023-01-01 MED ORDER — ZOLPIDEM TARTRATE 5 MG PO TABS
10.0000 mg | ORAL_TABLET | Freq: Every evening | ORAL | Status: DC | PRN
  Administered 2023-01-02 – 2023-01-05 (×4): 10 mg via ORAL
  Filled 2023-01-01 (×4): qty 2

## 2023-01-01 NOTE — H&P (Signed)
History and Physical    Cody Grant WNU:[id number] DOB: [birth year] DOA: 01/01/2023  PCP: Corwin Levins, MD  Patient coming from: Home  I have personally briefly reviewed patient's old medical records in [facility]  Chief Complaint: Shortness of breath  HPI: Cody Grant is a 62 y.o. male with medical history significant for COPD, HTN, factor V Leiden heterozygosity, chronic lumbar radiculopathy with left foot drop, and tobacco use who presented to the ED for evaluation of shortness of breath.  Patient states over the last 3 days he has had significant shortness of breath with minimal exertion.  He gets to the point where he cannot catch his breath and has been having to use his home nebulizers around-the-clock.  He says 1 month ago he did have URI symptoms with cough, runny nose, and chest congestion.  He felt like he had bronchitis at the time.  Since then most of those symptoms have resolved except for continued issues with feeling like his COPD is flaring up.  He says currently he is not having any fevers, chills, diaphoresis, nausea, vomiting, chest pain, cough.  He does report continued tobacco use, smoking several cigarettes daily but states he was previously smoking 1-2 PPD prior to 1 month ago.  ED Course  Labs/Imaging on admission: I have personally reviewed following labs and imaging studies.  Initial vitals showed BP 131/71, pulse 108, RR 24, temp 97.5 F, SpO2 97% on 2 L O2 via [place].  Labs show WBC 11.0, hemoglobin 12.7, platelets 289,000, sodium 135, potassium 4.1, bicarb 26, BUN 6, creatinine 0.96, serum glucose 113.  SARS-CoV-2 PCR is positive.  Influenza and RSV negative.  CTA chest negative for evidence of PE.  Enlarged right hilar lymph node is nonspecific and may be reactive.  Patient was given IV Solu-Medrol 125 mg, DuoNeb, continuous albuterol nebulizer.  The hospitalist service was consulted to admit for further evaluation and management.  Review of  Systems: All systems reviewed and are negative except as documented in history of present illness above.   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 He has a 7.5 pack-year smoking history. He has never used smokeless tobacco. He reports current alcohol use of about 2.0 - 4.0 standard drinks of alcohol per week. He reports that he does not use drugs.  Allergies  Allergen Reactions   Statins Other (See Comments)    Leg weakness    Family History  Problem Relation Age of Onset   COPD Mother    Diabetes Mother    Pulmonary embolism Father      Prior to Admission medications   Medication Sig Start Date End Date Taking? Authorizing Provider  acetaminophen (TYLENOL) 325 MG tablet Take 650 mg by mouth every 6 (six) hours as needed for headache, fever or moderate pain. Patient not taking: Reported on 09/13/2022    [provider]  albuterol (PROVENTIL) (2.5 MG/3ML) 0.083% nebulizer solution Take 3 mLs (2.5 mg total) by nebulization every 6 (six) hours as needed for wheezing or shortness of breath. 11/06/22   Nyoka Cowden, MD  albuterol (VENTOLIN HFA) 108 (90 Base) MCG/ACT inhaler Inhale 2 puffs into the lungs every 6 (six) hours as needed for wheezing or shortness of breath. 08/25/19   Corwin Levins, MD  amLODipine (NORVASC) 10 MG tablet TAKE 1 TABLET(10 MG) BY MOUTH DAILY 09/13/22   Corwin Levins, MD  ipratropium-albuterol (DUONEB) 0.5-2.5 (3) MG/3ML SOLN  Take 3 mLs by nebulization every 6 (six) hours as needed (wheezing or increased shortness of breath). 04/27/21   Lonia Blood, MD  NARCAN 4 MG/0.1ML LIQD nasal spray kit Place 0.4 mg into the nose as needed (accidental overdose). 11/07/19   [provider]  Oxycodone HCl 10 MG TABS Take 10 mg by mouth 3 (three) times daily as needed (pain). 04/16/21   [provider]  predniSONE (DELTASONE) 10 MG tablet Take  4 each am x 2 days,   2 each am x 2 days,  1 each am x 2 days and stop Patient not taking: Reported on 09/19/2022 06/23/22   Nyoka Cowden, MD  Tiotropium Bromide-Olodaterol (STIOLTO RESPIMAT) 2.5-2.5 MCG/ACT AERS Inhale 2 puffs into the lungs daily. 06/15/22   Nyoka Cowden, MD  tiZANidine (ZANAFLEX) 4 MG tablet TAKE 1 TABLET(4 MG) BY MOUTH EVERY 8 HOURS AS NEEDED FOR MUSCLE SPASMS 03/14/16   Judi Saa, DO  zolpidem (AMBIEN) 10 MG tablet TAKE 1 TABLET(10 MG) BY MOUTH AT BEDTIME AS NEEDED FOR SLEEP 09/13/22   Corwin Levins, MD    Physical Exam: Vitals:   01/01/23 1530 01/01/23 1630 01/01/23 1747 01/01/23 1900  BP: 138/84 105/63  135/79  Pulse: 97 96  (!) 106  Resp: 15 20  (!) 24  Temp:      SpO2: 100% 99% 98% 97%  Weight:      Height:       Constitutional: Sitting up in bed, NAD, calm Eyes: EOMI, lids and conjunctivae normal ENMT: Mucous membranes are moist. Posterior pharynx clear of any exudate or lesions.Normal dentition.  Neck: normal, supple, no masses. Respiratory: Coarse expiratory wheezing throughout the lung field.  Slightly increased respiratory effort. No accessory muscle use.  Cardiovascular: Tachycardic, no murmurs / rubs / gallops. No extremity edema. 2+ pedal pulses. Abdomen: no tenderness, no masses palpated.  Musculoskeletal: no clubbing / cyanosis. No joint deformity upper and lower extremities. Good ROM, no contractures. Normal muscle tone.  Skin: no rashes, lesions, ulcers. No induration Neurologic: Sensation intact. Strength 5/5 in  all 4.  Psychiatric: Normal judgment and insight. Alert and oriented x 3. Normal mood.   EKG: Personally reviewed. Sinus tachycardia, rate 106, no acute ischemic changes.  Similar to previous.  Assessment/Plan Principal Problem:   COPD with acute exacerbation (HCC) Active Problems:   Essential hypertension   Tobacco use   Cody Grant is a 62 y.o. male with medical history significant for COPD, HTN, factor V Leiden heterozygosity, chronic lumbar radiculopathy with left foot drop, and tobacco use who is admitted with an acute COPD exacerbation triggered by COVID-19 viral infection.  Assessment and Plan: Acute COPD exacerbation triggered by COVID-19 viral infection: Still with significant wheezing throughout the lungs despite initial nebulizers and steroids.  CTA chest is negative for PE, pneumonia, or infiltrates.  Currently saturating well on room air while at rest. -Start IV Solu-Medrol  40 mg twice daily -Scheduled Brovana/Pulmicort with DuoNebs as needed -Supplemental O2 as needed  Hypertension: Continue amlodipine.  Tobacco use: Patient admits to continued tobacco use.  Previously smoking 1-2 PPD but over the last month down to a few cigarettes daily.  Smoking cessation advised.  Patient declines nicotine patch.  Chronic pain: Continue home oxycodone.   DVT prophylaxis: enoxaparin (LOVENOX) injection 40 mg Start: 01/01/23 2200 Code Status: Full code, confirmed with patient on admission Family Communication: Spouse at bedside Disposition Plan: From home, dispo pending clinical progress Consults called: None Severity of Illness: The appropriate patient status for this patient is INPATIENT. Inpatient status is judged to be reasonable and necessary in order to provide the required intensity of service to ensure the patient's safety. The patient's presenting symptoms, physical exam findings, and initial radiographic and laboratory data in the context of their chronic  comorbidities is felt to place them at high risk for further clinical deterioration. Furthermore, it is not anticipated that the patient will be medically stable for discharge from the hospital within 2 midnights of admission.   * I certify that at the point of admission it is my clinical judgment that the patient will require inpatient hospital care spanning beyond 2 midnights from the point of admission due to high intensity of service, high risk for further deterioration and high frequency of surveillance required.Darreld Mclean MD [facility]  If 7PM-7AM, please contact night-coverage [email or web address]  01/01/2023, 8:28 PM

## 2023-01-01 NOTE — Hospital Course (Signed)
Cody Grant is a 62 y.o. male with medical history significant for COPD, HTN, factor V Leiden heterozygosity, chronic lumbar radiculopathy with left foot drop, and tobacco use who is admitted with an acute COPD exacerbation triggered by COVID-19 viral infection.

## 2023-01-01 NOTE — ED Provider Notes (Signed)
EMERGENCY DEPARTMENT AT [facility] Provider Note   CSN: [id number] Arrival date & time: 01/01/23  1404     History  Chief Complaint  Patient presents with   Shortness of Breath    Cody Grant is a 62 y.o. male with history of asthma, factor V Leiden not on anticoagulation, chronic back pain, COPD, hypertension, hyperlipidemia, tobacco use, who presents to the emergency department complaining of shortness of breath.  Patient states that while he has had worsening shortness of breath going on for some time now, likely over a month, it has been much worse in the past 2 days.  He states it is worse anytime he has to transfer or get up, as well as being out in the humid hot air.  He has been using his inhaler and nebulizer treatments "constantly", with no relief.  Minimal dry cough although he does feel congested in his chest.  Not complaining of any pain.  Last used his inhaler upon arrival to the ER, last used his nebulizer earlier this morning. No fever or chills.  States he checked his O2 saturation at home and it was 89%.   Shortness of Breath Associated symptoms: cough and wheezing        Home Medications Prior to Admission medications   Medication Sig Start Date End Date Taking? Authorizing Provider  acetaminophen (TYLENOL) 325 MG tablet Take 650 mg by mouth every 6 (six) hours as needed for headache, fever or moderate pain. Patient not taking: Reported on 09/13/2022    [provider]  albuterol (PROVENTIL) (2.5 MG/3ML) 0.083% nebulizer solution Take 3 mLs (2.5 mg total) by nebulization every 6 (six) hours as needed for wheezing or shortness of breath. 11/06/22   Nyoka Cowden, MD  albuterol (VENTOLIN HFA) 108 (90 Base) MCG/ACT inhaler Inhale 2 puffs into the lungs every 6 (six) hours as needed for wheezing or shortness of breath. 08/25/19   Corwin Levins, MD  amLODipine (NORVASC) 10 MG tablet TAKE 1 TABLET(10 MG) BY MOUTH DAILY 09/13/22   Corwin Levins, MD  ipratropium-albuterol (DUONEB) 0.5-2.5 (3) MG/3ML SOLN Take 3 mLs by nebulization every 6 (six) hours as needed (wheezing or increased shortness of breath). 04/27/21   Lonia Blood, MD  NARCAN 4 MG/0.1ML LIQD nasal spray kit Place 0.4 mg into the nose as needed (accidental overdose). 11/07/19   [provider]  Oxycodone HCl 10 MG TABS Take 10 mg by mouth 3 (three) times daily as needed (pain). 04/16/21   [provider]  predniSONE (DELTASONE) 10 MG tablet Take  4 each am x 2 days,   2 each am x 2 days,  1 each am x 2 days and stop Patient not taking: Reported on 09/19/2022 06/23/22   Nyoka Cowden, MD  Tiotropium Bromide-Olodaterol (STIOLTO RESPIMAT) 2.5-2.5 MCG/ACT AERS Inhale 2 puffs into the lungs daily. 06/15/22   Nyoka Cowden, MD  tiZANidine (ZANAFLEX) 4 MG tablet TAKE 1 TABLET(4 MG) BY MOUTH EVERY 8 HOURS AS NEEDED FOR MUSCLE SPASMS 03/14/16   Judi Saa, DO  zolpidem (AMBIEN) 10 MG tablet TAKE 1 TABLET(10 MG) BY MOUTH AT BEDTIME AS NEEDED FOR SLEEP 09/13/22   Corwin Levins, MD      Allergies    Statins    Review of Systems   Review of Systems  Respiratory:  Positive for cough, shortness of breath and wheezing.   All other systems reviewed and are negative.   Physical Exam  Updated Vital Signs BP 105/63   Pulse 96   Temp 98.1 F (36.7 C)   Resp 20   Ht 5\' 8"  (1.727 m)   Wt 68 kg   SpO2 98%   BMI 22.81 kg/m  Physical Exam Vitals and nursing note reviewed.  Constitutional:      Appearance: Normal appearance.  HENT:     Head: Normocephalic and atraumatic.  Eyes:     Conjunctiva/sclera: Conjunctivae normal.  Cardiovascular:     Rate and Rhythm: Normal rate and regular rhythm.  Pulmonary:     Effort: Tachypnea present. No respiratory distress.     Breath sounds: Wheezing present.     Comments: Increased respiratory effort and tachypnea.  Inspiratory and expiratory wheezing in all lung fields. Abdominal:     General: There is no  distension.     Palpations: Abdomen is soft.     Tenderness: There is no abdominal tenderness.  Musculoskeletal:     Right lower leg: No edema.     Left lower leg: No edema.  Skin:    General: Skin is warm and dry.  Neurological:     General: No focal deficit present.     Mental Status: He is alert.     ED Results / Procedures / Treatments   Labs (all labs ordered are listed, but only abnormal results are displayed) Labs Reviewed  RESP PANEL BY RT-PCR (RSV, FLU A&B, COVID)  [id number] - Abnormal; Notable for the following components:      Result Value   SARS Coronavirus 2 by RT PCR POSITIVE (*)    All other components within normal limits  BASIC METABOLIC PANEL - Abnormal; Notable for the following components:   Glucose, Bld 113 (*)    BUN 6 (*)    All other components within normal limits  CBC - Abnormal; Notable for the following components:   WBC 11.0 (*)    Hemoglobin 12.7 (*)    All other components within normal limits    EKG EKG Interpretation Date/Time:  Monday January 01 2023 14:39:48 EDT Ventricular Rate:  106 PR Interval:  186 QRS Duration:  92 QT Interval:  329 QTC Calculation: 437 R Axis:   101  Text Interpretation: Sinus tachycardia Right axis deviation No significant change since last tracing Confirmed by Elayne Snare ([id number]) on 01/01/2023 4:17:57 PM  Radiology CT Angio Chest PE W and/or Wo Contrast  Result Date: 01/01/2023 CLINICAL DATA:  Shortness of breath, concern for pulmonary embolism. EXAM: CT ANGIOGRAPHY CHEST WITH CONTRAST TECHNIQUE: Multidetector CT imaging of the chest was performed using the standard protocol during bolus administration of intravenous contrast. Multiplanar CT image reconstructions and MIPs were obtained to evaluate the vascular anatomy. RADIATION DOSE REDUCTION: This exam was performed according to the departmental dose-optimization program which includes automated exposure control, adjustment of the mA and/or kV according to  patient size and/or use of iterative reconstruction technique. CONTRAST:  75mL OMNIPAQUE IOHEXOL 350 MG/ML SOLN COMPARISON:  Same day chest radiograph and chest CT dated 10/30/2022. FINDINGS: Cardiovascular: Satisfactory opacification of the pulmonary arteries to the segmental level. No evidence of pulmonary embolism. Vascular calcifications are seen in the coronary arteries and aortic arch. Normal heart size. No pericardial effusion. Mediastinum/Nodes: An enlarged right hilar lymph node measures 10 mm in short axis. No enlarged mediastinal, axillary, or left hilar lymph nodes. Thyroid gland, trachea, and esophagus demonstrate no significant findings. Lungs/Pleura: Centrilobular emphysema is redemonstrated. A 4 mm pulmonary nodule in the right upper lobe appears  unchanged since 10/30/2022. A subpleural nodular opacity in the lateral left upper lobe appears unchanged since 10/26/2021. No pleural effusion or pneumothorax. Upper Abdomen: No acute abnormality. Musculoskeletal: No chest wall abnormality. No acute or significant osseous findings. Review of the MIP images confirms the above findings. IMPRESSION: 1. No evidence of pulmonary embolism. 2. Enlarged right hilar lymph node is nonspecific and may be reactive. 3. Per prior report dated 10/30/2022, short-term follow-up in 6 months is recommended with repeat low-dose chest CT without contrast (please use the following order, CT CHEST LCS NODULE FOLLOW-UP W/O CM). Aortic Atherosclerosis (ICD10-I70.0) and Emphysema (ICD10-J43.9). Electronically Signed   By: Romona Curls M.D.   On: 01/01/2023 17:56   DG Chest 2 View  Result Date: 01/01/2023 CLINICAL DATA:  COPD EXAM: CHEST - 2 VIEW COMPARISON:  04/25/2021 FINDINGS: Stable coarse somewhat attenuated bronchovascular markings and borderline hyperinflation. No focal infiltrate. Heart size and mediastinal contours are within normal limits. No effusion. Visualized bones unremarkable. IMPRESSION: Borderline  hyperinflation. No acute findings. Electronically Signed   By: Corlis Leak M.D.   On: 01/01/2023 16:15    Procedures Procedures    Medications Ordered in ED Medications  albuterol (PROVENTIL) (2.5 MG/3ML) 0.083% nebulizer solution (0 mg/hr Nebulization Stopped 01/01/23 1818)  ipratropium-albuterol (DUONEB) 0.5-2.5 (3) MG/3ML nebulizer solution 3 mL (3 mLs Nebulization Given 01/01/23 1514)  methylPREDNISolone sodium succinate (SOLU-MEDROL) 125 mg/2 mL injection 125 mg (125 mg Intravenous Given 01/01/23 1514)  iohexol (OMNIPAQUE) 350 MG/ML injection 75 mL (75 mLs Intravenous Contrast Given 01/01/23 1654)    ED Course/ Medical Decision Making/ A&P                                 Medical Decision Making Amount and/or Complexity of Data Reviewed Labs: ordered. Radiology: ordered.  Risk Prescription drug management. Decision regarding hospitalization.   This patient is a 62 y.o. male  who presents to the ED for concern of shortness of breath.   Differential diagnoses prior to evaluation: The emergent differential diagnosis includes, but is not limited to,  CHF, pericardial effusion/tamponade, arrhythmias, ACS, COPD, asthma, bronchitis, pneumonia, pneumothorax, PE, anemia. This is not an exhaustive differential.   Past Medical History / Co-morbidities / Social History: asthma, factor V Leiden not on anticoagulation, chronic back pain, COPD, hypertension, hyperlipidemia, tobacco use  Additional history: Chart reviewed. Pertinent results include: Last visit/admission for COPD in December 2022. Follows with [facility].   Physical Exam: Physical exam performed. The pertinent findings include: Tachycardic.  Maintaining normal O2 sat while resting, placed on 2 L for increased respiratory effort.  Significant inspiratory and expiratory wheezing in all lung fields.  No peripheral edema.  Lab Tests/Imaging studies: I personally interpreted labs/imaging and the pertinent results include:  Leukocytosis of 11, hemoglobin stable.  BMP unremarkable. Respiratory panel positive for COVID-19.   Chest X-ray with hyperinflation, no acute findings. CT PE study with no evidence of PE. I agree with the radiologist interpretation.  Cardiac monitoring: EKG obtained and interpreted by myself and attending physician which shows: sinus tachycardia with rate of 106 bpm   Medications: I ordered medication including duoneb, solumedrol, continuous neb.  I have reviewed the patients home medicines and have made adjustments as needed.  Consultations obtained: I consulted with hospitalist Dr Allena Katz who will admit.    Disposition: After consideration of the diagnostic results and the patients response to treatment, I feel that patient would benefit from admission for  COPD exacerbation likely due to COVID-19 viral infection. Not benefiting from breathing treatments or steroids in ED.   Final Clinical Impression(s) / ED Diagnoses Final diagnoses:  COPD exacerbation (HCC)  COVID-19    Rx / DC Orders ED Discharge Orders     None      Portions of this report may have been transcribed using voice recognition software. Every effort was made to ensure accuracy; however, inadvertent computerized transcription errors may be present.    Jeanella Flattery 01/01/23 2043    Tegeler, Canary Brim, MD 01/04/23 [phone]

## 2023-01-01 NOTE — ED Notes (Signed)
Report given to 2W RN, Trinna Post. Care transferred

## 2023-01-01 NOTE — ED Notes (Signed)
ED TO INPATIENT HANDOFF REPORT  ED Nurse Name and Phone #: Scheryl Marten RN, [phone]  S Name/Age/Gender Cody Grant 62 y.o. male Room/Bed: 038C/038C  Code Status   Code Status: Full Code  Home/SNF/Other Home Patient oriented to: self, place, time, and situation Is this baseline? Yes   Triage Complete: Triage complete  Chief Complaint COPD with acute exacerbation (HCC) [J44.1]  Triage Note Pt reports worsening SOB over the past 2 days, using inhaler and neb treatments without relief. Pt has hx of COPD   Allergies Allergies  Allergen Reactions   Statins Other (See Comments)    Leg weakness    Level of Care/Admitting Diagnosis ED Disposition     ED Disposition  Admit   Condition  --   Comment  Hospital Area: [facility] [[id number]]  Level of Care: Telemetry Medical [[id number]]  May admit patient to [facility] or [facility] if equivalent level of care is available:: No  Covid Evaluation: Confirmed COVID Positive  Diagnosis: COPD with acute exacerbation [facility]) [[id number]]  Admitting Physician: Charlsie Quest [[id number]]  Attending Physician: Charlsie Quest [[id number]]  Certification:: I certify this patient will need inpatient services for at least 2 midnights  Expected Medical Readiness: 01/03/2023          B Medical/Surgery History Past Medical History:  Diagnosis Date   Allergic rhinitis, cause unspecified    Asthma    as child   Chronic lumbar radiculopathy    left with left drop foot/numbness   Chronic pain    to see Dr Penni Homans   COPD, severe (HCC) 06/22/2013   Factor 5 Leiden mutation, heterozygous (HCC)    HTN (hypertension) 08/25/2019   Left foot drop    Lumbar disc disease    Other and unspecified hyperlipidemia 05/20/2013   Past Surgical History:  Procedure Laterality Date   SPINE SURGERY  12/2010   micro diskectomy     A IV Location/Drains/Wounds Patient Lines/Drains/Airways Status     Active Line/Drains/Airways     Name  Placement date Placement time Site Days   Peripheral IV 01/01/23 20 G 1" Right Antecubital 01/01/23  1513  Antecubital  less than 1            Intake/Output Last 24 hours No intake or output data in the 24 hours ending 01/01/23 2032  Labs/Imaging Results for orders placed or performed during the hospital encounter of 01/01/23 (from the past 48 hour(s))  Basic metabolic panel     Status: Abnormal   Collection Time: 01/01/23  3:14 PM  Result Value Ref Range   Sodium 135 135 - 145 mmol/L   Potassium 4.1 3.5 - 5.1 mmol/L   Chloride 100 98 - 111 mmol/L   CO2 26 22 - 32 mmol/L   Glucose, Bld 113 (H) 70 - 99 mg/dL    Comment: Glucose reference range applies only to samples taken after fasting for at least 8 hours.   BUN 6 (L) 8 - 23 mg/dL   Creatinine, Ser 5.28 0.61 - 1.24 mg/dL   Calcium 8.9 8.9 - 41.3 mg/dL   GFR, Estimated >24 >40 mL/min    Comment: (NOTE) Calculated using the CKD-EPI Creatinine Equation (2021)    Anion gap 9 5 - 15    Comment: Performed at [facility], [address] [place], [place] [address]  CBC     Status: Abnormal   Collection Time: 01/01/23  3:14 PM  Result Value Ref Range   WBC  11.0 (H) 4.0 - 10.5 K/uL   RBC 4.26 4.22 - 5.81 MIL/uL   Hemoglobin 12.7 (L) 13.0 - 17.0 g/dL   HCT 30.8 65.7 - 84.6 %   MCV 91.8 80.0 - 100.0 fL   MCH 29.8 26.0 - 34.0 pg   MCHC 32.5 30.0 - 36.0 g/dL   RDW 96.2 95.2 - 84.1 %   Platelets 289 150 - 400 K/uL   nRBC 0.0 0.0 - 0.2 %    Comment: Performed at [facility], [address] [place], [place] [address]  Resp panel by RT-PCR (RSV, Flu A&B, Covid) Anterior Nasal Swab     Status: Abnormal   Collection Time: 01/01/23  4:20 PM   Specimen: Anterior Nasal Swab  Result Value Ref Range   SARS Coronavirus 2 by RT PCR POSITIVE (A) NEGATIVE   Influenza A by PCR NEGATIVE NEGATIVE   Influenza B by PCR NEGATIVE NEGATIVE    Comment: (NOTE) The Xpert Xpress SARS-CoV-2/FLU/RSV plus assay is intended as an aid in  the diagnosis of influenza from Nasopharyngeal swab specimens and should not be used as a sole basis for treatment. Nasal washings and aspirates are unacceptable for Xpert Xpress SARS-CoV-2/FLU/RSV testing.  Fact Sheet for Patients: BloggerCourse.com  Fact Sheet for Healthcare Providers: SeriousBroker.it  This test is not yet approved or cleared by the [place] FDA and has been authorized for detection and/or diagnosis of SARS-CoV-2 by FDA under an Emergency Use Authorization (EUA). This EUA will remain in effect (meaning this test can be used) for the duration of the COVID-19 declaration under Section 564(b)(1) of the Act, 21 U.S.C. section 360bbb-3(b)(1), unless the authorization is terminated or revoked.     Resp Syncytial Virus by PCR NEGATIVE NEGATIVE    Comment: (NOTE) Fact Sheet for Patients: BloggerCourse.com  Fact Sheet for Healthcare Providers: SeriousBroker.it  This test is not yet approved or cleared by the [place] FDA and has been authorized for detection and/or diagnosis of SARS-CoV-2 by FDA under an Emergency Use Authorization (EUA). This EUA will remain in effect (meaning this test can be used) for the duration of the COVID-19 declaration under Section 564(b)(1) of the Act, 21 U.S.C. section 360bbb-3(b)(1), unless the authorization is terminated or revoked.  Performed at [facility], [address] [place], [place] [address]    CT Angio Chest PE W and/or Wo Contrast  Result Date: 01/01/2023 CLINICAL DATA:  Shortness of breath, concern for pulmonary embolism. EXAM: CT ANGIOGRAPHY CHEST WITH CONTRAST TECHNIQUE: Multidetector CT imaging of the chest was performed using the standard protocol during bolus administration of intravenous contrast. Multiplanar CT image reconstructions and MIPs were obtained to evaluate the vascular anatomy. RADIATION  DOSE REDUCTION: This exam was performed according to the departmental dose-optimization program which includes automated exposure control, adjustment of the mA and/or kV according to patient size and/or use of iterative reconstruction technique. CONTRAST:  75mL OMNIPAQUE IOHEXOL 350 MG/ML SOLN COMPARISON:  Same day chest radiograph and chest CT dated 10/30/2022. FINDINGS: Cardiovascular: Satisfactory opacification of the pulmonary arteries to the segmental level. No evidence of pulmonary embolism. Vascular calcifications are seen in the coronary arteries and aortic arch. Normal heart size. No pericardial effusion. Mediastinum/Nodes: An enlarged right hilar lymph node measures 10 mm in short axis. No enlarged mediastinal, axillary, or left hilar lymph nodes. Thyroid gland, trachea, and esophagus demonstrate no significant findings. Lungs/Pleura: Centrilobular emphysema is redemonstrated. A 4 mm pulmonary nodule in the right upper lobe appears unchanged since 10/30/2022.  A subpleural nodular opacity in the lateral left upper lobe appears unchanged since 10/26/2021. No pleural effusion or pneumothorax. Upper Abdomen: No acute abnormality. Musculoskeletal: No chest wall abnormality. No acute or significant osseous findings. Review of the MIP images confirms the above findings. IMPRESSION: 1. No evidence of pulmonary embolism. 2. Enlarged right hilar lymph node is nonspecific and may be reactive. 3. Per prior report dated 10/30/2022, short-term follow-up in 6 months is recommended with repeat low-dose chest CT without contrast (please use the following order, CT CHEST LCS NODULE FOLLOW-UP W/O CM). Aortic Atherosclerosis (ICD10-I70.0) and Emphysema (ICD10-J43.9). Electronically Signed   By: Romona Curls M.D.   On: 01/01/2023 17:56   DG Chest 2 View  Result Date: 01/01/2023 CLINICAL DATA:  COPD EXAM: CHEST - 2 VIEW COMPARISON:  04/25/2021 FINDINGS: Stable coarse somewhat attenuated bronchovascular markings and  borderline hyperinflation. No focal infiltrate. Heart size and mediastinal contours are within normal limits. No effusion. Visualized bones unremarkable. IMPRESSION: Borderline hyperinflation. No acute findings. Electronically Signed   By: Corlis Leak M.D.   On: 01/01/2023 16:15    Pending Labs Unresulted Labs (From admission, onward)     Start     Ordered   01/02/23 0500  HIV Antibody (routine testing w rflx)  (HIV Antibody (Routine testing w reflex) panel)  Tomorrow morning,   R        01/01/23 2024   01/02/23 0500  Basic metabolic panel  Tomorrow morning,   R        01/01/23 2024   01/02/23 0500  CBC  Tomorrow morning,   R        01/01/23 2024            Vitals/Pain Today's Vitals   01/01/23 1530 01/01/23 1630 01/01/23 1747 01/01/23 1900  BP: 138/84 105/63  135/79  Pulse: 97 96  (!) 106  Resp: 15 20  (!) 24  Temp:    98.7 F (37.1 C)  TempSrc:    Oral  SpO2: 100% 99% 98% 97%  Weight:      Height:      PainSc:        Isolation Precautions Airborne and Contact precautions  Medications Medications  enoxaparin (LOVENOX) injection 40 mg (has no administration in time range)  sodium chloride flush (NS) 0.9 % injection 3 mL (has no administration in time range)  methylPREDNISolone sodium succinate (SOLU-MEDROL) 40 mg/mL injection 40 mg (has no administration in time range)  arformoterol (BROVANA) nebulizer solution 15 mcg (has no administration in time range)  budesonide (PULMICORT) nebulizer solution 0.25 mg (has no administration in time range)  ipratropium-albuterol (DUONEB) 0.5-2.5 (3) MG/3ML nebulizer solution 3 mL (has no administration in time range)  acetaminophen (TYLENOL) tablet 650 mg (has no administration in time range)    Or  acetaminophen (TYLENOL) suppository 650 mg (has no administration in time range)  ondansetron (ZOFRAN) tablet 4 mg (has no administration in time range)    Or  ondansetron (ZOFRAN) injection 4 mg (has no administration in time range)   senna-docusate (Senokot-S) tablet 1 tablet (has no administration in time range)  hydrOXYzine (ATARAX) tablet 10 mg (has no administration in time range)  guaiFENesin (MUCINEX) 12 hr tablet 600 mg (has no administration in time range)  amLODipine (NORVASC) tablet 10 mg (has no administration in time range)  Oxycodone HCl TABS 10 mg (has no administration in time range)  tiZANidine (ZANAFLEX) tablet 4 mg (has no administration in time range)  zolpidem (AMBIEN) tablet 10  mg (has no administration in time range)  ipratropium-albuterol (DUONEB) 0.5-2.5 (3) MG/3ML nebulizer solution 3 mL (3 mLs Nebulization Given 01/01/23 1514)  methylPREDNISolone sodium succinate (SOLU-MEDROL) 125 mg/2 mL injection 125 mg (125 mg Intravenous Given 01/01/23 1514)  iohexol (OMNIPAQUE) 350 MG/ML injection 75 mL (75 mLs Intravenous Contrast Given 01/01/23 1654)    Mobility walks     Focused Assessments Pulmonary Assessment Handoff:  Lung sounds: Bilateral Breath Sounds: Expiratory wheezes O2 Device: Nasal Cannula O2 Flow Rate (L/min): 2 L/min    R Recommendations: See Admitting Provider Note  Report given to:   Additional Notes: n/a

## 2023-01-01 NOTE — ED Triage Notes (Signed)
Pt reports worsening SOB over the past 2 days, using inhaler and neb treatments without relief. Pt has hx of COPD

## 2023-01-02 DIAGNOSIS — J441 Chronic obstructive pulmonary disease with (acute) exacerbation: Secondary | ICD-10-CM | POA: Diagnosis not present

## 2023-01-02 LAB — CBC
HCT: 38 % — ABNORMAL LOW (ref 39.0–52.0)
Hemoglobin: 12.8 g/dL — ABNORMAL LOW (ref 13.0–17.0)
MCH: 30.5 pg (ref 26.0–34.0)
MCHC: 33.7 g/dL (ref 30.0–36.0)
MCV: 90.5 fL (ref 80.0–100.0)
Platelets: 271 10*3/uL (ref 150–400)
RBC: 4.2 MIL/uL — ABNORMAL LOW (ref 4.22–5.81)
RDW: 12.7 % (ref 11.5–15.5)
WBC: 11.9 10*3/uL — ABNORMAL HIGH (ref 4.0–10.5)
nRBC: 0 % (ref 0.0–0.2)

## 2023-01-02 LAB — BASIC METABOLIC PANEL
Anion gap: 11 (ref 5–15)
BUN: 8 mg/dL (ref 8–23)
CO2: 26 mmol/L (ref 22–32)
Calcium: 9.5 mg/dL (ref 8.9–10.3)
Chloride: 101 mmol/L (ref 98–111)
Creatinine, Ser: 0.78 mg/dL (ref 0.61–1.24)
GFR, Estimated: >60 mL/min (ref >60)
Glucose, Bld: 136 mg/dL — ABNORMAL HIGH (ref 70–99)
Potassium: 4 mmol/L (ref 3.5–5.1)
Sodium: 138 mmol/L (ref 135–145)

## 2023-01-02 LAB — HIV ANTIBODY (ROUTINE TESTING W REFLEX): HIV Screen 4th Generation wRfx: NONREACTIVE

## 2023-01-02 MED ORDER — ACETAMINOPHEN 325 MG PO TABS: 650.0000 mg | ORAL_TABLET | Freq: Four times a day (QID) | ORAL | Status: DC | PRN

## 2023-01-02 MED ORDER — IPRATROPIUM-ALBUTEROL 0.5-2.5 (3) MG/3ML IN SOLN
3.0000 mL | Freq: Four times a day (QID) | RESPIRATORY_TRACT | Status: DC
  Administered 2023-01-02 – 2023-01-04 (×5): 3 mL via RESPIRATORY_TRACT
  Filled 2023-01-02 (×7): qty 3

## 2023-01-02 MED ORDER — PREDNISONE 20 MG PO TABS
40.0000 mg | ORAL_TABLET | Freq: Every day | ORAL | Status: DC
  Administered 2023-01-03: 40 mg via ORAL
  Filled 2023-01-02: qty 2

## 2023-01-02 NOTE — Plan of Care (Signed)

## 2023-01-02 NOTE — Progress Notes (Signed)
   Cody Grant  RUE:[id number] DOB: 1960/07/25 DOA: 01/01/2023 PCP: Corwin Levins, MD    Brief Narrative:  62 year old with a history of COPD, HTN, factor V Leiden heterozygosity, chronic lumbar radiculopathy with left foot drop and ongoing tobacco abuse who presented to the ER 9/2 with 3 days of progressive shortness of breath worse with exertion and not responding to home nebulizers.  In the ER he was found to be COVID positive.  CTa of the chest was negative for PE.  The patient was found to be wheezing on exam.  Goals of Care:   Code Status: Full Code   DVT prophylaxis: enoxaparin (LOVENOX) injection 40 mg Start: 01/01/23 2200   Interim Hx: Afebrile since admission.  Vital signs stable.  Oxygen saturation 96% on room air.  The patient states that he is feeling slightly better but continues to have significant wheezing.  He denies chest pain nausea or vomiting.  Assessment & Plan:  Acute bronchospastic COPD exacerbation Continue systemic steroids and nebulizer therapies - appears to be making slow progress  COVID infection No significant infiltrate on CXR -likely the inciting agent for his COPD exacerbation -no specific pharmacologic therapy indicated presently   HTN Continue usual amlodipine  Ongoing tobacco abuse Encouraged patient to continue with his efforts at smoking cessation  Chronic pain Continue usual oxycodone dose  4.2 mm solid pulmonary nodule right upper lobe and 2.5 mm left upper lobe pulmonary nodule Noted on lung cancer screening CT scan July 2024 - follow-up at 6 months recommended  Family Communication: Spoke with spouse at bedside Disposition: Anticipate discharge home when medically improved   Objective: Blood pressure (!) 147/85, pulse 98, temperature 97.6 F (36.4 C), temperature source Oral, resp. rate 15, height 5\' 8"  (1.727 m), weight 68 kg, SpO2 96%. No intake or output data in the 24 hours ending 01/02/23 0939 Filed Weights   01/01/23 1424   Weight: 68 kg    Examination: General: No acute respiratory distress Lungs: Extensive expiratory wheezing bilaterally without prolongation of expiratory phase with no focal crackles Cardiovascular: Regular rate and rhythm without murmur gallop or rub normal S1 and S2 Abdomen: Nontender, nondistended, soft, bowel sounds positive, no rebound, no ascites, no appreciable mass Extremities: No significant cyanosis, clubbing, or edema bilateral lower extremities  CBC: Recent Labs  Lab 01/01/23 1514 01/02/23 0820  WBC 11.0* 11.9*  HGB 12.7* 12.8*  HCT 39.1 38.0*  MCV 91.8 90.5  PLT 289 271   Basic Metabolic Panel: Recent Labs  Lab 01/01/23 1514 01/02/23 0820  NA 135 138  K 4.1 4.0  CL 100 101  CO2 26 26  GLUCOSE 113* 136*  BUN 6* 8  CREATININE 0.96 0.78  CALCIUM 8.9 9.5   GFR: Estimated Creatinine Clearance: 92.1 mL/min (by C-G formula based on SCr of 0.78 mg/dL).   Scheduled Meds:  amLODipine  10 mg Oral QHS   arformoterol  15 mcg Nebulization BID   budesonide (PULMICORT) nebulizer solution  0.25 mg Nebulization BID   enoxaparin (LOVENOX) injection  40 mg Subcutaneous Q24H   guaiFENesin  600 mg Oral BID   methylPREDNISolone (SOLU-MEDROL) injection  40 mg Intravenous Q12H   sodium chloride flush  3 mL Intravenous Q12H      LOS: 1 day   Lonia Blood, MD [facility] Office  [phone] Pager - Text Page per Loretha Stapler  If 7PM-7AM, please contact night-coverage per Amion 01/02/2023, 9:39 AM

## 2023-01-02 NOTE — Progress Notes (Signed)
RN secure chat RT about PRN breathing tx, RT explained to RN I was tied up in the ICU and stated RN would have to give. RN said they couldn't do it. RT then stated she would attempt to give it when available at this time.

## 2023-01-02 NOTE — Plan of Care (Signed)
Problem: Education: Goal: Knowledge of disease or condition will improve 01/02/2023 0535 by Arlyss Repress, RN Outcome: Progressing 01/02/2023 0443 by Arlyss Repress, RN Outcome: Progressing Goal: Knowledge of the prescribed therapeutic regimen will improve 01/02/2023 0535 by Arlyss Repress, RN Outcome: Progressing 01/02/2023 0443 by Arlyss Repress, RN Outcome: Progressing Goal: Individualized Educational Video(s) 01/02/2023 0535 by Arlyss Repress, RN Outcome: Progressing 01/02/2023 0443 by Arlyss Repress, RN Outcome: Progressing   Problem: Activity: Goal: Ability to tolerate increased activity will improve 01/02/2023 0535 by Arlyss Repress, RN Outcome: Progressing 01/02/2023 0443 by Arlyss Repress, RN Outcome: Progressing Goal: Will verbalize the importance of balancing activity with adequate rest periods 01/02/2023 0535 by Arlyss Repress, RN Outcome: Progressing 01/02/2023 0443 by Arlyss Repress, RN Outcome: Progressing   Problem: Respiratory: Goal: Ability to maintain a clear airway will improve 01/02/2023 0535 by Arlyss Repress, RN Outcome: Progressing 01/02/2023 0443 by Arlyss Repress, RN Outcome: Progressing Goal: Levels of oxygenation will improve 01/02/2023 0535 by Arlyss Repress, RN Outcome: Progressing 01/02/2023 0443 by Arlyss Repress, RN Outcome: Progressing Goal: Ability to maintain adequate ventilation will improve 01/02/2023 0535 by Arlyss Repress, RN Outcome: Progressing 01/02/2023 0443 by Arlyss Repress, RN Outcome: Progressing   Problem: Education: Goal: Knowledge of General Education information will improve Description: Including pain rating scale, medication(s)/side effects and non-pharmacologic comfort measures 01/02/2023 0535 by Arlyss Repress, RN Outcome: Progressing 01/02/2023 0443 by Arlyss Repress, RN Outcome: Progressing   Problem: Health Behavior/Discharge Planning: Goal: Ability to manage health-related needs will improve 01/02/2023 0535 by Arlyss Repress, RN Outcome: Progressing 01/02/2023 0443 by Arlyss Repress, RN Outcome: Progressing   Problem: Clinical Measurements: Goal: Ability to maintain clinical measurements within normal limits will improve 01/02/2023 0535 by Arlyss Repress, RN Outcome: Progressing 01/02/2023 0443 by Arlyss Repress, RN Outcome: Progressing Goal: Will remain free from infection 01/02/2023 0535 by Arlyss Repress, RN Outcome: Progressing 01/02/2023 0443 by Arlyss Repress, RN Outcome: Progressing Goal: Diagnostic test results will improve 01/02/2023 0535 by Arlyss Repress, RN Outcome: Progressing 01/02/2023 0443 by Arlyss Repress, RN Outcome: Progressing Goal: Respiratory complications will improve 01/02/2023 0535 by Arlyss Repress, RN Outcome: Progressing 01/02/2023 0443 by Arlyss Repress, RN Outcome: Progressing Goal: Cardiovascular complication will be avoided 01/02/2023 0535 by Arlyss Repress, RN Outcome: Progressing 01/02/2023 0443 by Arlyss Repress, RN Outcome: Progressing   Problem: Activity: Goal: Risk for activity intolerance will decrease 01/02/2023 0535 by Arlyss Repress, RN Outcome: Progressing 01/02/2023 0443 by Arlyss Repress, RN Outcome: Progressing   Problem: Nutrition: Goal: Adequate nutrition will be maintained 01/02/2023 0535 by Arlyss Repress, RN Outcome: Progressing 01/02/2023 0443 by Arlyss Repress, RN Outcome: Progressing   Problem: Coping: Goal: Level of anxiety will decrease 01/02/2023 0535 by Arlyss Repress, RN Outcome: Progressing 01/02/2023 0443 by Arlyss Repress, RN Outcome: Progressing   Problem: Elimination: Goal: Will not experience complications related to bowel motility 01/02/2023 0535 by Arlyss Repress, RN Outcome: Progressing 01/02/2023 0443 by Arlyss Repress, RN Outcome: Progressing Goal: Will not experience complications related to urinary retention 01/02/2023 0535 by Arlyss Repress, RN Outcome: Progressing 01/02/2023 0443 by Arlyss Repress, RN Outcome:  Progressing   Problem: Pain Managment: Goal: General experience of comfort will improve 01/02/2023 0535 by Arlyss Repress, RN Outcome: Progressing 01/02/2023 0443 by Arlyss Repress, RN Outcome: Progressing   Problem: Safety: Goal:  Ability to remain free from injury will improve 01/02/2023 0535 by Arlyss Repress, RN Outcome: Progressing 01/02/2023 0443 by Arlyss Repress, RN Outcome: Progressing   Problem: Skin Integrity: Goal: Risk for impaired skin integrity will decrease 01/02/2023 0535 by Arlyss Repress, RN Outcome: Progressing 01/02/2023 0443 by Arlyss Repress, RN Outcome: Progressing

## 2023-01-02 NOTE — Progress Notes (Signed)
Pt has audible expiatory wheezing. RT contacted and breathing treatment for Pt requested.

## 2023-01-03 DIAGNOSIS — J441 Chronic obstructive pulmonary disease with (acute) exacerbation: Secondary | ICD-10-CM | POA: Diagnosis not present

## 2023-01-03 LAB — BASIC METABOLIC PANEL
Anion gap: 10 (ref 5–15)
BUN: 19 mg/dL (ref 8–23)
CO2: 25 mmol/L (ref 22–32)
Calcium: 9.1 mg/dL (ref 8.9–10.3)
Chloride: 102 mmol/L (ref 98–111)
Creatinine, Ser: 0.98 mg/dL (ref 0.61–1.24)
GFR, Estimated: >60 mL/min (ref >60)
Glucose, Bld: 143 mg/dL — ABNORMAL HIGH (ref 70–99)
Potassium: 4 mmol/L (ref 3.5–5.1)
Sodium: 137 mmol/L (ref 135–145)

## 2023-01-03 LAB — CBC
HCT: 39.8 % (ref 39.0–52.0)
Hemoglobin: 13.3 g/dL (ref 13.0–17.0)
MCH: 30.7 pg (ref 26.0–34.0)
MCHC: 33.4 g/dL (ref 30.0–36.0)
MCV: 91.9 fL (ref 80.0–100.0)
Platelets: 293 10*3/uL (ref 150–400)
RBC: 4.33 MIL/uL (ref 4.22–5.81)
RDW: 12.8 % (ref 11.5–15.5)
WBC: 22.4 10*3/uL — ABNORMAL HIGH (ref 4.0–10.5)
nRBC: 0 % (ref 0.0–0.2)

## 2023-01-03 LAB — MAGNESIUM: Magnesium: 2.1 mg/dL (ref 1.7–2.4)

## 2023-01-03 LAB — FOLATE: Folate: 7 ng/mL (ref >5.9)

## 2023-01-03 LAB — VITAMIN B12: Vitamin B-12: 334 pg/mL (ref 180–914)

## 2023-01-03 MED ORDER — METHYLPREDNISOLONE SODIUM SUCC 40 MG IJ SOLR: 40.0000 mg | Freq: Two times a day (BID) | INTRAMUSCULAR | Status: DC

## 2023-01-03 MED ORDER — METHYLPREDNISOLONE SODIUM SUCC 125 MG IJ SOLR
80.0000 mg | Freq: Every day | INTRAMUSCULAR | Status: DC
  Administered 2023-01-03 – 2023-01-05 (×3): 80 mg via INTRAVENOUS
  Filled 2023-01-03 (×3): qty 2

## 2023-01-03 MED ORDER — IPRATROPIUM-ALBUTEROL 0.5-2.5 (3) MG/3ML IN SOLN
3.0000 mL | Freq: Four times a day (QID) | RESPIRATORY_TRACT | Status: DC | PRN
  Administered 2023-01-03: 3 mL via RESPIRATORY_TRACT

## 2023-01-03 MED ORDER — GUAIFENESIN ER 600 MG PO TB12
1200.0000 mg | ORAL_TABLET | Freq: Two times a day (BID) | ORAL | Status: DC
  Administered 2023-01-03 – 2023-01-05 (×5): 1200 mg via ORAL
  Filled 2023-01-03 (×5): qty 2

## 2023-01-03 NOTE — Progress Notes (Signed)
PROGRESS NOTE    Cody Grant  XLK:[id number] DOB: 09/18/1960 DOA: 01/01/2023 PCP: Corwin Levins, MD   Brief Narrative: 62 year old with past medical history significant for COPD,  hypertension, Factor V Leiden, chronic lumbar radiculopathy with left foot drop, ongoing tobacco abuse presents to the ER 9/2 with 3 days of progressive shortness of breath, worse on exertion not improving with home nebulizers.  In the ED he was found to be COVID-positive.  CTA chest negative for PE.    Assessment & Plan:   Principal Problem:   COPD with acute exacerbation (HCC) Active Problems:   Essential hypertension   Tobacco use   1-Acute COPD exacerbation In the setting of Covid Viral illness.  Still wheezing. Continue with IV solumedrol.  Continue with schedule Duoneb, Pulmicort, Brovana.  Increase guaifenesin.  Flutter valve.    2-COVID 19 infection Continue with IV steroids.  Support care.   3-Hypertension: -Continue with Norvasc.   Ongoing tobacco abuse: -Counseling.   Chronic pain: On PRN Zanaflex.   4.2 mm solid pulmonary nodule right upper lobe and 2.5 mm left upper lobe pulmonary nodule: Noted on lung cancer screening CT scan July 2024.  Needs follow-up in 6 months.     Estimated body mass index is 22.81 kg/m as calculated from the following:   Height as of this encounter: 5\' 8"  (1.727 m).   Weight as of this encounter: 68 kg.   DVT prophylaxis: Lovenox Code Status: Full code Family Communication: care discussed with patient.  Disposition Plan:  Status is: Inpatient Remains inpatient appropriate because: management of Covid , COPD exacerbation.     Consultants:  none  Procedures:  None  Antimicrobials:    Subjective: He report morning are usually harder. He report cough, dry.   Objective: Vitals:   01/02/23 2150 01/03/23 0022 01/03/23 0541 01/03/23 0718  BP: (!) 158/84 (!) 144/86 134/81 (!) 147/80  Pulse: 99 82 93 85  Resp: 18  18 17   Temp: 98 F  (36.7 C) 97.8 F (36.6 C) 97.9 F (36.6 C) (!) 97.5 F (36.4 C)  TempSrc: Oral Oral Oral Oral  SpO2: 90% 97% 94% 98%  Weight:      Height:       No intake or output data in the 24 hours ending 01/03/23 0740 Filed Weights   01/01/23 1424  Weight: 68 kg    Examination:  General exam: Appears calm and comfortable  Respiratory system: BL expiratory wheezing.  Cardiovascular system: S1 & S2 heard, RRR.Marland Kitchen Gastrointestinal system: Abdomen is nondistended, soft and nontender.  Central nervous system: Alert and oriented. No focal neurological deficits. Extremities: Symmetric 5 x 5 power. Skin: No rashes, lesions or ulcers Psychiatry: Judgement and insight appear normal. Mood & affect appropriate.     Data Reviewed: I have personally reviewed following labs and imaging studies  CBC: Recent Labs  Lab 01/01/23 1514 01/02/23 0820  WBC 11.0* 11.9*  HGB 12.7* 12.8*  HCT 39.1 38.0*  MCV 91.8 90.5  PLT 289 271   Basic Metabolic Panel: Recent Labs  Lab 01/01/23 1514 01/02/23 0820  NA 135 138  K 4.1 4.0  CL 100 101  CO2 26 26  GLUCOSE 113* 136*  BUN 6* 8  CREATININE 0.96 0.78  CALCIUM 8.9 9.5   GFR: Estimated Creatinine Clearance: 92.1 mL/min (by C-G formula based on SCr of 0.78 mg/dL). Liver Function Tests: No results for input(s): "AST", "ALT", "ALKPHOS", "BILITOT", "PROT", "ALBUMIN" in the last 168 hours. No results for input(s): "LIPASE", "  AMYLASE" in the last 168 hours. No results for input(s): "AMMONIA" in the last 168 hours. Coagulation Profile: No results for input(s): "INR", "PROTIME" in the last 168 hours. Cardiac Enzymes: No results for input(s): "CKTOTAL", "CKMB", "CKMBINDEX", "TROPONINI" in the last 168 hours. BNP (last 3 results) No results for input(s): "PROBNP" in the last 8760 hours. HbA1C: No results for input(s): "HGBA1C" in the last 72 hours. CBG: No results for input(s): "GLUCAP" in the last 168 hours. Lipid Profile: No results for input(s):  "CHOL", "HDL", "LDLCALC", "TRIG", "CHOLHDL", "LDLDIRECT" in the last 72 hours. Thyroid Function Tests: No results for input(s): "TSH", "T4TOTAL", "FREET4", "T3FREE", "THYROIDAB" in the last 72 hours. Anemia Panel: No results for input(s): "VITAMINB12", "FOLATE", "FERRITIN", "TIBC", "IRON", "RETICCTPCT" in the last 72 hours. Sepsis Labs: No results for input(s): "PROCALCITON", "LATICACIDVEN" in the last 168 hours.  Recent Results (from the past 240 hour(s))  Resp panel by RT-PCR (RSV, Flu A&B, Covid) Anterior Nasal Swab     Status: Abnormal   Collection Time: 01/01/23  4:20 PM   Specimen: Anterior Nasal Swab  Result Value Ref Range Status   SARS Coronavirus 2 by RT PCR POSITIVE (A) NEGATIVE Final   Influenza A by PCR NEGATIVE NEGATIVE Final   Influenza B by PCR NEGATIVE NEGATIVE Final    Comment: (NOTE) The Xpert Xpress SARS-CoV-2/FLU/RSV plus assay is intended as an aid in the diagnosis of influenza from Nasopharyngeal swab specimens and should not be used as a sole basis for treatment. Nasal washings and aspirates are unacceptable for Xpert Xpress SARS-CoV-2/FLU/RSV testing.  Fact Sheet for Patients: BloggerCourse.com  Fact Sheet for Healthcare Providers: SeriousBroker.it  This test is not yet approved or cleared by the [place] FDA and has been authorized for detection and/or diagnosis of SARS-CoV-2 by FDA under an Emergency Use Authorization (EUA). This EUA will remain in effect (meaning this test can be used) for the duration of the COVID-19 declaration under Section 564(b)(1) of the Act, 21 U.S.C. section 360bbb-3(b)(1), unless the authorization is terminated or revoked.     Resp Syncytial Virus by PCR NEGATIVE NEGATIVE Final    Comment: (NOTE) Fact Sheet for Patients: BloggerCourse.com  Fact Sheet for Healthcare Providers: SeriousBroker.it  This test is not yet  approved or cleared by the [place] FDA and has been authorized for detection and/or diagnosis of SARS-CoV-2 by FDA under an Emergency Use Authorization (EUA). This EUA will remain in effect (meaning this test can be used) for the duration of the COVID-19 declaration under Section 564(b)(1) of the Act, 21 U.S.C. section 360bbb-3(b)(1), unless the authorization is terminated or revoked.  Performed at [facility], [address], [place], [place] [address]          Radiology Studies: CT Angio Chest PE W and/or Wo Contrast  Result Date: 01/01/2023 CLINICAL DATA:  Shortness of breath, concern for pulmonary embolism. EXAM: CT ANGIOGRAPHY CHEST WITH CONTRAST TECHNIQUE: Multidetector CT imaging of the chest was performed using the standard protocol during bolus administration of intravenous contrast. Multiplanar CT image reconstructions and MIPs were obtained to evaluate the vascular anatomy. RADIATION DOSE REDUCTION: This exam was performed according to the departmental dose-optimization program which includes automated exposure control, adjustment of the mA and/or kV according to patient size and/or use of iterative reconstruction technique. CONTRAST:  75mL OMNIPAQUE IOHEXOL 350 MG/ML SOLN COMPARISON:  Same day chest radiograph and chest CT dated 10/30/2022. FINDINGS: Cardiovascular: Satisfactory opacification of the pulmonary arteries to the segmental level. No evidence of  pulmonary embolism. Vascular calcifications are seen in the coronary arteries and aortic arch. Normal heart size. No pericardial effusion. Mediastinum/Nodes: An enlarged right hilar lymph node measures 10 mm in short axis. No enlarged mediastinal, axillary, or left hilar lymph nodes. Thyroid gland, trachea, and esophagus demonstrate no significant findings. Lungs/Pleura: Centrilobular emphysema is redemonstrated. A 4 mm pulmonary nodule in the right upper lobe appears unchanged since 10/30/2022. A subpleural nodular  opacity in the lateral left upper lobe appears unchanged since 10/26/2021. No pleural effusion or pneumothorax. Upper Abdomen: No acute abnormality. Musculoskeletal: No chest wall abnormality. No acute or significant osseous findings. Review of the MIP images confirms the above findings. IMPRESSION: 1. No evidence of pulmonary embolism. 2. Enlarged right hilar lymph node is nonspecific and may be reactive. 3. Per prior report dated 10/30/2022, short-term follow-up in 6 months is recommended with repeat low-dose chest CT without contrast (please use the following order, CT CHEST LCS NODULE FOLLOW-UP W/O CM). Aortic Atherosclerosis (ICD10-I70.0) and Emphysema (ICD10-J43.9). Electronically Signed   By: Romona Curls M.D.   On: 01/01/2023 17:56   DG Chest 2 View  Result Date: 01/01/2023 CLINICAL DATA:  COPD EXAM: CHEST - 2 VIEW COMPARISON:  04/25/2021 FINDINGS: Stable coarse somewhat attenuated bronchovascular markings and borderline hyperinflation. No focal infiltrate. Heart size and mediastinal contours are within normal limits. No effusion. Visualized bones unremarkable. IMPRESSION: Borderline hyperinflation. No acute findings. Electronically Signed   By: Corlis Leak M.D.   On: 01/01/2023 16:15        Scheduled Meds:  amLODipine  10 mg Oral QHS   budesonide (PULMICORT) nebulizer solution  0.25 mg Nebulization BID   enoxaparin (LOVENOX) injection  40 mg Subcutaneous Q24H   guaiFENesin  600 mg Oral BID   ipratropium-albuterol  3 mL Nebulization Q6H   predniSONE  40 mg Oral Q breakfast   sodium chloride flush  3 mL Intravenous Q12H   Continuous Infusions:   LOS: 2 days    Time spent: 35 minutes    Laraine Samet A Olyn Landstrom, MD [facility]   If 7PM-7AM, please contact night-coverage [email or web address]  01/03/2023, 7:40 AM

## 2023-01-03 NOTE — Plan of Care (Signed)

## 2023-01-04 DIAGNOSIS — J441 Chronic obstructive pulmonary disease with (acute) exacerbation: Secondary | ICD-10-CM | POA: Diagnosis not present

## 2023-01-04 MED ORDER — AZITHROMYCIN 500 MG PO TABS
500.0000 mg | ORAL_TABLET | Freq: Every day | ORAL | Status: DC
  Administered 2023-01-04 – 2023-01-05 (×2): 500 mg via ORAL
  Filled 2023-01-04 (×2): qty 1

## 2023-01-04 MED ORDER — IPRATROPIUM-ALBUTEROL 0.5-2.5 (3) MG/3ML IN SOLN
3.0000 mL | Freq: Two times a day (BID) | RESPIRATORY_TRACT | Status: DC
  Administered 2023-01-04 – 2023-01-05 (×2): 3 mL via RESPIRATORY_TRACT
  Filled 2023-01-04 (×2): qty 3

## 2023-01-04 NOTE — Progress Notes (Addendum)
6-minute walk test  Pulse oximetry on room air is 94  Pulse oximetry while ambulating 95% on room air.

## 2023-01-04 NOTE — Plan of Care (Signed)

## 2023-01-04 NOTE — Progress Notes (Signed)
PROGRESS NOTE    Cody Grant  YQI:[id number] DOB: [birth year] DOA: 01/01/2023 PCP: Corwin Levins, MD   Brief Narrative: 62 year old with past medical history significant for COPD,  hypertension, Factor V Leiden, chronic lumbar radiculopathy with left foot drop, ongoing tobacco abuse presents to the ER 9/2 with 3 days of progressive shortness of breath, worse on exertion not improving with home nebulizers.  In the ED he was found to be COVID-positive.  CTA chest negative for PE.    Assessment & Plan:   Principal Problem:   COPD with acute exacerbation (HCC) Active Problems:   Essential hypertension   Tobacco use   1-Acute COPD exacerbation In the setting of Covid Viral illness.  Still wheezing. Continue with IV solumedrol.  Continue with schedule Duoneb, Pulmicort, Brovana.  Continue with guaifenesin.  Flutter valve.  Add Azithromycin.   2-COVID 19 infection Continue with IV steroids.  Support care.   3-Hypertension: -Continue with Norvasc.   Ongoing tobacco abuse: -Counseling.   Chronic pain: On PRN Zanaflex.   4.2 mm solid pulmonary nodule right upper lobe and 2.5 mm left upper lobe pulmonary nodule: Noted on lung cancer screening CT scan July 2024.  Needs follow-up in 6 months.  Leukocytosis; in setting steroids.    Estimated body mass index is 22.81 kg/m as calculated from the following:   Height as of this encounter: 5\' 8"  (1.727 m).   Weight as of this encounter: 68 kg.   DVT prophylaxis: Lovenox Code Status: Full code Family Communication: care discussed with patient. Wife at bedside.  Disposition Plan:  Status is: Inpatient Remains inpatient appropriate because: management of Covid , COPD exacerbation.     Consultants:  none  Procedures:  None  Antimicrobials:    Subjective: He is alert, report still having SOB on exertion, he was feeling better yesterday. He think he over did it.   Objective: Vitals:   01/04/23 0109 01/04/23 0439  01/04/23 0720 01/04/23 1200  BP:  (!) 145/77 (!) 153/74 134/76  Pulse:  89 73 (!) 109  Resp:  18 18 19   Temp:  98.2 F (36.8 C) 98.1 F (36.7 C) 98.6 F (37 C)  TempSrc:  Oral Oral Oral  SpO2: 93% 93% 94% 94%  Weight:      Height:       No intake or output data in the 24 hours ending 01/04/23 1428 Filed Weights   01/01/23 1424  Weight: 68 kg    Examination:  General exam: NAD Respiratory system: BL expiratory wheezing.  Cardiovascular system: S 1, S 2 RRR Gastrointestinal system: BS present, soft, nt Central nervous system: alert Extremities: no edema    Data Reviewed: I have personally reviewed following labs and imaging studies  CBC: Recent Labs  Lab 01/01/23 1514 01/02/23 0820 01/03/23 1234  WBC 11.0* 11.9* 22.4*  HGB 12.7* 12.8* 13.3  HCT 39.1 38.0* 39.8  MCV 91.8 90.5 91.9  PLT 289 271 293   Basic Metabolic Panel: Recent Labs  Lab 01/01/23 1514 01/02/23 0820 01/03/23 1234  NA 135 138 137  K 4.1 4.0 4.0  CL 100 101 102  CO2 26 26 25   GLUCOSE 113* 136* 143*  BUN 6* 8 19  CREATININE 0.96 0.78 0.98  CALCIUM 8.9 9.5 9.1  MG  --   --  2.1   GFR: Estimated Creatinine Clearance: 75.2 mL/min (by C-G formula based on SCr of 0.98 mg/dL). Liver Function Tests: No results for input(s): "AST", "ALT", "ALKPHOS", "BILITOT", "PROT", "ALBUMIN" in  the last 168 hours. No results for input(s): "LIPASE", "AMYLASE" in the last 168 hours. No results for input(s): "AMMONIA" in the last 168 hours. Coagulation Profile: No results for input(s): "INR", "PROTIME" in the last 168 hours. Cardiac Enzymes: No results for input(s): "CKTOTAL", "CKMB", "CKMBINDEX", "TROPONINI" in the last 168 hours. BNP (last 3 results) No results for input(s): "PROBNP" in the last 8760 hours. HbA1C: No results for input(s): "HGBA1C" in the last 72 hours. CBG: No results for input(s): "GLUCAP" in the last 168 hours. Lipid Profile: No results for input(s): "CHOL", "HDL", "LDLCALC", "TRIG",  "CHOLHDL", "LDLDIRECT" in the last 72 hours. Thyroid Function Tests: No results for input(s): "TSH", "T4TOTAL", "FREET4", "T3FREE", "THYROIDAB" in the last 72 hours. Anemia Panel: Recent Labs    01/03/23 1234  VITAMINB12 334  FOLATE 7.0   Sepsis Labs: No results for input(s): "PROCALCITON", "LATICACIDVEN" in the last 168 hours.  Recent Results (from the past 240 hour(s))  Resp panel by RT-PCR (RSV, Flu A&B, Covid) Anterior Nasal Swab     Status: Abnormal   Collection Time: 01/01/23  4:20 PM   Specimen: Anterior Nasal Swab  Result Value Ref Range Status   SARS Coronavirus 2 by RT PCR POSITIVE (A) NEGATIVE Final   Influenza A by PCR NEGATIVE NEGATIVE Final   Influenza B by PCR NEGATIVE NEGATIVE Final    Comment: (NOTE) The Xpert Xpress SARS-CoV-2/FLU/RSV plus assay is intended as an aid in the diagnosis of influenza from Nasopharyngeal swab specimens and should not be used as a sole basis for treatment. Nasal washings and aspirates are unacceptable for Xpert Xpress SARS-CoV-2/FLU/RSV testing.  Fact Sheet for Patients: BloggerCourse.com  Fact Sheet for Healthcare Providers: SeriousBroker.it  This test is not yet approved or cleared by the [place] FDA and has been authorized for detection and/or diagnosis of SARS-CoV-2 by FDA under an Emergency Use Authorization (EUA). This EUA will remain in effect (meaning this test can be used) for the duration of the COVID-19 declaration under Section 564(b)(1) of the Act, 21 U.S.C. section 360bbb-3(b)(1), unless the authorization is terminated or revoked.     Resp Syncytial Virus by PCR NEGATIVE NEGATIVE Final    Comment: (NOTE) Fact Sheet for Patients: BloggerCourse.com  Fact Sheet for Healthcare Providers: SeriousBroker.it  This test is not yet approved or cleared by the [place] FDA and has been authorized for detection  and/or diagnosis of SARS-CoV-2 by FDA under an Emergency Use Authorization (EUA). This EUA will remain in effect (meaning this test can be used) for the duration of the COVID-19 declaration under Section 564(b)(1) of the Act, 21 U.S.C. section 360bbb-3(b)(1), unless the authorization is terminated or revoked.  Performed at [facility], [address] [place], [place] [address]          Radiology Studies: No results found.      Scheduled Meds:  amLODipine  10 mg Oral QHS   azithromycin  500 mg Oral Daily   budesonide (PULMICORT) nebulizer solution  0.25 mg Nebulization BID   enoxaparin (LOVENOX) injection  40 mg Subcutaneous Q24H   guaiFENesin  1,200 mg Oral BID   ipratropium-albuterol  3 mL Nebulization Q6H   methylPREDNISolone (SOLU-MEDROL) injection  80 mg Intravenous Daily   sodium chloride flush  3 mL Intravenous Q12H   Continuous Infusions:   LOS: 3 days    Time spent: 35 minutes    Pierina Schuknecht A Yassir Enis, MD [facility]   If 7PM-7AM, please contact night-coverage [email or web address]  01/04/2023, 2:28 PM

## 2023-01-04 NOTE — Plan of Care (Signed)
Patient alert/oriented X4. Patient compliant with medication administration and nicotine patch applied to R arm. Patient performed 6-minute walk test and stated at 93% on room air while ambulating. Patient VSS and has no complaints at this time. Patient up in chair for the majority of the shift.   Problem: Education: Goal: Knowledge of disease or condition will improve Outcome: Progressing   Problem: Education: Goal: Knowledge of the prescribed therapeutic regimen will improve Outcome: Progressing   Problem: Education: Goal: Individualized Educational Video(s) Outcome: Progressing   Problem: Activity: Goal: Ability to tolerate increased activity will improve Outcome: Progressing   Problem: Activity: Goal: Will verbalize the importance of balancing activity with adequate rest periods Outcome: Progressing   Problem: Respiratory: Goal: Ability to maintain a clear airway will improve Outcome: Progressing   Problem: Respiratory: Goal: Levels of oxygenation will improve Outcome: Progressing   Problem: Respiratory: Goal: Ability to maintain adequate ventilation will improve Outcome: Progressing   Problem: Education: Goal: Knowledge of General Education information will improve Description: Including pain rating scale, medication(s)/side effects and non-pharmacologic comfort measures Outcome: Progressing   Problem: Health Behavior/Discharge Planning: Goal: Ability to manage health-related needs will improve Outcome: Progressing   Problem: Clinical Measurements: Goal: Ability to maintain clinical measurements within normal limits will improve Outcome: Progressing   Problem: Clinical Measurements: Goal: Will remain free from infection Outcome: Progressing   Problem: Clinical Measurements: Goal: Diagnostic test results will improve Outcome: Progressing   Problem: Clinical Measurements: Goal: Respiratory complications will improve Outcome: Progressing   Problem:  Clinical Measurements: Goal: Cardiovascular complication will be avoided Outcome: Progressing   Problem: Activity: Goal: Risk for activity intolerance will decrease Outcome: Progressing   Problem: Nutrition: Goal: Adequate nutrition will be maintained Outcome: Progressing   Problem: Coping: Goal: Level of anxiety will decrease Outcome: Progressing   Problem: Elimination: Goal: Will not experience complications related to bowel motility Outcome: Progressing   Problem: Elimination: Goal: Will not experience complications related to urinary retention Outcome: Progressing   Problem: Pain Managment: Goal: General experience of comfort will improve Outcome: Progressing   Problem: Safety: Goal: Ability to remain free from injury will improve Outcome: Progressing   Problem: Skin Integrity: Goal: Risk for impaired skin integrity will decrease Outcome: Progressing

## 2023-01-05 DIAGNOSIS — J441 Chronic obstructive pulmonary disease with (acute) exacerbation: Secondary | ICD-10-CM | POA: Diagnosis not present

## 2023-01-05 MED ORDER — ALBUTEROL SULFATE (2.5 MG/3ML) 0.083% IN NEBU: 2.5000 mg | INHALATION_SOLUTION | Freq: Four times a day (QID) | RESPIRATORY_TRACT | 12 refills | Status: DC | PRN

## 2023-01-05 MED ORDER — ALBUTEROL SULFATE HFA 108 (90 BASE) MCG/ACT IN AERS: 2.0000 | INHALATION_SPRAY | Freq: Four times a day (QID) | RESPIRATORY_TRACT | 3 refills | Status: AC | PRN

## 2023-01-05 MED ORDER — AZITHROMYCIN 500 MG PO TABS: 500.0000 mg | ORAL_TABLET | Freq: Every day | ORAL | 0 refills | Status: DC

## 2023-01-05 MED ORDER — IPRATROPIUM-ALBUTEROL 0.5-2.5 (3) MG/3ML IN SOLN: 3.0000 mL | Freq: Four times a day (QID) | RESPIRATORY_TRACT | 0 refills | Status: DC | PRN

## 2023-01-05 MED ORDER — GUAIFENESIN ER 600 MG PO TB12: 1200.0000 mg | ORAL_TABLET | Freq: Two times a day (BID) | ORAL | 0 refills | Status: AC

## 2023-01-05 MED ORDER — PREDNISONE 10 MG PO TABS: ORAL_TABLET | ORAL | 0 refills | Status: DC

## 2023-01-05 NOTE — Discharge Summary (Addendum)
Physician Discharge Summary   Patient: Cody Grant MRN: [id number] DOB: [birth year]  Admit date:     01/01/2023  Discharge date: 01/05/23  Discharge Physician: Alba Cory   PCP: Corwin Levins, MD   Recommendations at discharge:    Follow up with PCP for resolution of COPD exacerbation.  Needs CT chest follow up for lung nodules.   Discharge Diagnoses: Principal Problem:   COPD with acute exacerbation (HCC) Active Problems:   Essential hypertension   Tobacco use  Resolved Problems:   * No resolved hospital problems. [facility] Course: 62 year old with past medical history significant for COPD,  hypertension, Factor V Leiden, chronic lumbar radiculopathy with left foot drop, ongoing tobacco abuse presents to the ER 9/2 with 3 days of progressive shortness of breath, worse on exertion not improving with home nebulizers.  In the ED he was found to be COVID-positive.  CTA chest negative for PE.    Assessment and Plan: 1-Acute COPD exacerbation In the setting of Covid Viral illness.  Treated with IV solumedrol. Discharge on prednisone taper.  Continue with schedule Duoneb, Pulmicort, Brovana.  Continue with guaifenesin.  Flutter valve.  Plan to complete 5 days of  Azithromycin.    2-COVID 19 infection Received  with IV steroids for COPD exacerbation.  Support care.    3-Hypertension: -Continue with Norvasc.    Ongoing tobacco abuse: -Counseling.    Chronic pain: On PRN Zanaflex.    4.2 mm solid pulmonary nodule right upper lobe and 2.5 mm left upper lobe pulmonary nodule: Noted on lung cancer screening CT scan July 2024.  Needs follow-up in 6 months.   Leukocytosis; in setting steroids.           Consultants: None Procedures performed: None  Disposition: Home Diet recommendation:  Discharge Diet Orders (From admission, onward)     Start     Ordered   01/05/23 0000  Diet - low sodium heart healthy        01/05/23 0923           Cardiac  diet DISCHARGE MEDICATION: Allergies as of 01/05/2023       Reactions   Statins Other (See Comments)   Leg weakness        Medication List     TAKE these medications    acetaminophen 325 MG tablet Commonly known as: TYLENOL Take 650 mg by mouth every 6 (six) hours as needed for fever.   albuterol (2.5 MG/3ML) 0.083% nebulizer solution Commonly known as: PROVENTIL Take 3 mLs (2.5 mg total) by nebulization every 6 (six) hours as needed for wheezing or shortness of breath.   albuterol 108 (90 Base) MCG/ACT inhaler Commonly known as: VENTOLIN HFA Inhale 2 puffs into the lungs every 6 (six) hours as needed for wheezing or shortness of breath.   amLODipine 10 MG tablet Commonly known as: NORVASC TAKE 1 TABLET(10 MG) BY MOUTH DAILY What changed:  how much to take how to take this when to take this additional instructions   azithromycin 500 MG tablet Commonly known as: ZITHROMAX Take 1 tablet (500 mg total) by mouth daily.   guaiFENesin 600 MG 12 hr tablet Commonly known as: MUCINEX Take 2 tablets (1,200 mg total) by mouth 2 (two) times daily.   ipratropium-albuterol 0.5-2.5 (3) MG/3ML Soln Commonly known as: DUONEB Take 3 mLs by nebulization every 6 (six) hours as needed (wheezing or increased shortness of breath).   Narcan 4 MG/0.1ML Liqd nasal spray kit Generic drug: naloxone  Place 0.4 mg into the nose as needed (accidental overdose).   Oxycodone HCl 10 MG Tabs Take 10 mg by mouth See admin instructions. Take 10 mg by mouth in the morning and midday- may take an additional 10 mg with supper as needed for pain   predniSONE 10 MG tablet Commonly known as: DELTASONE Take 40 mg daily for 3 days then 30 mg daily for 2 days then 20 mg daily for 1 days, then take 1 table for one day.   Stiolto Respimat 2.5-2.5 MCG/ACT Aers Generic drug: Tiotropium Bromide-Olodaterol Inhale 2 puffs into the lungs daily.   tiZANidine 4 MG tablet Commonly known as: ZANAFLEX TAKE 1  TABLET(4 MG) BY MOUTH EVERY 8 HOURS AS NEEDED FOR MUSCLE SPASMS What changed: See the new instructions.   Vitamin D (Ergocalciferol) 1.25 MG (50000 UNIT) Caps capsule Commonly known as: DRISDOL Take 50,000 Units by mouth every 7 (seven) days.   zolpidem 10 MG tablet Commonly known as: AMBIEN TAKE 1 TABLET(10 MG) BY MOUTH AT BEDTIME AS NEEDED FOR SLEEP What changed:  how much to take how to take this when to take this additional instructions        Follow-up Information     Corwin Levins, MD Follow up in 1 week(s).   Specialties: Internal Medicine, Radiology Contact information: [address] [place] [address] [phone]                Discharge Exam: Ceasar Mons Weights   01/01/23 1424  Weight: 68 kg   General; NAD  Condition at discharge: stable  The results of significant diagnostics from this hospitalization (including imaging, microbiology, ancillary and laboratory) are listed below for reference.   Imaging Studies: CT Angio Chest PE W and/or Wo Contrast  Result Date: 01/01/2023 CLINICAL DATA:  Shortness of breath, concern for pulmonary embolism. EXAM: CT ANGIOGRAPHY CHEST WITH CONTRAST TECHNIQUE: Multidetector CT imaging of the chest was performed using the standard protocol during bolus administration of intravenous contrast. Multiplanar CT image reconstructions and MIPs were obtained to evaluate the vascular anatomy. RADIATION DOSE REDUCTION: This exam was performed according to the departmental dose-optimization program which includes automated exposure control, adjustment of the mA and/or kV according to patient size and/or use of iterative reconstruction technique. CONTRAST:  75mL OMNIPAQUE IOHEXOL 350 MG/ML SOLN COMPARISON:  Same day chest radiograph and chest CT dated 10/30/2022. FINDINGS: Cardiovascular: Satisfactory opacification of the pulmonary arteries to the segmental level. No evidence of pulmonary embolism. Vascular calcifications are seen in  the coronary arteries and aortic arch. Normal heart size. No pericardial effusion. Mediastinum/Nodes: An enlarged right hilar lymph node measures 10 mm in short axis. No enlarged mediastinal, axillary, or left hilar lymph nodes. Thyroid gland, trachea, and esophagus demonstrate no significant findings. Lungs/Pleura: Centrilobular emphysema is redemonstrated. A 4 mm pulmonary nodule in the right upper lobe appears unchanged since 10/30/2022. A subpleural nodular opacity in the lateral left upper lobe appears unchanged since 10/26/2021. No pleural effusion or pneumothorax. Upper Abdomen: No acute abnormality. Musculoskeletal: No chest wall abnormality. No acute or significant osseous findings. Review of the MIP images confirms the above findings. IMPRESSION: 1. No evidence of pulmonary embolism. 2. Enlarged right hilar lymph node is nonspecific and may be reactive. 3. Per prior report dated 10/30/2022, short-term follow-up in 6 months is recommended with repeat low-dose chest CT without contrast (please use the following order, CT CHEST LCS NODULE FOLLOW-UP W/O CM). Aortic Atherosclerosis (ICD10-I70.0) and Emphysema (ICD10-J43.9). Electronically Signed   By: Joselyn Glassman  Litton M.D.   On: 01/01/2023 17:56   DG Chest 2 View  Result Date: 01/01/2023 CLINICAL DATA:  COPD EXAM: CHEST - 2 VIEW COMPARISON:  04/25/2021 FINDINGS: Stable coarse somewhat attenuated bronchovascular markings and borderline hyperinflation. No focal infiltrate. Heart size and mediastinal contours are within normal limits. No effusion. Visualized bones unremarkable. IMPRESSION: Borderline hyperinflation. No acute findings. Electronically Signed   By: Corlis Leak M.D.   On: 01/01/2023 16:15    Microbiology: Results for orders placed or performed during the hospital encounter of 01/01/23  Resp panel by RT-PCR (RSV, Flu A&B, Covid) Anterior Nasal Swab     Status: Abnormal   Collection Time: 01/01/23  4:20 PM   Specimen: Anterior Nasal Swab  Result  Value Ref Range Status   SARS Coronavirus 2 by RT PCR POSITIVE (A) NEGATIVE Final   Influenza A by PCR NEGATIVE NEGATIVE Final   Influenza B by PCR NEGATIVE NEGATIVE Final    Comment: (NOTE) The Xpert Xpress SARS-CoV-2/FLU/RSV plus assay is intended as an aid in the diagnosis of influenza from Nasopharyngeal swab specimens and should not be used as a sole basis for treatment. Nasal washings and aspirates are unacceptable for Xpert Xpress SARS-CoV-2/FLU/RSV testing.  Fact Sheet for Patients: BloggerCourse.com  Fact Sheet for Healthcare Providers: SeriousBroker.it  This test is not yet approved or cleared by the [place] FDA and has been authorized for detection and/or diagnosis of SARS-CoV-2 by FDA under an Emergency Use Authorization (EUA). This EUA will remain in effect (meaning this test can be used) for the duration of the COVID-19 declaration under Section 564(b)(1) of the Act, 21 U.S.C. section 360bbb-3(b)(1), unless the authorization is terminated or revoked.     Resp Syncytial Virus by PCR NEGATIVE NEGATIVE Final    Comment: (NOTE) Fact Sheet for Patients: BloggerCourse.com  Fact Sheet for Healthcare Providers: SeriousBroker.it  This test is not yet approved or cleared by the [place] FDA and has been authorized for detection and/or diagnosis of SARS-CoV-2 by FDA under an Emergency Use Authorization (EUA). This EUA will remain in effect (meaning this test can be used) for the duration of the COVID-19 declaration under Section 564(b)(1) of the Act, 21 U.S.C. section 360bbb-3(b)(1), unless the authorization is terminated or revoked.  Performed at [facility], [address], [place], [place] [address]     Labs: CBC: Recent Labs  Lab 01/01/23 1514 01/02/23 0820 01/03/23 1234  WBC 11.0* 11.9* 22.4*  HGB 12.7* 12.8* 13.3  HCT 39.1 38.0* 39.8   MCV 91.8 90.5 91.9  PLT 289 271 293   Basic Metabolic Panel: Recent Labs  Lab 01/01/23 1514 01/02/23 0820 01/03/23 1234  NA 135 138 137  K 4.1 4.0 4.0  CL 100 101 102  CO2 26 26 25   GLUCOSE 113* 136* 143*  BUN 6* 8 19  CREATININE 0.96 0.78 0.98  CALCIUM 8.9 9.5 9.1  MG  --   --  2.1   Liver Function Tests: No results for input(s): "AST", "ALT", "ALKPHOS", "BILITOT", "PROT", "ALBUMIN" in the last 168 hours. CBG: No results for input(s): "GLUCAP" in the last 168 hours.  Discharge time spent: greater than 30 minutes.  Signed: Alba Cory, MD [facility] 01/05/2023

## 2023-01-05 NOTE — Plan of Care (Signed)
Patient alert/oriented X4. Patient compliant with medication administration and complains of minimal pain. VSS and discharge AVS instructions explained in detail. Patient has personal belongings packed at bedside and PIV removed prior to discharge.   Problem: Education: Goal: Knowledge of disease or condition will improve Outcome: Adequate for Discharge   Problem: Education: Goal: Knowledge of the prescribed therapeutic regimen will improve Outcome: Adequate for Discharge   Problem: Education: Goal: Individualized Educational Video(s) Outcome: Adequate for Discharge   Problem: Activity: Goal: Ability to tolerate increased activity will improve Outcome: Adequate for Discharge   Problem: Activity: Goal: Will verbalize the importance of balancing activity with adequate rest periods Outcome: Adequate for Discharge   Problem: Respiratory: Goal: Ability to maintain a clear airway will improve Outcome: Adequate for Discharge   Problem: Respiratory: Goal: Levels of oxygenation will improve Outcome: Adequate for Discharge   Problem: Respiratory: Goal: Ability to maintain adequate ventilation will improve Outcome: Adequate for Discharge   Problem: Education: Goal: Knowledge of General Education information will improve Description: Including pain rating scale, medication(s)/side effects and non-pharmacologic comfort measures Outcome: Adequate for Discharge   Problem: Health Behavior/Discharge Planning: Goal: Ability to manage health-related needs will improve Outcome: Adequate for Discharge   Problem: Clinical Measurements: Goal: Ability to maintain clinical measurements within normal limits will improve Outcome: Adequate for Discharge   Problem: Clinical Measurements: Goal: Will remain free from infection Outcome: Adequate for Discharge   Problem: Clinical Measurements: Goal: Diagnostic test results will improve Outcome: Adequate for Discharge   Problem: Clinical  Measurements: Goal: Respiratory complications will improve Outcome: Adequate for Discharge   Problem: Clinical Measurements: Goal: Cardiovascular complication will be avoided Outcome: Adequate for Discharge   Problem: Activity: Goal: Risk for activity intolerance will decrease Outcome: Adequate for Discharge   Problem: Nutrition: Goal: Adequate nutrition will be maintained Outcome: Adequate for Discharge   Problem: Coping: Goal: Level of anxiety will decrease Outcome: Adequate for Discharge   Problem: Elimination: Goal: Will not experience complications related to bowel motility Outcome: Adequate for Discharge   Problem: Elimination: Goal: Will not experience complications related to urinary retention Outcome: Adequate for Discharge   Problem: Pain Managment: Goal: General experience of comfort will improve Outcome: Adequate for Discharge   Problem: Safety: Goal: Ability to remain free from injury will improve Outcome: Adequate for Discharge   Problem: Skin Integrity: Goal: Risk for impaired skin integrity will decrease Outcome: Adequate for Discharge

## 2023-01-05 NOTE — Plan of Care (Signed)

## 2023-01-05 NOTE — Discharge Instructions (Signed)
Please quarantine for 10 days from covid test results.

## 2023-01-08 ENCOUNTER — Telehealth: Payer: Self-pay

## 2023-01-08 NOTE — Transitions of Care (Post Inpatient/ED Visit) (Signed)
01/08/2023  Name: Cody Grant MRN: [id number] DOB: 1961/02/10  Today's TOC FU Call Status: Today's TOC FU Call Status:: Successful TOC FU Call Completed TOC FU Call Complete Date: 01/08/23 Patient's Name and Date of Birth confirmed.  Transition Care Management Follow-up Telephone Call Date of Discharge: 01/05/23 Discharge Facility: [facility]) Type of Discharge: Inpatient Admission Primary Inpatient Discharge Diagnosis:: COPD with acute exacerbation How have you been since you were released from the hospital?: Better Any questions or concerns?: No  Items Reviewed: Did you receive and understand the discharge instructions provided?: Yes Medications obtained,verified, and reconciled?: Yes (Medications Reviewed) Any new allergies since your discharge?: No Dietary orders reviewed?: NA Do you have support at home?: Yes People in Home: significant other  Medications Reviewed Today: Medications Reviewed Today     Reviewed by Leigh Aurora, CMA (Certified Medical Assistant) on 01/08/23 at 1050  Med List Status: <None>   Medication Order Taking? Sig Documenting Provider Last Dose Status Informant  acetaminophen (TYLENOL) 325 MG tablet [id number] No Take 650 mg by mouth every 6 (six) hours as needed for fever. [provider] Not Taking Active Self  albuterol (PROVENTIL) (2.5 MG/3ML) 0.083% nebulizer solution [id number]  Take 3 mLs (2.5 mg total) by nebulization every 6 (six) hours as needed for wheezing or shortness of breath. Regalado, Belkys A, MD  Active   albuterol (VENTOLIN HFA) 108 (90 Base) MCG/ACT inhaler [id number]  Inhale 2 puffs into the lungs every 6 (six) hours as needed for wheezing or shortness of breath. Regalado, Belkys A, MD  Active   amLODipine (NORVASC) 10 MG tablet [id number] No TAKE 1 TABLET(10 MG) BY MOUTH DAILY  Patient taking differently: Take 10 mg by mouth at bedtime.   Corwin Levins, MD 12/31/2022 pm Active Self  azithromycin (ZITHROMAX) 500 MG  tablet [id number]  Take 1 tablet (500 mg total) by mouth daily. Regalado, Belkys A, MD  Active   guaiFENesin (MUCINEX) 600 MG 12 hr tablet [id number]  Take 2 tablets (1,200 mg total) by mouth 2 (two) times daily. Regalado, Belkys A, MD  Active   ipratropium-albuterol (DUONEB) 0.5-2.5 (3) MG/3ML SOLN [id number]  Take 3 mLs by nebulization every 6 (six) hours as needed (wheezing or increased shortness of breath). Regalado, Belkys A, MD  Active   NARCAN 4 MG/0.1ML LIQD nasal spray kit [id number] No Place 0.4 mg into the nose as needed (accidental overdose). [provider] Taking Active Self           Med Note [name]   Tue Sep 19, 2022  1:17 PM) 09/19/22- reports has never needed but has on hand if needed  Oxycodone HCl 10 MG TABS [id number] No Take 10 mg by mouth See admin instructions. Take 10 mg by mouth in the morning and midday- may take an additional 10 mg with supper as needed for pain [provider] 01/01/2023 am Active Self  predniSONE (DELTASONE) 10 MG tablet [id number]  Take 40 mg daily for 3 days then 30 mg daily for 2 days then 20 mg daily for 1 days, then take 1 table for one day. Regalado, Belkys A, MD  Active   Tiotropium Bromide-Olodaterol (STIOLTO RESPIMAT) 2.5-2.5 MCG/ACT AERS [id number] No Inhale 2 puffs into the lungs daily. Nyoka Cowden, MD 01/01/2023 am Active Self  tiZANidine (ZANAFLEX) 4 MG tablet [id number] No TAKE 1 TABLET(4 MG) BY MOUTH EVERY 8 HOURS AS NEEDED FOR MUSCLE SPASMS  Patient taking differently: Take 4 mg by mouth every 8 (  eight) hours as needed for muscle spasms (or pain).   Judi Saa, DO unk Active Self  Vitamin D, Ergocalciferol, (DRISDOL) 1.25 MG (50000 UNIT) CAPS capsule [id number] No Take 50,000 Units by mouth every 7 (seven) days. [provider] Past Week Active Self           Med Note [name], [name] Jan 01, 2023  7:40 PM) Takes this on no certain day of the week  zolpidem (AMBIEN) 10 MG tablet [id number] No TAKE 1  TABLET(10 MG) BY MOUTH AT BEDTIME AS NEEDED FOR SLEEP  Patient taking differently: Take 10 mg by mouth at bedtime.   Corwin Levins, MD 12/31/2022 pm Active Self           Med Note [name], [name] Jan 01, 2023  7:36 PM)              Home Care and Equipment/Supplies: Were Home Health Services Ordered?: NA Any new equipment or medical supplies ordered?: NA  Functional Questionnaire: Do you need assistance with bathing/showering or dressing?: No Do you need assistance with meal preparation?: No Do you need assistance with eating?: No Do you have difficulty maintaining continence: No Do you need assistance with getting out of bed/getting out of a chair/moving?: No Do you have difficulty managing or taking your medications?: No  Follow up appointments reviewed: PCP Follow-up appointment confirmed?: Yes Date of PCP follow-up appointment?: 01/17/23 Follow-up Provider: Dr. Laray Anger Follow-up appointment confirmed?: NA Do you need transportation to your follow-up appointment?: No Do you understand care options if your condition(s) worsen?: Yes-patient verbalized understanding    SIGNATURE Agnes Lawrence, CMA (AAMA)  [facility] [phone]

## 2023-01-17 ENCOUNTER — Ambulatory Visit: Payer: Medicare HMO | Admitting: Internal Medicine

## 2023-01-17 ENCOUNTER — Encounter: Payer: Self-pay | Admitting: Internal Medicine

## 2023-01-17 VITALS — BP 110/70 | HR 105 | Temp 98.8°F | Ht 68.0 in | Wt 148.0 lb

## 2023-01-17 DIAGNOSIS — J441 Chronic obstructive pulmonary disease with (acute) exacerbation: Secondary | ICD-10-CM

## 2023-01-17 DIAGNOSIS — Z720 Tobacco use: Secondary | ICD-10-CM | POA: Diagnosis not present

## 2023-01-17 NOTE — Progress Notes (Signed)
Patient ID: Cody Grant, male   DOB: 1960-08-25, 62 y.o.   MRN: [id number]        Chief Complaint: follow up recent hospn sept 2 - 6 2024       HPI:  Cody Grant is a 62 y.o. male here with c/o above with copd exacerbation, covid infection, and incidental finding pulm nodules on CT; pt tx with IV steroid and d/c with pred taper and zpack.  Pt to continue all current inhaler.  BP stable. Quit smoking x 2 wks after 47 yrs and determined to stay quit.  Pt denies chest pain, increased sob or doe, wheezing, orthopnea, PND, increased LE swelling, palpitations, dizziness or syncope.   Pt denies polydipsia, polyuria, or new focal neuro s/s.           Transitional Care Management elements noted today: 1)  Date of D/C: as above 2)  Medication reconciliation:  done today at end visit 3)  Review of D/C summary or other information:  done today 4)  Review of need for f/u on pending diagnostic tests and treatments:  done today - will need f/u CT chest in 6 months 5)  Review of need for Interaction with other providers who will assume or resume care of pt specific problems: done today - pulmonary as schedued 6)  Education of patient/family/guardian or caregiver: none needed  Wt Readings from Last 3 Encounters:  01/17/23 148 lb (67.1 kg)  01/01/23 150 lb (68 kg)  09/20/22 159 lb (72.1 kg)   BP Readings from Last 3 Encounters:  01/17/23 110/70  01/05/23 137/89  09/20/22 (!) 146/94         Past Medical History:  Diagnosis Date   Allergic rhinitis, cause unspecified    Asthma    as child   Chronic lumbar radiculopathy    left with left drop foot/numbness   Chronic pain    to see Dr Penni Homans   COPD, 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 He has a 7.5 pack-year smoking history. He has never used smokeless tobacco. He reports current alcohol use of about 2.0 - 4.0 standard drinks of alcohol per week. He reports that he does not use drugs. family history includes COPD in his mother; Diabetes in his mother; Pulmonary embolism in his father. Allergies  Allergen Reactions   Statins Other (See Comments)    Leg weakness   Current Outpatient Medications on File Prior to Visit  Medication Sig Dispense Refill   acetaminophen (TYLENOL) 325 MG tablet Take 650 mg by mouth every 6 (six) hours as needed for fever.     albuterol (PROVENTIL) (2.5 MG/3ML) 0.083% nebulizer solution Take 3 mLs (2.5 mg total) by nebulization every 6 (six) hours as needed for wheezing or shortness of breath. 75 mL 12   albuterol (VENTOLIN HFA) 108 (90 Base) MCG/ACT inhaler Inhale 2 puffs into the lungs every 6 (six) hours as needed for wheezing or shortness of breath. 54 g 3   amLODipine (NORVASC) 10 MG tablet TAKE 1 TABLET(10 MG) BY MOUTH DAILY (Patient taking differently: Take 10 mg by mouth at bedtime.) 90 tablet 3   azithromycin (ZITHROMAX) 500 MG  tablet Take 1 tablet (500 mg total) by mouth daily. 3 tablet 0   guaiFENesin (MUCINEX) 600 MG 12 hr tablet Take 2 tablets (1,200 mg total) by mouth 2 (two) times daily. 30 tablet 0   ipratropium-albuterol (DUONEB) 0.5-2.5 (3) MG/3ML SOLN Take 3 mLs by nebulization every 6 (six) hours as needed (wheezing or increased shortness of breath). 360 mL 0   NARCAN 4 MG/0.1ML LIQD nasal spray kit Place 0.4 mg into the nose as needed (accidental overdose).     Oxycodone HCl 10 MG TABS Take 10 mg by mouth See admin instructions. Take 10 mg by mouth in the morning and midday- may take an additional 10 mg with supper as needed for pain     predniSONE (DELTASONE) 10 MG tablet Take 40 mg daily for 3 days then 30 mg daily for 2 days then 20 mg daily for 1 days, then take 1 table for one day. 20  tablet 0   Tiotropium Bromide-Olodaterol (STIOLTO RESPIMAT) 2.5-2.5 MCG/ACT AERS Inhale 2 puffs into the lungs daily. 12 g 3   tiZANidine (ZANAFLEX) 4 MG tablet TAKE 1 TABLET(4 MG) BY MOUTH EVERY 8 HOURS AS NEEDED FOR MUSCLE SPASMS (Patient taking differently: Take 4 mg by mouth every 8 (eight) hours as needed for muscle spasms (or pain).) 90 tablet 0   Vitamin D, Ergocalciferol, (DRISDOL) 1.25 MG (50000 UNIT) CAPS capsule Take 50,000 Units by mouth every 7 (seven) days.     zolpidem (AMBIEN) 10 MG tablet TAKE 1 TABLET(10 MG) BY MOUTH AT BEDTIME AS NEEDED FOR SLEEP (Patient taking differently: Take 10 mg by mouth at bedtime.) 90 tablet 1   No current facility-administered medications on file prior to visit.        ROS:  All others reviewed and negative.  Objective        PE:  BP 110/70 (BP Location: Right Arm, Patient Position: Sitting, Cuff Size: Normal)   Pulse (!) 105   Temp 98.8 F (37.1 C) (Oral)   Ht 5\' 8"  (1.727 m)   Wt 148 lb (67.1 kg)   SpO2 94%   BMI 22.50 kg/m                 Constitutional: Pt appears in NAD               HENT: Head: NCAT.                Right Ear: External ear normal.                 Left Ear: External ear normal.                Eyes: . Pupils are equal, round, and reactive to light. Conjunctivae and EOM are normal               Nose: without d/c or deformity               Neck: Neck supple. Gross normal ROM               Cardiovascular: Normal rate and regular rhythm.                 Pulmonary/Chest: Effort normal and breath sounds without rales or wheezing.                Abd:  Soft, NT, ND, + BS, no organomegaly               Neurological: Pt is  alert. At baseline orientation, motor grossly intact               Skin: Skin is warm. No rashes, no other new lesions, LE edema - none               Psychiatric: Pt behavior is normal without agitation   Micro: none  Cardiac tracings I have personally interpreted today:  none  Pertinent Radiological  findings (summarize): none   Lab Results  Component Value Date   WBC 22.4 (H) 01/03/2023   HGB 13.3 01/03/2023   HCT 39.8 01/03/2023   PLT 293 01/03/2023   GLUCOSE 143 (H) 01/03/2023   CHOL 213 (H) 02/24/2019   TRIG 205.0 (H) 02/24/2019   HDL 50.60 02/24/2019   LDLDIRECT 137.0 02/24/2019   LDLCALC 105 (H) 12/19/2017   ALT 24 04/25/2021   AST 24 04/25/2021   NA 137 01/03/2023   K 4.0 01/03/2023   CL 102 01/03/2023   CREATININE 0.98 01/03/2023   BUN 19 01/03/2023   CO2 25 01/03/2023   TSH 1.41 02/24/2019   PSA 1.08 02/24/2019   INR 1.0 04/25/2021   HGBA1C 5.5 04/26/2021   Assessment/Plan:  Cody Grant is a 62 y.o. Unavailable [8] male with  has a past medical history of Allergic rhinitis, cause unspecified, Asthma, Chronic lumbar radiculopathy, Chronic pain, COPD, severe (HCC) (06/22/2013), Factor 5 Leiden mutation, heterozygous (HCC), HTN (hypertension) (08/25/2019), Left foot drop, Lumbar disc disease, and Other and unspecified hyperlipidemia (05/20/2013).  COPD with acute exacerbation (HCC) Resolved, stable, cont current inhaler regimen  Tobacco use Now quit x 2 wks, pt encouraged to stay quit  Followup: Return in about 6 months (around 07/17/2023).  Oliver Barre, MD 01/17/2023 7:50 PM [facility]

## 2023-01-17 NOTE — Assessment & Plan Note (Signed)
Resolved, stable, cont current inhaler regimen

## 2023-01-17 NOTE — Assessment & Plan Note (Signed)
Now quit x 2 wks, pt encouraged to stay quit

## 2023-01-17 NOTE — Patient Instructions (Signed)
Please continue all other medications as before, and refills have been done if requested.  Please have the pharmacy call with any other refills you may need.  Please continue your efforts at being more active, low cholesterol diet, and weight control.  You are otherwise up to date with prevention measures today.  Please keep your appointments with your specialists as you may have planned - pulmonary with the follow up CT scan (LDCT)  No further lab work needed today  Please make an Appointment to return in 6 months, or sooner if needed

## 2023-01-24 ENCOUNTER — Ambulatory Visit: Payer: Medicare HMO | Admitting: Internal Medicine

## 2023-01-24 ENCOUNTER — Encounter: Payer: Self-pay | Admitting: Internal Medicine

## 2023-01-24 VITALS — BP 118/72 | HR 88 | Ht 68.0 in | Wt 147.0 lb

## 2023-01-24 DIAGNOSIS — J449 Chronic obstructive pulmonary disease, unspecified: Secondary | ICD-10-CM | POA: Diagnosis not present

## 2023-01-24 MED ORDER — PREDNISONE 10 MG PO TABS: ORAL_TABLET | ORAL | 0 refills | Status: DC

## 2023-01-24 NOTE — Patient Instructions (Addendum)
Plan A = Automatic = Always=    stioto 2 puff each am   Plan B = Backup (to supplement plan A, not to replace it) Only use your albuterol inhaler as a rescue medication to be used if you can't catch your breath by resting or doing a relaxed purse lip breathing pattern.  - The less you use it, the better it will work when you need it. - Ok to use the inhaler up to 2 puffs  every 4 hours if you must but call for appointment if use goes up over your usual need - Don't leave home without it !!  (think of it like the spare tire for your car)   Plan C = Crisis (instead of Plan B but only if Plan B stops working) - only use your albuterol nebulizer if you first try Plan B and it fails to help > ok to use the nebulizer up to every 4 hours but if start needing it regularly call for immediate appointment  Also  Ok to try albuterol 15 min before an activity (on alternating days)  that you know would usually make you short of breath and see if it makes any difference and if makes none then don't take albuterol after activity unless you can't catch your breath as this means it's the resting that helps, not the albuterol.       Plan D = Delatasone = prednisone    when ABC not working great - take 10 mg x 2 daily until better and not needing so much B and C then ok to take 1 daily x 5 days and stop      Please schedule a follow up visit in 3months but call sooner if needed

## 2023-01-24 NOTE — Assessment & Plan Note (Addendum)
Quit smoking 01/01/23  PFT's   05/20/2013   FEV1 1.95 (54 % ) ratio 59  p 39 % improvement from saba p ? prior to study with DLCO  62 % corrects to 85  % for alv volume   - 01/25/2017    try stiolto 2 each am  - PFT's  03/09/2017  FEV1 1.65 (48 % ) ratio 54  p 1 % improvement from saba p stiolto  prior to study with DLCO  56/57 % corrects to 74 % for alv volume   - 03/11/2018 added prn prednisone x 6 days for refractory symptoms as PLAN D - 03/11/2018  After extensive coaching inhaler device,  effectiveness =    90% with smi and hfa  - 04/10/2018   Walked RA  2 laps @ 276ft each @ avg pace  stopped due to end of study no desats -  Alpha one AT screen 02/26/2020 >>>  MS   Level 148  - 01/24/2023  After extensive coaching inhaler device,  effectiveness =    75%  from baseline of 50%   Advised: Restart ABCD plan as prior to admit with more approp saba see avs for instructions unique to this ov  - see avs for instructions unique to this ov  - Re SABA :  I spent extra time with pt today reviewing appropriate use of albuterol for prn use on exertion with the following points: 1) saba is for relief of sob that does not improve by walking a slower pace or resting but rather if the pt does not improve after trying this first. 2) If the pt is convinced, as many are, that saba helps recover from activity faster then it's easy to tell if this is the case by re-challenging : ie stop, take the inhaler, then p 5 minutes try the exact same activity (intensity of workload) that just caused the symptoms and see if they are substantially diminished or not after saba 3) if there is an activity that reproducibly causes the symptoms, try the saba 15 min before the activity on alternate days   If in fact the saba really does help, then fine to continue to use it prn but advised may need to look closer at the maintenance regimen being used to achieve better control of airways disease with exertion.          Each  maintenance medication was reviewed in detail including emphasizing most importantly the difference between maintenance and prns and under what circumstances the prns are to be triggered using an action plan format where appropriate.  Total time for H and P, chart review, counseling, reviewing hfa/neb device(s) and generating customized AVS unique to this office visit / same day charting  > 30 min

## 2023-01-24 NOTE — Progress Notes (Signed)
Subjective:    Patient ID: Cody Grant, male   DOB: 1960-11-13     MRN: [id number]    Brief patient profile:  62  yowm from [facility]  quit smoking 01/01/23 with GOLD 3 copd / MS phenotype with large reversible component 2015 referred to pulmonary clinic 01/25/2017 by Dr   Jonny Ruiz    History of Present Illness  01/25/2017 1st [facility] office visit/ [name]   Chief Complaint  Patient presents with   Pulmonary Consult    Referred by Dr. Oliver Barre. Pt dxed with COPD in 2016. He states he has trouble with his breathing with hot/humid weather. He tends to have trouble in the am's, and has a non prod cough. He states he went a period of time without his spiriva due to cost and breathing got worse, but has improved since he started this back recently. He uses proair and also albuterol neb both 1 x per wk on average.   limited more by L radicular back  pain than   sob maint on spiriva dpi which he assoc with upper airway pattern coughing  Recurrent acute cough flares 2 x year then needs neb more then but usually not needing  Much saba unless he overdoes it out in the heat   rec Plan A = Automatic = Stiolto 2 pffs each am  Work on inhaler technique:  relax and gently blow all the way out then take a nice smooth deep breath back in, triggering the inhaler at same time you start breathing in.  Hold for up to 5 seconds if you can. Blow out thru nose. Rinse and gargle with water when done Plan B = Backup Only use your albuterol as a rescue medication Plan C = Crisis - only use your albuterol nebulizer if you first try Plan B and it fails to help > ok to use the nebulizer up to every 4 hours but if start needing it regularly call for immediate appointment     03/09/2017  f/u ov/[name] re:  Copd GOLD III // still smoking 7 cigs per day / not using stiolto consistently  Chief Complaint  Patient presents with   Follow-up    PFT's done today.  He states that he has good days and bad days. He is using his  proair 4 x per wk on average.    lot of cough/gagging worse at hs but usually sleeps/wakes ok  then after coffee starts back up  [facility] = can't walk a nl pace on a flat grade s sob but does fine slow and flat eg walking flat/slow  Only started stiolto a few days prior to OV  Since wanted to use up his spiriva supply first  rec Plan A = Automatic = Stiolto 2 pffs each am  Plan B = Backup Only use your albuterol as a rescue medication The key is to stop smoking completely before smoking completely stops you!       02/25/2021  f/u ov/[name] re: GOLD 3 copd/ MS phenotype/ smoking still  maint on stiolto 2 daily   Chief Complaint  Patient presents with   Follow-up    Breathing is overall doing well. He has some cough and PND- cough is prod in the am with clear to yellow sputum.  He has not had to use his albuterol inhaler in the past few wks.    Dyspnea:  walks dog twice daily x 1.5h / some hills  Cough: none  Sleeping: on a flat  bed / one pillow on side  SABA use: none  02: none  Covid status:  vax none / isolates  Rec Plan A = Automatic = Always=    stioto 2 puff each am  Plan B = Backup (to supplement plan A, not to replace it) Only use your albuterol inhaler as a rescue medication Plan C = Crisis (instead of Plan B but only if Plan B stops working) - only use your albuterol nebulizer if you first try Plan B  Plan D = Delatasone = prednisone   - take 6 days when ABC not working great  We will get you a new nebulizer machine    Please schedule a follow up visit in 12 months but call sooner if needed    06/23/2022  f/u ov/[name] re: GOLD 3/ MS/still smoking  maint on stiolto  with pred as plan D not needed  Chief Complaint  Patient presents with   Follow-up    Doing well.  Needs refill on Stiolto.  Dyspnea:  still walking dog  Cough: some am phlegm attributes to pnds  -clears p one hour Sleeping: usually flat/ on side / one pillow  SABA use: neb with acute flares / hfa once a  month 02: none  Covid status:   vax none/ never infected ?not sure  Lung cancer screening :  in program   Rec The key is to stop smoking completely before smoking completely stops you! Please schedule a follow up visit in 12 months but call sooner if needed    01/24/2023  f/u ov/[name] re: GOLD 3/MS   maint on stiolto /  duoneb (not prn) worse since finished last pred taper  Chief Complaint  Patient presents with   COPD    Quit smoking 01/01/23; feels breathing is worse  Dyspnea:  trouble walking dog due to humidity x 200 ft slt hilly  Cough: min dry  Sleeping: flat bed on side s  resp cc p ambien  SABA use: not using much  02: none   Lung cancer screening :  see ct a 01/01/23 and in program already    No obvious day to day or daytime variability or assoc excess/ purulent sputum or mucus plugs or hemoptysis or cp or chest tightness, subjective wheeze or overt sinus or hb symptoms.    Also denies any obvious fluctuation of symptoms with weather or environmental changes or other aggravating or alleviating factors except as outlined above   No unusual exposure hx or h/o childhood pna/ asthma or knowledge of premature birth.  Current Allergies, Complete Past Medical History, Past Surgical History, Family History, and Social History were reviewed in [employer] record.  ROS  The following are not active complaints unless bolded Hoarseness, sore throat, dysphagia, dental problems, itching, sneezing,  nasal congestion or discharge of excess mucus or purulent secretions, ear ache,   fever, chills, sweats, unintended wt loss or wt gain, classically pleuritic or exertional cp,  orthopnea pnd or arm/hand swelling  or leg swelling, presyncope, palpitations, abdominal pain, anorexia, nausea, vomiting, diarrhea  or change in bowel habits or change in bladder habits, change in stools or change in urine, dysuria, hematuria,  rash, arthralgias, visual complaints, headache, numbness,  weakness or ataxia or problems with walking or coordination,  change in mood or  memory.        Current Meds  Medication Sig   acetaminophen (TYLENOL) 325 MG tablet Take 650 mg by mouth every 6 (six) hours  as needed for fever.   albuterol (PROVENTIL) (2.5 MG/3ML) 0.083% nebulizer solution Take 3 mLs (2.5 mg total) by nebulization every 6 (six) hours as needed for wheezing or shortness of breath.   albuterol (VENTOLIN HFA) 108 (90 Base) MCG/ACT inhaler Inhale 2 puffs into the lungs every 6 (six) hours as needed for wheezing or shortness of breath.   amLODipine (NORVASC) 10 MG tablet TAKE 1 TABLET(10 MG) BY MOUTH DAILY (Patient taking differently: Take 10 mg by mouth at bedtime.)   guaiFENesin (MUCINEX) 600 MG 12 hr tablet Take 2 tablets (1,200 mg total) by mouth 2 (two) times daily.   ipratropium-albuterol (DUONEB) 0.5-2.5 (3) MG/3ML SOLN Take 3 mLs by nebulization every 6 (six) hours as needed (wheezing or increased shortness of breath).   NARCAN 4 MG/0.1ML LIQD nasal spray kit Place 0.4 mg into the nose as needed (accidental overdose).   Oxycodone HCl 10 MG TABS Take 10 mg by mouth See admin instructions. Take 10 mg by mouth in the morning and midday- may take an additional 10 mg with supper as needed for pain   Tiotropium Bromide-Olodaterol (STIOLTO RESPIMAT) 2.5-2.5 MCG/ACT AERS Inhale 2 puffs into the lungs daily.   tiZANidine (ZANAFLEX) 4 MG tablet TAKE 1 TABLET(4 MG) BY MOUTH EVERY 8 HOURS AS NEEDED FOR MUSCLE SPASMS (Patient taking differently: Take 4 mg by mouth every 8 (eight) hours as needed for muscle spasms (or pain).)   Vitamin D, Ergocalciferol, (DRISDOL) 1.25 MG (50000 UNIT) CAPS capsule Take 50,000 Units by mouth every 7 (seven) days.   zolpidem (AMBIEN) 10 MG tablet TAKE 1 TABLET(10 MG) BY MOUTH AT BEDTIME AS NEEDED FOR SLEEP (Patient taking differently: Take 10 mg by mouth at bedtime.)   [DISCONTINUED] azithromycin (ZITHROMAX) 500 MG tablet Take 1 tablet (500 mg total) by mouth  daily.   [DISCONTINUED] predniSONE (DELTASONE) 10 MG tablet Take 40 mg daily for 3 days then 30 mg daily for 2 days then 20 mg daily for 1 days, then take 1 table for one day.                    Objective:   Physical Exam  wts   01/24/2023        147 06/23/2022        157  02/25/2021      145 02/26/2020      144  04/10/2018        162  03/11/2018      161  09/06/2017          163   01/25/17 167 lb 6.4 oz (75.9 kg)  12/19/16 165 lb (74.8 kg)  08/02/16 167 lb 2 oz (75.8 kg)    Vital signs reviewed  01/24/2023  - Note at rest 02 sats  92% on RA   General appearance:    chronically ill amb wm > stated aage    HEENT : Oropharynx  clear   Nasal turbinates nl    NECK :  without  apparent JVD/ palpable Nodes/TM    LUNGS: no acc muscle use,  Mild barrel  contour chest wall with bilateral  Distant pan exp wheeze and  without cough on insp or exp maneuvers  and mild  Hyperresonant  to  percussion bilaterally     CV:  RRR  no s3 or murmur or increase in P2, and no edema   ABD:  soft and nontender    MS:  Nl gait/ ext warm without deformities Or obvious joint  restrictions  calf tenderness, cyanosis or clubbing     SKIN: warm and dry without lesions    NEURO:  alert, approp, nl sensorium with  no motor or cerebellar deficits apparent.         I personally reviewed images and agree with radiology impression as follows:   Chest CTa   01/01/23  Neg PE /Centrilobular emphysema is redemonstrated. A 4 mm pulmonary nodule in the right upper lobe appears unchanged since 10/30/2022. A subpleural nodular opacity in the lateral left upper lobe appears unchanged since 10/26/2021.   Assessment:

## 2023-05-16 LAB — EXTERNAL GENERIC LAB PROCEDURE: COLOGUARD: NEGATIVE

## 2023-05-16 LAB — COLOGUARD: COLOGUARD: NEGATIVE

## 2023-05-17 ENCOUNTER — Other Ambulatory Visit: Payer: Self-pay | Admitting: *Deleted

## 2023-05-17 DIAGNOSIS — Z87891 Personal history of nicotine dependence: Secondary | ICD-10-CM

## 2023-05-17 DIAGNOSIS — F17210 Nicotine dependence, cigarettes, uncomplicated: Secondary | ICD-10-CM

## 2023-05-17 DIAGNOSIS — R911 Solitary pulmonary nodule: Secondary | ICD-10-CM

## 2023-05-17 DIAGNOSIS — Z122 Encounter for screening for malignant neoplasm of respiratory organs: Secondary | ICD-10-CM

## 2023-07-06 ENCOUNTER — Ambulatory Visit (HOSPITAL_COMMUNITY)
Admission: RE | Admit: 2023-07-06 | Discharge: 2023-07-06 | Disposition: A | Discharge status: Home / Self Care | Payer: Medicare HMO | Source: Ambulatory Visit | Attending: Internal Medicine | Admitting: Internal Medicine

## 2023-07-06 DIAGNOSIS — Z122 Encounter for screening for malignant neoplasm of respiratory organs: Secondary | ICD-10-CM | POA: Diagnosis present

## 2023-07-06 DIAGNOSIS — Z87891 Personal history of nicotine dependence: Secondary | ICD-10-CM | POA: Diagnosis present

## 2023-07-06 DIAGNOSIS — F17210 Nicotine dependence, cigarettes, uncomplicated: Secondary | ICD-10-CM | POA: Diagnosis present

## 2023-07-06 DIAGNOSIS — R911 Solitary pulmonary nodule: Secondary | ICD-10-CM | POA: Insufficient documentation

## 2023-08-06 ENCOUNTER — Other Ambulatory Visit: Payer: Self-pay | Admitting: Acute Care

## 2023-08-06 DIAGNOSIS — Z87891 Personal history of nicotine dependence: Secondary | ICD-10-CM

## 2023-08-06 DIAGNOSIS — Z122 Encounter for screening for malignant neoplasm of respiratory organs: Secondary | ICD-10-CM

## 2023-08-06 DIAGNOSIS — F17210 Nicotine dependence, cigarettes, uncomplicated: Secondary | ICD-10-CM

## 2023-08-08 ENCOUNTER — Telehealth: Payer: Self-pay | Admitting: Internal Medicine

## 2023-08-08 NOTE — Telephone Encounter (Signed)
 Contacted Cody Grant to schedule their annual wellness visit. Patient declined to schedule AWV at this time. Transferred care.  [facility] Care Guide [facility] AWV TEAM Direct Dial: [phone]

## 2023-08-10 ENCOUNTER — Other Ambulatory Visit: Payer: Self-pay | Admitting: Internal Medicine

## 2023-08-22 NOTE — Telephone Encounter (Signed)
 LVM to confirm that patient is not longer a patient of Dr. Autry Legions.

## 2023-08-23 NOTE — Telephone Encounter (Signed)
 Amy,  I only sent messages to you, if I have spoken to the patient ans they stated that they are no longer a patient with the office or doctor. Like in this case. So, yes,I did revived a verbal conformation that he is no longer a patient. I called patient back yesterday and again today 08/22/2023 with no reply  Amie Bald Care Guide [facility] AWV TEAM Direct Dial: [phone]

## 2023-08-27 ENCOUNTER — Other Ambulatory Visit: Payer: Self-pay | Admitting: Internal Medicine

## 2023-08-27 MED ORDER — STIOLTO RESPIMAT 2.5-2.5 MCG/ACT IN AERS: 2.0000 | INHALATION_SPRAY | Freq: Every day | RESPIRATORY_TRACT | 3 refills | Status: DC

## 2023-08-27 NOTE — Telephone Encounter (Signed)
 Copied from CRM [id number]. Topic: Clinical - Medication Refill >> Aug 27, 2023  4:01 PM Crist Dominion wrote: Most Recent Primary Care Visit:  Provider: Roslyn Coombe  Department: [facility]  Visit Type: HOSPITAL FU  Date: 01/17/2023  Medication: Tiotropium Bromide -Olodaterol (STIOLTO RESPIMAT ) 2.5-2.5 MCG/ACT AERS  Has the patient contacted their pharmacy? No refills are expired.  (Agent: If no, request that the patient contact the pharmacy for the refill. If patient does not wish to contact the pharmacy document the reason why and proceed with request.) (Agent: If yes, when and what did the pharmacy advise?)  Is this the correct pharmacy for this prescription? Yes If no, delete pharmacy and type the correct one.  This is the patient's preferred pharmacy:  [facility] - [place], [place] - [address] [place] [address] Phone: [phone]  Fax: [phone]   Has the prescription been filled recently? Yes  Is the patient out of the medication? Yes  Has the patient been seen for an appointment in the last year OR does the patient have an upcoming appointment? Yes  Can we respond through MyChart? Yes  Agent: Please be advised that Rx refills may take up to 3 business days. We ask that you follow-up with your pharmacy.

## 2023-08-30 ENCOUNTER — Other Ambulatory Visit: Payer: Self-pay | Admitting: Internal Medicine

## 2023-09-03 ENCOUNTER — Other Ambulatory Visit (HOSPITAL_COMMUNITY): Payer: Self-pay

## 2023-09-03 ENCOUNTER — Telehealth: Payer: Self-pay

## 2023-09-03 MED ORDER — STIOLTO RESPIMAT 2.5-2.5 MCG/ACT IN AERS: 2.0000 | INHALATION_SPRAY | Freq: Every day | RESPIRATORY_TRACT | 3 refills | Status: DC

## 2023-09-03 NOTE — Telephone Encounter (Signed)
 Copied from CRM [id number]. Topic: Clinical - Prescription Issue >> Sep 03, 2023  1:13 PM Ilene Malling wrote: Patient [phone] is returning Nirvi Boehler's call, states STIOLTO RESPIMAT  2.5-2.5 MCG/ACT AERS needs it sent to [facility] - [place], [place] - [address] [place] [address] Phone: [phone] Fax: [phone] 3 package $131. Patient is out of medication, does the office have samples to pick up at the office?   Patient states  STIOLTO RESPIMAT  2.5-2.5 MCG/ACT AERS was sent to [facility] #[id number] Jonette Nestle, [place] - [address] AT [facility] [address] [facility] [address] [place] [address] Phone: [phone] Fax: [phone] and cannot afford the medcation, it's $309 for one package. Please call back.    Spoke with Andy Bannister. Pt would like cheaper alternative for stiolto inhaler if possible. He is switching to [facility] and is paying $130 instead of it being more expensive at [facility]. Routing to pharmacy, can you please advise of formulary alternatives.

## 2023-09-03 NOTE — Telephone Encounter (Signed)
 Unable to do an accurate benefits investigation due to the Stiolto currently being filled.

## 2023-09-03 NOTE — Telephone Encounter (Signed)
 Copied from CRM [id number]. Topic: Clinical - Prescription Issue >> Aug 30, 2023  4:56 PM Alverda Joe S wrote: Reason for CRM: patient states he requested for his medication to be sent to [facility] delivery pharmacy, it was sent to the incorrect pharmacy, please transfer prescription to [facility]- phone [phone], fax [phone]   ATC x1. LMTCB Unsure of what med needs to be changed.

## 2023-09-18 NOTE — Telephone Encounter (Signed)
 Copied from CRM [id number]. Topic: Clinical - Prescription Issue >> Sep 18, 2023  4:21 PM Cody Grant wrote: Reason for CRM: Patient [phone] states STIOLTO RESPIMAT  is too expensive over $300 every month. Patient is asking for samples of STIOLTO RESPIMAT  or can Dr. Waymond Grant prescribe another medication. Please advise and call back.   [facility] #[id number] Jonette Nestle, [place] - [address] AT [facility] [address] [place] [address] Phone:[phone]:[phone]

## 2023-09-19 ENCOUNTER — Telehealth: Payer: Self-pay

## 2023-09-19 MED ORDER — BEVESPI AEROSPHERE 9-4.8 MCG/ACT IN AERO: INHALATION_SPRAY | RESPIRATORY_TRACT | 6 refills | Status: DC

## 2023-09-19 NOTE — Telephone Encounter (Signed)
 Spoke with wife, not on [place]. Pt verbalized agreement for me to speak with wife. Advised pts wife we do not have any samples of stiolto. Pt and wife would like to try alternative inhaler. Sending new rx to preferred pharmacy.

## 2023-09-19 NOTE — Telephone Encounter (Signed)
 Dr. Waymond Hailey pt is requesting sample of stiolto. Can you please advise if OK to give samples.  Pharmacists is unable to do benefits investigation due to stiolto currently being filled.

## 2023-09-19 NOTE — Telephone Encounter (Signed)
 If we have one yes  - if not could try bevespi Take 2 puffs first thing in am and then another 2 puffs about 12 hours later.

## 2023-09-19 NOTE — Telephone Encounter (Signed)
 See previous encounter for more information. Nothing further needed

## 2023-09-19 NOTE — Telephone Encounter (Signed)
 ATC x1 no answer- left message to call back.  No samples at [address].  Bevespi inhaler pended.

## 2023-09-19 NOTE — Telephone Encounter (Signed)
 Can you check to see if Sample available as I am at [facility]?

## 2023-09-19 NOTE — Telephone Encounter (Signed)
 Copied from CRM [id number]. Topic: Clinical - Prescription Issue >> Sep 19, 2023  2:57 PM Isabell A wrote: Reason for CRM: Relayed message:   Carin [place], [facility]   09/19/23  1:35 PM Note ATC x1 no answer- left message to call back.  No samples at [address].  Bevespi inhaler pended.     Patient is still requesting to speak with Dovie Gell.  Routing to [place], [place] per pt request.

## 2023-09-27 ENCOUNTER — Telehealth: Payer: Self-pay | Admitting: Internal Medicine

## 2023-09-27 MED ORDER — STIOLTO RESPIMAT 2.5-2.5 MCG/ACT IN AERS: 2.0000 | INHALATION_SPRAY | Freq: Every day | RESPIRATORY_TRACT | 3 refills | Status: DC

## 2023-09-27 NOTE — Telephone Encounter (Addendum)
 Please see 09/03/2023 phone note.  I called and spoke to pt.  He is aware that Stiolto samples are not available. He stated that Bevespi was not affordable. He would like Stiolto sent to [facility].  According to our records, Rx was sent to [facility] on 09/03/23. Pt stated that he spoke with [facility] this morning and they have not received the the Rx.  Rx resent.  Nothing further needed.

## 2023-09-27 NOTE — Telephone Encounter (Signed)
 See last encounter. PT's wife on the phone. States their are issues w/medications still. (See Heather for [employer]. We got 8 last week from the rep.) PT states they were promised a call back and never got one. I adv we did leave a VM about a week ago. They were trying to get an alternative to [facility] and sent it to [facility]  (300.00 for one inhaler). They then said [facility] would send a new RX because Stiolto 90 day supply was 131.00 after deductible.) I looked for [facility] fax and did not see it. PT is highly upset. Please call to advise.   Again, Cody Grant should have some samples to hold him over.

## 2023-09-27 NOTE — Telephone Encounter (Signed)
 See last signed tel encounter. Let Margie know the 5/5 RX sent to [facility] was canceled by the PT. PT needs as new 90 day supply RX sent in to [facility]. This per the [facility] rep.

## 2023-09-27 NOTE — Telephone Encounter (Signed)
 Rx sent.  Please refer to duplicate phone note.

## 2023-10-05 ENCOUNTER — Other Ambulatory Visit: Payer: Self-pay | Admitting: Internal Medicine

## 2023-10-06 ENCOUNTER — Other Ambulatory Visit: Payer: Self-pay | Admitting: Internal Medicine

## 2023-11-05 NOTE — Progress Notes (Unsigned)
 Subjective:    Patient ID: Cody Grant, male   DOB: [birth year]     MRN: [id number]    Brief patient profile:  63  yowm from [facility]  quit smoking 01/01/23 with GOLD 3 copd / MS phenotype with large reversible component 2015 referred to pulmonary clinic 01/25/2017 by Dr  Norleen    History of Present Illness  01/25/2017 1st [facility] office visit/ Cody Grant   Chief Complaint  Patient presents with   Pulmonary Consult    Referred by Dr. Lynwood Norleen. Pt dxed with COPD in 2016. He states he has trouble with his breathing with hot/humid weather. He tends to have trouble in the am's, and has a non prod cough. He states he went a period of time without his spiriva  due to cost and breathing got worse, but has improved since he started this back recently. He uses proair  and also albuterol  neb both 1 x per wk on average.   limited more by L radicular back  pain than   sob maint on spiriva  dpi which he assoc with upper airway pattern coughing  Recurrent acute cough flares 2 x year then needs neb more then but usually not needing  Much saba unless he overdoes it out in the heat   rec Plan A = Automatic = Stiolto 2 pffs each am  Work on inhaler technique:  relax and gently blow all the way out then take a nice smooth deep breath back in, triggering the inhaler at same time you start breathing in.  Hold for up to 5 seconds if you can. Blow out thru nose. Rinse and gargle with water when done Plan B = Backup Only use your albuterol  as a rescue medication Plan C = Crisis - only use your albuterol  nebulizer if you first try Plan B and it fails to help > ok to use the nebulizer up to every 4 hours but if start needing it regularly call for immediate appointment     03/09/2017  f/u ov/Brianca Grant re:  Copd GOLD III // still smoking 7 cigs per day / not using stiolto consistently  Chief Complaint  Patient presents with   Follow-up    PFT's done today.  He states that he has good days and bad days. He is using  his proair  4 x per wk on average.    lot of cough/gagging worse at hs but usually sleeps/wakes ok  then after coffee starts back up  [facility] = can't walk a nl pace on a flat grade s sob but does fine slow and flat eg walking flat/slow  Only started stiolto a few days prior to OV  Since wanted to use up his spiriva  supply first  rec Plan A = Automatic = Stiolto 2 pffs each am  Plan B = Backup Only use your albuterol  as a rescue medication The key is to stop smoking completely before smoking completely stops you!       02/25/2021  f/u ov/Cody Grant re: GOLD 3 copd/ MS phenotype/ smoking still  maint on stiolto 2 daily   Chief Complaint  Patient presents with   Follow-up    Breathing is overall doing well. He has some cough and PND- cough is prod in the am with clear to yellow sputum.  He has not had to use his albuterol  inhaler in the past few wks.    Dyspnea:  walks dog twice daily x 1.5h / some hills  Cough: none  Sleeping: on a flat  bed / one pillow on side  SABA use: none  02: none  Covid status:  vax none / isolates  Rec Plan A = Automatic = Always=    stioto 2 puff each am  Plan B = Backup (to supplement plan A, not to replace it) Only use your albuterol  inhaler as a rescue medication Plan C = Crisis (instead of Plan B but only if Plan B stops working) - only use your albuterol  nebulizer if you first try Plan B  Plan D = Delatasone = prednisone    - take 6 days when ABC not working great  We will get you a new nebulizer machine    06/23/2022  f/u ov/Cody Grant re: GOLD 3/ MS/still smoking  maint on stiolto  with pred as plan D not needed  Chief Complaint  Patient presents with   Follow-up    Doing well.  Needs refill on Stiolto.  Dyspnea:  still walking dog  Cough: some am phlegm attributes to pnds  -clears p one hour Sleeping: usually flat/ on side / one pillow  SABA use: neb with acute flares / hfa once a month 02: none  Covid status:   vax none/ never infected ?not sure  Lung  cancer screening :  in program   Rec The key is to stop smoking completely before smoking completely stops you! Please schedule a follow up visit in 12 months but call sooner if needed    01/24/2023  f/u ov/Cody Grant re: GOLD 3/MS   maint on stiolto /  duoneb (not prn) worse since finished last pred taper  Chief Complaint  Patient presents with   COPD    Quit smoking 01/01/23; feels breathing is worse  Dyspnea:  trouble walking dog due to humidity x 200 ft slt [place]illy  Cough: min dry  Sleeping: flat bed on side s  resp cc p ambien   SABA use: not using much  02: none  Lung cancer screening :  see ct a 01/01/23 and in program already  Plan A = Automatic = Always=    stioto 2 puff each am  Plan B = Backup (to supplement plan A, not to replace it) Only use your albuterol  inhaler as a rescue medication Plan C = Crisis (instead of Plan B but only if Plan B stops working) - only use your albuterol  nebulizer if you first try Plan B  Also  Ok to try albuterol  15 min before an activity (on alternating days)  that you know would usually make you short of breath Plan D = Delatasone = prednisone    when ABC not working great - take 10 mg x 2 daily until better and not needing so much B and C then ok to take 1 daily x 5 days and stoP   11/07/2023  f/u ov/Cody Grant re: GOLD 3/MS    maint on STIOLTO  / LAST PRED one month pta Chief Complaint  Patient presents with   Follow-up    Pt is looking for results regarding his lung cancer screening.  Pt is concerned regarding pricing for his medication, and is interested in any other potential options that may be more affordable for him.  Dyspnea:  walking dog about the same  Cough: none but clearing throat  Sleeping: flat bed/ one pillow  resp cc  SABA use: occ hhfa/ no neb  02: none   Lung cancer screening :  07/06/23  RADS 2  with emphysema   No obvious day to day or  daytime variability or assoc excess/ purulent sputum or mucus plugs or hemoptysis or cp or chest  tightness, subjective wheeze or overt sinus or hb symptoms.    Also denies any obvious fluctuation of symptoms with weather or environmental changes or other aggravating or alleviating factors except as outlined above   No unusual exposure hx or h/o childhood pna/ asthma or knowledge of premature birth.  Current Allergies, Complete Past Medical History, Past Surgical History, Family History, and Social History were reviewed in [employer] record.  ROS  The following are not active complaints unless bolded Hoarseness, sore throat, dysphagia= globus, dental problems, itching, sneezing,  nasal congestion or discharge of excess mucus or purulent secretions, ear ache,   fever, chills, sweats, unintended wt loss or wt gain, classically pleuritic or exertional cp,  orthopnea pnd or arm/hand swelling  or leg swelling, presyncope, palpitations, abdominal pain, anorexia, nausea, vomiting, diarrhea  or change in bowel habits or change in bladder habits, change in stools or change in urine, dysuria, hematuria,  rash, arthralgias, visual complaints, headache, numbness, weakness or ataxia or problems with walking or coordination,  change in mood or  memory.        Current Meds  Medication Sig   acetaminophen  (TYLENOL ) 325 MG tablet Take 650 mg by mouth every 6 (six) hours as needed for fever.   albuterol  (PROVENTIL ) (2.5 MG/3ML) 0.083% nebulizer solution Take 3 mLs (2.5 mg total) by nebulization every 6 (six) hours as needed for wheezing or shortness of breath.   albuterol  (VENTOLIN  HFA) 108 (90 Base) MCG/ACT inhaler Inhale 2 puffs into the lungs every 6 (six) hours as needed for wheezing or shortness of breath.   amLODipine  (NORVASC ) 10 MG tablet TAKE 1 TABLET(10 MG) BY MOUTH DAILY   NARCAN 4 MG/0.1ML LIQD nasal spray kit Place 0.4 mg into the nose as needed (accidental overdose).   Oxycodone  HCl 10 MG TABS Take 10 mg by mouth See admin instructions. Take 10 mg by mouth in the morning  and midday- may take an additional 10 mg with supper as needed for pain   predniSONE  (DELTASONE ) 10 MG tablet TAKE 1 TABLET BY MOUTH TWICE DAILY UNTIL BETTER, THEN 1 TABLET DAILY FOR 5 DAYS AND STOP   Tiotropium Bromide -Olodaterol (STIOLTO RESPIMAT ) 2.5-2.5 MCG/ACT AERS Inhale 2 puffs into the lungs daily.   tiZANidine  (ZANAFLEX ) 4 MG tablet TAKE 1 TABLET(4 MG) BY MOUTH EVERY 8 HOURS AS NEEDED FOR MUSCLE SPASMS (Patient taking differently: Take 4 mg by mouth every 8 (eight) hours as needed for muscle spasms (or pain).)   Vitamin D, Ergocalciferol, (DRISDOL) 1.25 MG (50000 UNIT) CAPS capsule Take 50,000 Units by mouth every 7 (seven) days.   zolpidem  (AMBIEN ) 10 MG tablet TAKE 1 TABLET(10 MG) BY MOUTH AT BEDTIME AS NEEDED FOR SLEEP            Objective:   Physical Exam  wts   11/07/2023          178  01/24/2023        147 06/23/2022        157  02/25/2021      145 02/26/2020      144  04/10/2018        162  03/11/2018      161  09/06/2017          163   01/25/17 167 lb 6.4 oz (75.9 kg)  12/19/16 165 lb (74.8 kg)  08/02/16 167 lb 2 oz (75.8 kg)  Vital signs reviewed  11/07/2023  - Note at rest 02 sats  97% on RA   General appearance:    amb wm nad   HEENT : Oropharynx  clear   Nasal turbinates nl    NECK :  without  apparent JVD/ palpable Nodes/TM    LUNGS: no acc muscle use,  Mild barrel  contour chest wall with bilateral  Distant bs s audible wheeze and  without cough on insp or exp maneuvers  and mild  Hyperresonant  to  percussion bilaterally     CV:  RRR  no s3 or murmur or increase in P2, and no edema   ABD:  soft and nontender with pos end  insp Hoover's  in the supine position.  No bruits or organomegaly appreciated   MS:  Nl gait/ ext warm without deformities Or obvious joint restrictions  calf tenderness, cyanosis or clubbing     SKIN: warm and dry without lesions    NEURO:  alert, approp, nl sensorium with  no motor or cerebellar deficits apparent.      Assessment:

## 2023-11-07 ENCOUNTER — Ambulatory Visit: Admitting: Internal Medicine

## 2023-11-07 ENCOUNTER — Encounter: Payer: Self-pay | Admitting: Internal Medicine

## 2023-11-07 VITALS — BP 122/64 | HR 97 | Temp 98.3°F | Ht 68.0 in | Wt 178.4 lb

## 2023-11-07 DIAGNOSIS — Z87891 Personal history of nicotine dependence: Secondary | ICD-10-CM

## 2023-11-07 DIAGNOSIS — J449 Chronic obstructive pulmonary disease, unspecified: Secondary | ICD-10-CM | POA: Diagnosis not present

## 2023-11-07 MED ORDER — FAMOTIDINE 20 MG PO TABS: ORAL_TABLET | ORAL | 11 refills | Status: AC

## 2023-11-07 MED ORDER — PANTOPRAZOLE SODIUM 40 MG PO TBEC: 40.0000 mg | DELAYED_RELEASE_TABLET | Freq: Every day | ORAL | 2 refills | Status: DC

## 2023-11-07 NOTE — Assessment & Plan Note (Addendum)
 Quit smoking 01/01/23  PFT's   05/20/2013   FEV1 1.95 (54 % ) ratio 59  p 39 % improvement from saba p ? prior to study with DLCO  62 % corrects to 85  % for alv volume   - 01/25/2017    try stiolto 2 each am  - PFT's  03/09/2017  FEV1 1.65 (48 % ) ratio 54  p 1 % improvement from saba p stiolto  prior to study with DLCO  56/57 % corrects to 74 % for alv volume   - 03/11/2018 added prn prednisone  x 6 days for refractory symptoms - 03/11/2018  After extensive coaching inhaler device,  effectiveness =    90% with smi and hfa  - 04/10/2018   Walked RA  2 laps @ 26ft each @ avg pace  stopped due to end of study no desats -  [facility] AT screen 02/26/2020 >>>  MS   Level 148  - 01/24/2023  After extensive coaching inhaler device,  effectiveness =    75%  from baseline of 50%  - 11/07/2023  gerd rx for globus sensation/ dry daytime cough/ throat clearing   Suspect gerd/lpr cause of upper airway symptoms and no need to change striolto as he has already has them before taking the stiolto each am    Group D (now reclassified as E) in terms of symptom/risk and laba/lama/ICS  therefore appropriate rx at this point >>>  stiolto and prn prednisone  best option for him for now plus approp saba to be continued.      F/u q 3 m, sooner prn     Each maintenance medication was reviewed in detail including emphasizing most importantly the difference between maintenance and prns and under what circumstances the prns are to be triggered using an action plan format where appropriate.  Total time for H and P, chart review, counseling, reviewing smi/ hfa/ neb device(s) and generating customized AVS unique to this office visit / same day charting = 30 min      30 min

## 2023-11-07 NOTE — Patient Instructions (Addendum)
 Pantoprazole  (protonix ) 40 mg   Take  30-60 min before first meal of the day and Pepcid  (famotidine )  20 mg after supper until return to office - this is the best way to tell whether stomach acid is contributing to your problem.   GERD (REFLUX)  is an extremely common cause of respiratory symptoms just like yours , many times with no obvious heartburn at all.    It can be treated with medication, but also with lifestyle changes including elevation of the head of your bed (ideally with 6 -8inch blocks under the headboard of your bed),  Smoking cessation, avoidance of late meals, excessive alcohol , and avoid fatty foods, chocolate, peppermint, colas, red wine, and acidic juices such as orange juice.  NO MINT OR MENTHOL PRODUCTS SO NO COUGH DROPS  - LUDENS ok  USE SUGARLESS CANDY INSTEAD (Jolley ranchers or Stover's or Life Savers) or even ice chips will also do - the key is to swallow to prevent all throat clearing. NO OIL BASED VITAMINS - use powdered substitutes.  Avoid fish oil when coughing.    Please schedule a follow up visit in 3 months but call sooner if needed

## 2024-02-06 ENCOUNTER — Other Ambulatory Visit: Payer: Self-pay | Admitting: Internal Medicine

## 2024-02-25 ENCOUNTER — Encounter: Payer: Self-pay | Admitting: Internal Medicine

## 2024-02-25 ENCOUNTER — Ambulatory Visit: Admitting: Internal Medicine

## 2024-02-25 VITALS — BP 150/90 | HR 94 | Ht 68.0 in | Wt 177.0 lb

## 2024-02-25 DIAGNOSIS — J449 Chronic obstructive pulmonary disease, unspecified: Secondary | ICD-10-CM | POA: Diagnosis not present

## 2024-02-25 MED ORDER — ALBUTEROL SULFATE (2.5 MG/3ML) 0.083% IN NEBU: 2.5000 mg | INHALATION_SOLUTION | Freq: Four times a day (QID) | RESPIRATORY_TRACT | 12 refills | Status: AC | PRN

## 2024-02-25 MED ORDER — BUDESONIDE-FORMOTEROL FUMARATE 80-4.5 MCG/ACT IN AERO: INHALATION_SPRAY | RESPIRATORY_TRACT | 12 refills | Status: AC

## 2024-02-25 NOTE — Assessment & Plan Note (Addendum)
 Quit smoking 01/01/23  PFT's   05/20/2013   FEV1 1.95 (54 % ) ratio 59  p 39 % improvement from saba p ? prior to study with DLCO  62 % corrects to 85  % for alv volume   - 01/25/2017    try stiolto 2 each am  - PFT's  03/09/2017  FEV1 1.65 (48 % ) ratio 54  p 1 % improvement from saba p stiolto  prior to study with DLCO  56/57 % corrects to 74 % for alv volume   - 03/11/2018 added prn prednisone  x 6 days for refractory symptoms - 03/11/2018  After extensive coaching inhaler device,  effectiveness =    90% with smi and hfa  - 04/10/2018   Walked RA  2 laps @ 21ft each @ avg pace  stopped due to end of study no desats -  [facility] AT screen 02/26/2020 >>>  MS   Level 148  - 11/07/2023  gerd rx for globus sensation/ dry daytime cough/ throat clearing  - 02/25/2024  After extensive coaching inhaler device,  effectiveness =    75% hfa/ symb 80 /spriiva (3 m sample)   Pt is Group B in terms of symptom/risk and laba/lama therefore appropriate rx at this point >>>  can't afford available lama/laba but already had supply of lama so rec adding symbicort  80 generic up to 2 bid as cheapest option since can't take powders at all    Discussed in detail all the  indications, usual  risks and alternatives  relative to the benefits with patient who agrees to proceed with Rx as outlined.            Each maintenance medication was reviewed in detail including emphasizing most importantly the difference between maintenance and prns and under what circumstances the prns are to be triggered using an action plan format where appropriate.  Total time for H and P, chart review, counseling, reviewing hfa/smi/neb device(s) and generating customized AVS unique to this office visit / same day charting = 30 min

## 2024-02-25 NOTE — Patient Instructions (Signed)
 Plan A = Automatic = Always=    symbicort  80 (breyna ) 2 puff each am chase by spriva x 2 puffs  and then ok to add another 2puffs of symbicort  12 hours later if needed for breathing.  Work on inhaler technique:  relax and gently blow all the way out then take a nice smooth full deep breath back in, triggering the inhaler at same time you start breathing in.  Hold breath in for at least  5 seconds if you can. Blow out symbicort   thru nose. Rinse and gargle with water when done.  If mouth or throat bother you at all,  try brushing teeth/gums/tongue with arm and hammer toothpaste/ make a slurry and gargle and spit out.      Plan B = Backup (to supplement plan A, not to replace it) Use your albuterol  inhaler as a rescue medication to be used if you can't catch your breath by resting or slowing your pace  or doing a relaxed purse lip breathing pattern.  - The less you use it, the better it will work when you need it. - Ok to use the inhaler up to 2 puffs  every 4 hours if you must but call for appointment if use goes up over your usual need - Don't leave home without it !!  (think of it like the spare tire or starter fluid for your car)   Plan C = Crisis (instead of Plan B but only if Plan B stops working) - only use your albuterol  nebulizer if you first try Plan B and it fails to help > ok to use the nebulizer up to every 4 hours but if start needing it regularly call for immediate appointment    Please schedule a follow up visit in 6 months but call sooner if needed

## 2024-02-25 NOTE — Progress Notes (Signed)
 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 Lynwood Norleen. Pt dxed with COPD in 2016. He states he has trouble with his breathing with hot/humid weather. He tends to have trouble in the am's, and has a non prod cough. He states he went a period of time without his spiriva  due to cost and breathing got worse, but has improved since he started this back recently. He uses proair  and also albuterol  neb both 1 x per wk on average.   limited more by L radicular back  pain than   sob maint on spiriva  dpi which he assoc with upper airway pattern coughing  Recurrent acute cough flares 2 x year then needs neb more then but usually not needing  Much saba unless he overdoes it out in the heat   rec Plan A = Automatic = Stiolto 2 pffs each am  Work on inhaler technique:  relax and gently blow all the way out then take a nice smooth deep breath back in, triggering the inhaler at same time you start breathing in.  Hold for up to 5 seconds if you can. Blow out thru nose. Rinse and gargle with water when done Plan B = Backup Only use your albuterol  as a rescue medication Plan C = Crisis - only use your albuterol  nebulizer if you first try Plan B and it fails to help > ok to use the nebulizer up to every 4 hours but if start needing it regularly call for immediate appointment     03/09/2017  f/u ov/Cody Grant re:  Copd GOLD III // still smoking 7 cigs per day / not using stiolto consistently  Chief Complaint  Patient presents with   Follow-up    PFT's done today.  He states that he has good days and bad days. He is using  his proair  4 x per wk on average.    lot of cough/gagging worse at hs but usually sleeps/wakes ok  then after coffee starts back up  [facility] = can't walk a nl pace on a flat grade s sob but does fine slow and flat eg walking flat/slow  Only started stiolto a few days prior to OV  Since wanted to use up his spiriva  supply first  rec Plan A = Automatic = Stiolto 2 pffs each am  Plan B = Backup Only use your albuterol  as a rescue medication The key is to stop smoking completely before smoking completely stops you!       02/25/2021  f/u ov/Cody Grant re: GOLD 3 copd/ MS phenotype/ smoking still  maint on stiolto 2 daily   Chief Complaint  Patient presents with   Follow-up    Breathing is overall doing well. He has some cough and PND- cough is prod in the am with clear to yellow sputum.  He has not had to use his albuterol  inhaler in the past few wks.    Dyspnea:  walks dog twice daily x 1.5h / some hills  Cough: none  Sleeping: on a flat  bed / one pillow on side  SABA use: none  02: none  Covid status:  vax none / isolates  Rec Plan A = Automatic = Always=    stioto 2 puff each am  Plan B = Backup (to supplement plan A, not to replace it) Only use your albuterol  inhaler as a rescue medication Plan C = Crisis (instead of Plan B but only if Plan B stops working) - only use your albuterol  nebulizer if you first try Plan B  Plan D = Delatasone = prednisone    - take 6 days when ABC not working great  We will get you a new nebulizer machine    06/23/2022  f/u ov/Cody Grant re: GOLD 3/ MS/still smoking  maint on stiolto  with pred as plan D not needed  Chief Complaint  Patient presents with   Follow-up    Doing well.  Needs refill on Stiolto.  Dyspnea:  still walking dog  Cough: some am phlegm attributes to pnds  -clears p one hour Sleeping: usually flat/ on side / one pillow  SABA use: neb with acute flares / hfa once a month 02: none  Covid status:   vax none/ never infected ?not sure  Lung  cancer screening :  in program   Rec The key is to stop smoking completely before smoking completely stops you! Please schedule a follow up visit in 12 months but call sooner if needed    01/24/2023  f/u ov/Cody Grant re: GOLD 3/MS   maint on stiolto /  duoneb (not prn) worse since finished last pred taper  Chief Complaint  Patient presents with   COPD    Quit smoking 01/01/23; feels breathing is worse  Dyspnea:  trouble walking dog due to humidity x 200 ft slt [place]  Cough: min dry  Sleeping: flat bed on side s  resp cc p ambien   SABA use: not using much  02: none  Lung cancer screening :  see ct a 01/01/23 and in program already  Plan A = Automatic = Always=    stioto 2 puff each am  Plan B = Backup (to supplement plan A, not to replace it) Only use your albuterol  inhaler as a rescue medication Plan C = Crisis (instead of Plan B but only if Plan B stops working) - only use your albuterol  nebulizer if you first try Plan B  Also  Ok to try albuterol  15 min before an activity (on alternating days)  that you know would usually make you short of breath Plan D = Delatasone = prednisone    when ABC not working great - take 10 mg x 2 daily until better and not needing so much B and C then ok to take 1 daily x 5 days and stoP   11/07/2023  f/u ov/Cody Grant re: GOLD 3/MS    maint on STIOLTO  / LAST PRED one month pta Chief Complaint  Patient presents with   Follow-up    Pt is looking for results regarding his lung cancer screening.  Pt is concerned regarding pricing for his medication, and is interested in any other potential options that may be more affordable for him.  Dyspnea:  walking dog about the same  Cough: none but clearing throat  Sleeping: flat bed/ one pillow  resp cc  SABA use: occ hhfa/ no neb  02: none  Lung cancer screening :  07/06/23  RADS 2  with emphysema Rec Pantoprazole  (protonix ) 40 mg   Take  30-60 min before first meal of the day and Pepcid  (famotidine )  20 mg after supper until  return to office  GERD diet reviewed, bed blocks rec    02/25/2024  3 m f/u ov/Cody Grant re: GOLD 3 COPD    maint on spiriva  samples due to cost  of stiolto  Chief Complaint  Patient presents with   Medical Management of Chronic Issues   COPD    Ran out of Stiolto a few months ago. Has been using his spouse's Spiriva . Has had some wheezing in the am.   Dyspnea:  walking dog  ok / but finds himself walking slower/ shorter due to doe and finds wakes up in am's now with wheezing now that off stiolto but can't afford it.  Cough: none  Sleeping: flat bed/ one pillow s  resp cc  SABA use: occ hfa/ neb once every few days  02: none   Lung cancer screening :  q march  due    No obvious day to day or daytime variability or assoc excess/ purulent sputum or mucus plugs or hemoptysis or cp or chest tightness,  or overt sinus or hb symptoms.    Also denies any obvious fluctuation of symptoms with weather or environmental changes or other aggravating or alleviating factors except as outlined above   No unusual exposure hx or h/o childhood pna/ asthma or knowledge of premature birth.  Current Allergies, Complete Past Medical History, Past Surgical History, Family History, and Social History were reviewed in [employer] record.  ROS  The following are not active complaints unless bolded Hoarseness, sore throat, dysphagia, dental problems, itching, sneezing,  nasal congestion or discharge of excess mucus or purulent secretions, ear ache,   fever, chills, sweats, unintended wt loss or wt gain, classically pleuritic or exertional cp,  orthopnea pnd or arm/hand swelling  or leg swelling, presyncope, palpitations, abdominal pain, anorexia, nausea, vomiting, diarrhea  or change in bowel habits or change in bladder habits, change in stools or change in urine, dysuria, hematuria,  rash, arthralgias, visual complaints, headache, numbness, weakness or ataxia or problems with walking or  coordination,  change in mood or  memory.        Current Meds  Medication Sig   acetaminophen  (TYLENOL ) 325 MG tablet Take 650 mg by mouth every 6 (six) hours as needed for fever.   albuterol  (PROVENTIL ) (2.5 MG/3ML) 0.083% nebulizer solution Take 3 mLs (2.5 mg total) by nebulization every 6 (six) hours as needed for wheezing or shortness of breath.   albuterol  (VENTOLIN  HFA) 108 (90 Base) MCG/ACT inhaler Inhale 2 puffs into the lungs every 6 (six) hours as needed for wheezing or shortness of breath.   amLODipine  (NORVASC ) 10 MG tablet TAKE 1 TABLET(10 MG) BY MOUTH DAILY   guaiFENesin  (MUCINEX ) 600 MG 12 hr tablet Take 2 tablets (1,200 mg total) by mouth 2 (two) times daily.   NARCAN 4 MG/0.1ML LIQD nasal spray kit Place 0.4 mg into the nose as needed (accidental overdose).   Oxycodone  HCl 10 MG TABS Take 10 mg by mouth See admin instructions. Take 10 mg by mouth in the morning and midday- may take an additional 10 mg with supper as needed for pain   tiZANidine  (ZANAFLEX ) 4 MG tablet TAKE 1 TABLET(4 MG) BY MOUTH EVERY 8 HOURS AS NEEDED FOR MUSCLE SPASMS   Vitamin D, Ergocalciferol, (DRISDOL) 1.25 MG (50000 UNIT) CAPS capsule Take 50,000 Units by mouth every 7 (seven) days.   zolpidem  (AMBIEN )  10 MG tablet TAKE 1 TABLET(10 MG) BY MOUTH AT BEDTIME AS NEEDED FOR SLEEP           Objective:   Physical Exam  wts   02/25/2024       177   11/07/2023          178  01/24/2023        147 06/23/2022        157  02/25/2021      145 02/26/2020      144  04/10/2018        162  03/11/2018      161  09/06/2017          163   01/25/17 167 lb 6.4 oz (75.9 kg)  12/19/16 165 lb (74.8 kg)  08/02/16 167 lb 2 oz (75.8 kg)    Vital signs reviewed  02/25/2024  - Note at rest 02 sats  96% on RA   General appearance:    amb pleasant wm nad   HEENT : Oropharynx  clear   Nasal turbinates nl    NECK :  without  apparent JVD/ palpable Nodes/TM    LUNGS: no acc muscle use,  Mild barrel  contour chest wall  with bilateral  Distant bs s audible wheeze and  without cough on insp or exp maneuvers  and mild  Hyperresonant  to  percussion bilaterally     CV:  RRR  no s3 or murmur or increase in P2, and no edema   ABD:  soft and nontender   MS:  Nl gait/ ext warm without deformities Or obvious joint restrictions  calf tenderness, cyanosis or clubbing     SKIN: warm and dry without lesions    NEURO:  alert, approp, nl sensorium with  no motor or cerebellar deficits apparent.     Assessment:       Assessment & Plan COPD 3/ MS phenotype Quit smoking 01/01/23  PFT's   05/20/2013   FEV1 1.95 (54 % ) ratio 59  p 39 % improvement from saba p ? prior to study with DLCO  62 % corrects to 85  % for alv volume   - 01/25/2017    try stiolto 2 each am  - PFT's  03/09/2017  FEV1 1.65 (48 % ) ratio 54  p 1 % improvement from saba p stiolto  prior to study with DLCO  56/57 % corrects to 74 % for alv volume   - 03/11/2018 added prn prednisone  x 6 days for refractory symptoms - 03/11/2018  After extensive coaching inhaler device,  effectiveness =    90% with smi and hfa  - 04/10/2018   Walked RA  2 laps @ 226ft each @ avg pace  stopped due to end of study no desats -  [facility] AT screen 02/26/2020 >>>  MS   Level 148  - 11/07/2023  gerd rx for globus sensation/ dry daytime cough/ throat clearing  - 02/25/2024  After extensive coaching inhaler device,  effectiveness =    75% hfa/ symb 80 /spriiva (3 m sample)   Pt is Group B in terms of symptom/risk and laba/lama therefore appropriate rx at this point >>>  can't afford available lama/laba but already had supply of lama so rec adding symbicort  80 generic up to 2 bid as cheapest option since can't take powders at all    Discussed in detail all the  indications, usual  risks and alternatives  relative to the benefits with  patient who agrees to proceed with Rx as outlined.            Each maintenance medication was reviewed in detail including emphasizing most  importantly the difference between maintenance and prns and under what circumstances the prns are to be triggered using an action plan format where appropriate.  Total time for H and P, chart review, counseling, reviewing hfa/smi/neb device(s) and generating customized AVS unique to this office visit / same day charting = 30 min               AVS  Patient Instructions  Plan A = Automatic = Always=    symbicort  80 (breyna ) 2 puff each am chase by spriva x 2 puffs  and then ok to add another 2puffs of symbicort  12 hours later if needed for breathing.  Work on inhaler technique:  relax and gently blow all the way out then take a nice smooth full deep breath back in, triggering the inhaler at same time you start breathing in.  Hold breath in for at least  5 seconds if you can. Blow out symbicort   thru nose. Rinse and gargle with water when done.  If mouth or throat bother you at all,  try brushing teeth/gums/tongue with arm and hammer toothpaste/ make a slurry and gargle and spit out.      Plan B = Backup (to supplement plan A, not to replace it) Use your albuterol  inhaler as a rescue medication to be used if you can't catch your breath by resting or slowing your pace  or doing a relaxed purse lip breathing pattern.  - The less you use it, the better it will work when you need it. - Ok to use the inhaler up to 2 puffs  every 4 hours if you must but call for appointment if use goes up over your usual need - Don't leave home without it !!  (think of it like the spare tire or starter fluid for your car)   Plan C = Crisis (instead of Plan B but only if Plan B stops working) - only use your albuterol  nebulizer if you first try Plan B and it fails to help > ok to use the nebulizer up to every 4 hours but if start needing it regularly call for immediate appointment    Please schedule a follow up visit in 6 months but call sooner if needed    Ozell America, MD 02/25/2024

## 2024-02-26 ENCOUNTER — Other Ambulatory Visit (HOSPITAL_COMMUNITY): Payer: Self-pay

## 2024-02-26 ENCOUNTER — Telehealth: Payer: Self-pay

## 2024-02-26 NOTE — Telephone Encounter (Signed)
*  Pulm  Pharmacy Patient Advocate Encounter   Received notification from CoverMyMeds that prior authorization for Breyna  80-4.5MCG/ACT aerosol  is required/requested.   Insurance verification completed.   The patient is insured through [employer].   Per test claim:  Brand Symbicort  is preferred by the insurance.  If suggested medication is appropriate, Please send in a new RX and discontinue this one. If not, please advise as to why it's not appropriate so that we may request a Prior Authorization. Please note, some preferred medications may still require a PA.  If the suggested medications have not been trialed and there are no contraindications to their use, the PA will not be submitted, as it will not be approved.

## 2024-02-26 NOTE — Telephone Encounter (Addendum)
 Per chart note from OV on 02/25/2024: Dr Darlean:     Patient Instructions  Plan A = Automatic = Always=    symbicort  80 (breyna ) 2 puff each am chase by spriva x 2 puffs  and then ok to add another 2puffs of symbicort  12 hours later if needed for breathing.   OK to use brand Symbicort .  Will send in a new rx and discontinue generic/Breyna .

## 2024-05-07 ENCOUNTER — Other Ambulatory Visit: Payer: Self-pay | Admitting: Internal Medicine

## 2024-05-07 NOTE — Telephone Encounter (Signed)
 Pt requesting refill of pantoprazole , not mentioned in LOV notes to continue or d/c. Please advise, thank you!
